# Patient Record
Sex: Female | Born: 1945 | Race: Black or African American | Hispanic: No | Smoking: Never smoker
Health system: Southern US, Community
[De-identification: ages and names within clinical notes are randomized; demographics above are authoritative.]

## PROBLEM LIST (undated history)

## (undated) DIAGNOSIS — H539 Unspecified visual disturbance: Secondary | ICD-10-CM

## (undated) DIAGNOSIS — R51 Headache: Secondary | ICD-10-CM

## (undated) DIAGNOSIS — M8580 Other specified disorders of bone density and structure, unspecified site: Secondary | ICD-10-CM

## (undated) DIAGNOSIS — R519 Headache, unspecified: Secondary | ICD-10-CM

## (undated) DIAGNOSIS — C50919 Malignant neoplasm of unspecified site of unspecified female breast: Secondary | ICD-10-CM

## (undated) DIAGNOSIS — E785 Hyperlipidemia, unspecified: Secondary | ICD-10-CM

## (undated) DIAGNOSIS — Z8489 Family history of other specified conditions: Secondary | ICD-10-CM

## (undated) DIAGNOSIS — M1990 Unspecified osteoarthritis, unspecified site: Secondary | ICD-10-CM

## (undated) HISTORY — PX: TRIGGER FINGER RELEASE: SHX641

## (undated) HISTORY — PX: MASTECTOMY: SHX3

## (undated) HISTORY — DX: Headache: R51

## (undated) HISTORY — PX: BACK SURGERY: SHX140

## (undated) HISTORY — PX: TUBAL LIGATION: SHX77

## (undated) HISTORY — DX: Unspecified visual disturbance: H53.9

## (undated) HISTORY — DX: Headache, unspecified: R51.9

## (undated) HISTORY — PX: BUNIONECTOMY: SHX129

## (undated) HISTORY — DX: Malignant neoplasm of unspecified site of unspecified female breast: C50.919

---

## 1998-01-18 ENCOUNTER — Observation Stay (HOSPITAL_COMMUNITY): Admission: RE | Admit: 1998-01-18 | Discharge: 1998-01-19 | Payer: Self-pay | Admitting: Orthopedic Surgery

## 1998-10-03 ENCOUNTER — Other Ambulatory Visit: Admission: RE | Admit: 1998-10-03 | Discharge: 1998-10-03 | Payer: Self-pay | Admitting: Obstetrics and Gynecology

## 1999-07-02 ENCOUNTER — Encounter: Admission: RE | Admit: 1999-07-02 | Discharge: 1999-07-02 | Payer: Self-pay | Admitting: Endocrinology

## 1999-07-02 ENCOUNTER — Encounter: Payer: Self-pay | Admitting: Endocrinology

## 1999-10-31 ENCOUNTER — Encounter (INDEPENDENT_AMBULATORY_CARE_PROVIDER_SITE_OTHER): Payer: Self-pay | Admitting: Specialist

## 1999-10-31 ENCOUNTER — Other Ambulatory Visit: Admission: RE | Admit: 1999-10-31 | Discharge: 1999-10-31 | Payer: Self-pay | Admitting: Obstetrics and Gynecology

## 2000-01-07 ENCOUNTER — Ambulatory Visit (HOSPITAL_COMMUNITY): Admission: RE | Admit: 2000-01-07 | Discharge: 2000-01-07 | Payer: Self-pay | Admitting: *Deleted

## 2000-07-02 ENCOUNTER — Encounter: Admission: RE | Admit: 2000-07-02 | Discharge: 2000-07-02 | Payer: Self-pay | Admitting: Endocrinology

## 2000-07-02 ENCOUNTER — Encounter: Payer: Self-pay | Admitting: Endocrinology

## 2000-12-01 ENCOUNTER — Other Ambulatory Visit: Admission: RE | Admit: 2000-12-01 | Discharge: 2000-12-01 | Payer: Self-pay | Admitting: Obstetrics and Gynecology

## 2001-07-05 ENCOUNTER — Encounter: Payer: Self-pay | Admitting: Endocrinology

## 2001-07-05 ENCOUNTER — Encounter: Admission: RE | Admit: 2001-07-05 | Discharge: 2001-07-05 | Payer: Self-pay | Admitting: Endocrinology

## 2002-05-11 ENCOUNTER — Encounter: Admission: RE | Admit: 2002-05-11 | Discharge: 2002-05-11 | Payer: Self-pay | Admitting: Endocrinology

## 2002-05-11 ENCOUNTER — Encounter: Payer: Self-pay | Admitting: Endocrinology

## 2002-07-07 ENCOUNTER — Encounter: Admission: RE | Admit: 2002-07-07 | Discharge: 2002-07-07 | Payer: Self-pay | Admitting: Obstetrics and Gynecology

## 2002-07-07 ENCOUNTER — Encounter: Payer: Self-pay | Admitting: Obstetrics and Gynecology

## 2002-07-14 ENCOUNTER — Encounter: Admission: RE | Admit: 2002-07-14 | Discharge: 2002-07-14 | Payer: Self-pay | Admitting: Obstetrics and Gynecology

## 2002-07-14 ENCOUNTER — Encounter: Payer: Self-pay | Admitting: Obstetrics and Gynecology

## 2003-07-13 ENCOUNTER — Encounter: Admission: RE | Admit: 2003-07-13 | Discharge: 2003-07-13 | Payer: Self-pay | Admitting: Obstetrics and Gynecology

## 2004-07-17 ENCOUNTER — Encounter: Admission: RE | Admit: 2004-07-17 | Discharge: 2004-07-17 | Payer: Self-pay | Admitting: Specialist

## 2005-06-05 ENCOUNTER — Ambulatory Visit (HOSPITAL_COMMUNITY): Admission: RE | Admit: 2005-06-05 | Discharge: 2005-06-05 | Payer: Self-pay | Admitting: *Deleted

## 2005-07-20 ENCOUNTER — Encounter: Admission: RE | Admit: 2005-07-20 | Discharge: 2005-07-20 | Payer: Self-pay | Admitting: Endocrinology

## 2006-07-23 ENCOUNTER — Encounter: Admission: RE | Admit: 2006-07-23 | Discharge: 2006-07-23 | Payer: Self-pay | Admitting: Family Medicine

## 2007-08-25 ENCOUNTER — Encounter: Admission: RE | Admit: 2007-08-25 | Discharge: 2007-08-25 | Payer: Self-pay | Admitting: Internal Medicine

## 2007-09-01 ENCOUNTER — Encounter: Admission: RE | Admit: 2007-09-01 | Discharge: 2007-09-01 | Payer: Self-pay | Admitting: Infectious Diseases

## 2008-10-05 ENCOUNTER — Encounter: Admission: RE | Admit: 2008-10-05 | Discharge: 2008-10-05 | Payer: Self-pay | Admitting: Infectious Diseases

## 2009-11-07 ENCOUNTER — Encounter: Admission: RE | Admit: 2009-11-07 | Discharge: 2009-11-07 | Payer: Self-pay | Admitting: Specialist

## 2010-02-23 ENCOUNTER — Encounter: Payer: Self-pay | Admitting: Endocrinology

## 2010-02-24 ENCOUNTER — Encounter: Payer: Self-pay | Admitting: Infectious Diseases

## 2010-06-20 NOTE — Op Note (Signed)
[name], [name]                ACCOUNT NO.:  [id number]   MEDICAL RECORD NO.:  [id number]          PATIENT TYPE:  AMB   LOCATION:  ENDO                         FACILITY:  [facility]   PHYSICIAN:  Georgiana Spinner, M.D.    DATE OF BIRTH:  12-08-1945   DATE OF PROCEDURE:  06/05/2005  DATE OF DISCHARGE:                                 OPERATIVE REPORT   PROCEDURE:  Colonoscopy.   INDICATIONS:  Colon cancer screening.   ANESTHESIA:  Demerol 100, Versed 9 mg.   PROCEDURE:  With the patient mildly sedated in the left lateral decubitus  position, the Olympus videoscopic colonoscope CF-160AL was inserted into the  rectum and passed under direct vision to the ascending colon. I could  advance it no further despite pressure and turning the patient so it was  withdrawn taking circumferential views of the colonic mucosa stopping in the  rectum which appeared normal on direct and retroflexed view. The endoscope  was then withdrawn and a PCF-160 pediatric endoscope was inserted and got to  the same point, again with pressure applied and the patient turned to her  back. This again was withdrawn taking circumferential views of the colonic  mucosa until we withdrew but I tried once more with the adult scope and with  pressure applied and patient turned, we were able to reach the cecum  identified by the ileocecal valve and appendiceal orifice both of which were  photographed. From this point, the colonoscope was again slowly withdrawn  taking circumferential views of the colonic mucosa stopping only in the  rectum which again appeared normal on direct view. I did not do another  retroflexed view. The endoscope was straightened and withdrawn.  The  patient's vital signs and pulse oximeter remained stable.  The patient  tolerated the procedure well without apparent complications.   FINDINGS:  Unremarkable examination of a very tortuous colon.   PLAN:  Consider repeat examination in 5-10 years.          ______________________________  Georgiana Spinner, M.D.     GMO/MEDQ  D:  06/05/2005  T:  06/06/2005  Job:  [id number]

## 2010-11-24 ENCOUNTER — Other Ambulatory Visit: Payer: Self-pay | Admitting: Specialist

## 2010-11-24 DIAGNOSIS — Z1231 Encounter for screening mammogram for malignant neoplasm of breast: Secondary | ICD-10-CM

## 2010-12-16 ENCOUNTER — Ambulatory Visit
Admission: RE | Admit: 2010-12-16 | Discharge: 2010-12-16 | Disposition: A | Discharge status: Home / Self Care | Payer: BC Managed Care – PPO | Source: Ambulatory Visit | Attending: Specialist | Admitting: Specialist

## 2010-12-16 ENCOUNTER — Other Ambulatory Visit: Payer: Self-pay | Admitting: Specialist

## 2010-12-16 DIAGNOSIS — Z1231 Encounter for screening mammogram for malignant neoplasm of breast: Secondary | ICD-10-CM

## 2011-11-26 ENCOUNTER — Other Ambulatory Visit: Payer: Self-pay | Admitting: Specialist

## 2011-11-26 DIAGNOSIS — Z1231 Encounter for screening mammogram for malignant neoplasm of breast: Secondary | ICD-10-CM

## 2011-11-26 DIAGNOSIS — Z9012 Acquired absence of left breast and nipple: Secondary | ICD-10-CM

## 2011-12-14 ENCOUNTER — Ambulatory Visit
Admission: RE | Admit: 2011-12-14 | Discharge: 2011-12-14 | Disposition: A | Discharge status: Home / Self Care | Payer: BC Managed Care – PPO | Source: Ambulatory Visit | Attending: Specialist | Admitting: Specialist

## 2011-12-14 DIAGNOSIS — Z9012 Acquired absence of left breast and nipple: Secondary | ICD-10-CM

## 2011-12-14 DIAGNOSIS — Z1231 Encounter for screening mammogram for malignant neoplasm of breast: Secondary | ICD-10-CM

## 2011-12-30 ENCOUNTER — Ambulatory Visit: Payer: BC Managed Care – PPO

## 2012-11-09 ENCOUNTER — Other Ambulatory Visit: Payer: Self-pay

## 2012-11-09 DIAGNOSIS — Z1231 Encounter for screening mammogram for malignant neoplasm of breast: Secondary | ICD-10-CM

## 2012-12-14 ENCOUNTER — Ambulatory Visit
Admission: RE | Admit: 2012-12-14 | Discharge: 2012-12-14 | Disposition: A | Discharge status: Home / Self Care | Payer: BC Managed Care – PPO | Source: Ambulatory Visit

## 2012-12-14 DIAGNOSIS — Z1231 Encounter for screening mammogram for malignant neoplasm of breast: Secondary | ICD-10-CM

## 2013-11-14 ENCOUNTER — Other Ambulatory Visit: Payer: Self-pay

## 2013-11-14 DIAGNOSIS — Z9012 Acquired absence of left breast and nipple: Secondary | ICD-10-CM

## 2013-11-14 DIAGNOSIS — Z1231 Encounter for screening mammogram for malignant neoplasm of breast: Secondary | ICD-10-CM

## 2013-12-18 ENCOUNTER — Encounter (INDEPENDENT_AMBULATORY_CARE_PROVIDER_SITE_OTHER): Payer: Self-pay

## 2013-12-18 ENCOUNTER — Ambulatory Visit
Admission: RE | Admit: 2013-12-18 | Discharge: 2013-12-18 | Disposition: A | Discharge status: Home / Self Care | Payer: Medicare Other | Source: Ambulatory Visit

## 2013-12-18 DIAGNOSIS — Z9012 Acquired absence of left breast and nipple: Secondary | ICD-10-CM

## 2013-12-18 DIAGNOSIS — Z1231 Encounter for screening mammogram for malignant neoplasm of breast: Secondary | ICD-10-CM

## 2014-10-10 ENCOUNTER — Other Ambulatory Visit: Payer: Self-pay | Admitting: Occupational Medicine

## 2014-10-10 ENCOUNTER — Ambulatory Visit: Payer: Self-pay

## 2014-10-10 DIAGNOSIS — M25512 Pain in left shoulder: Secondary | ICD-10-CM

## 2014-12-17 ENCOUNTER — Other Ambulatory Visit: Payer: Self-pay

## 2014-12-17 DIAGNOSIS — Z9012 Acquired absence of left breast and nipple: Secondary | ICD-10-CM

## 2014-12-17 DIAGNOSIS — Z1231 Encounter for screening mammogram for malignant neoplasm of breast: Secondary | ICD-10-CM

## 2014-12-20 ENCOUNTER — Ambulatory Visit
Admission: RE | Admit: 2014-12-20 | Discharge: 2014-12-20 | Disposition: A | Discharge status: Home / Self Care | Payer: BC Managed Care – PPO | Source: Ambulatory Visit

## 2014-12-20 DIAGNOSIS — Z9012 Acquired absence of left breast and nipple: Secondary | ICD-10-CM

## 2014-12-20 DIAGNOSIS — Z1231 Encounter for screening mammogram for malignant neoplasm of breast: Secondary | ICD-10-CM

## 2015-12-02 ENCOUNTER — Other Ambulatory Visit: Payer: Self-pay | Admitting: Endocrinology

## 2015-12-02 DIAGNOSIS — Z1231 Encounter for screening mammogram for malignant neoplasm of breast: Secondary | ICD-10-CM

## 2015-12-11 ENCOUNTER — Encounter (INDEPENDENT_AMBULATORY_CARE_PROVIDER_SITE_OTHER): Payer: Self-pay | Admitting: Family

## 2015-12-11 ENCOUNTER — Ambulatory Visit (INDEPENDENT_AMBULATORY_CARE_PROVIDER_SITE_OTHER): Payer: BC Managed Care – PPO | Admitting: Family

## 2015-12-11 ENCOUNTER — Telehealth (INDEPENDENT_AMBULATORY_CARE_PROVIDER_SITE_OTHER): Payer: Self-pay | Admitting: *Deleted

## 2015-12-11 ENCOUNTER — Ambulatory Visit (INDEPENDENT_AMBULATORY_CARE_PROVIDER_SITE_OTHER): Payer: BC Managed Care – PPO

## 2015-12-11 VITALS — Ht 66.0 in | Wt 200.0 lb

## 2015-12-11 DIAGNOSIS — M545 Low back pain, unspecified: Secondary | ICD-10-CM

## 2015-12-11 MED ORDER — PREDNISONE 10 MG (21) PO TBPK: 10.0000 mg | ORAL_TABLET | Freq: Every day | ORAL | 0 refills | Status: DC

## 2015-12-11 NOTE — Telephone Encounter (Signed)
Prescription was supposed to be sent to [facility] [place].

## 2015-12-11 NOTE — Progress Notes (Signed)
   Office Visit Note   Patient: Anna [name]           Date of Birth: 1945/10/26           MRN: [id number] Visit Date: 12/11/2015              Requested by: No referring provider defined for this encounter. PCP: No primary care provider on file.   Assessment & Plan: Visit Diagnoses:  1. Acute midline low back pain without sciatica     Plan: Have recommended she discontinue the ibuprofen and aleve as she recently had to start taking nexium due to increased acid reflux. Have called in a prescription for prednisone which she will take. She will follow up in the office in 4 more weeks. May use Tylenol for when necessary for pain.  Follow-Up Instructions: Return in about 4 weeks (around 01/08/2016).   Orders:  Orders Placed This Encounter  Procedures  . XR Lumbar Spine 2-3 Views   Meds ordered this encounter  Medications  . predniSONE (STERAPRED UNI-PAK 21 TAB) 10 MG (21) TBPK tablet    Sig: Take 1 tablet (10 mg total) by mouth daily.    Dispense:  21 tablet    Refill:  0      Procedures: No procedures performed   Clinical Data: No additional findings.   Subjective: Chief Complaint  Patient presents with  . Lower Back - Pain    X 1 day without injury    Patient is worked in today with complaints of acute onset of back pain. This is midline and does not radiate down the extremities. She doe snot report any known injury " it just started" and she is now having difficulty with walking due to the pain  Has been more active recently, and walks and does weights at the [facility]. No known injury. Does report mid back pain and describes this as a throbbing, radiates around the left abdomen and lower rib area. Denies any numbness or tingling.  Review of Systems  Constitutional: Negative for chills and fever.  Musculoskeletal: Positive for back pain and myalgias. Negative for gait problem.  All other systems reviewed and are negative.    Objective: Vital Signs: Ht 5\' 6"   (1.676 m)   Wt 200 lb (90.7 kg)   BMI 32.28 kg/m   Physical Exam  Musculoskeletal:       Thoracic back: She exhibits no bony tenderness and no spasm.       Lumbar back: Normal. She exhibits no bony tenderness and no spasm.  Negative straight leg raise bilaterally.   Physical Exam  Constitutional: He is oriented to person, place, and time. He appears well-developed and well-nourished.  Pulmonary/Chest: Effort normal.  Neurological: He is alert and oriented to person, place, and time.  Psychiatric: He has a normal mood and affect.  Nursing note reviewed.   Ortho Exam  Specialty Comments:  No specialty comments available.  Imaging: No results found.   PMFS History: There are no active problems to display for this patient.  No past medical history on file.  No family history on file.  No past surgical history on file. Social History   Occupational History  . Not on file.   Social History Main Topics  . Smoking status: Never Smoker  . Smokeless tobacco: Never Used  . Alcohol use Not on file  . Drug use: Unknown  . Sexual activity: Not on file

## 2015-12-12 ENCOUNTER — Telehealth (INDEPENDENT_AMBULATORY_CARE_PROVIDER_SITE_OTHER): Payer: Self-pay | Admitting: Orthopedic Surgery

## 2015-12-12 MED ORDER — PREDNISONE 10 MG (21) PO TBPK
10.0000 mg | ORAL_TABLET | Freq: Every day | ORAL | 0 refills | Status: DC
Start: 2015-12-12 — End: 2017-03-29

## 2015-12-12 NOTE — Telephone Encounter (Signed)
The pharmacy has transferred her prescription. Patient picked up yesterday.

## 2015-12-12 NOTE — Telephone Encounter (Signed)
Loren with [employer] called needing form faxed back to this fax number [phone]    Ph# [phone]    Please add order number on form  [id number]

## 2016-01-03 NOTE — Telephone Encounter (Signed)
This is complete and will be faxed to day.

## 2016-01-07 ENCOUNTER — Ambulatory Visit
Admission: RE | Admit: 2016-01-07 | Discharge: 2016-01-07 | Disposition: A | Discharge status: Home / Self Care | Payer: Medicare Other | Source: Ambulatory Visit | Attending: Endocrinology | Admitting: Endocrinology

## 2016-01-07 DIAGNOSIS — Z1231 Encounter for screening mammogram for malignant neoplasm of breast: Secondary | ICD-10-CM

## 2016-01-08 ENCOUNTER — Encounter (INDEPENDENT_AMBULATORY_CARE_PROVIDER_SITE_OTHER): Payer: Self-pay | Admitting: Family

## 2016-01-08 ENCOUNTER — Ambulatory Visit (INDEPENDENT_AMBULATORY_CARE_PROVIDER_SITE_OTHER): Payer: BC Managed Care – PPO | Admitting: Family

## 2016-01-08 VITALS — Ht 66.0 in | Wt 200.0 lb

## 2016-01-08 DIAGNOSIS — M546 Pain in thoracic spine: Secondary | ICD-10-CM

## 2016-01-08 NOTE — Progress Notes (Signed)
   Office Visit Note   Patient: Anna [name]           Date of Birth: 1945-11-22           MRN: [id number] Visit Date: 01/08/2016              Requested by: No referring provider defined for this encounter. PCP: No PCP Per Patient   Assessment & Plan: Visit Diagnoses:  1. Acute left-sided low back pain, with sciatica presence unspecified     Plan: Will return to the [facility]. Continue with home exercise program. She'll follow up with [place] as needed.  Follow-Up Instructions: Return if symptoms worsen or fail to improve.   Orders:  No orders of the defined types were placed in this encounter.  No orders of the defined types were placed in this encounter.     Procedures: No procedures performed   Clinical Data: No additional findings.   Subjective: Chief Complaint  Patient presents with  . Middle Back - Pain    Patient is a 70 year old woman who comes in today for follow up of " mid back pain." she states that the prednisone taper has worked well for her and she is no longer in pain. [facility]    Review of Systems  Constitutional: Negative for chills and fever.     Objective: Vital Signs: Ht 5\' 6"  (1.676 m)   Wt 200 lb (90.7 kg)   BMI 32.28 kg/m   Physical Exam  Constitutional: She is oriented to person, place, and time. She appears well-developed and well-nourished.  Pulmonary/Chest: Effort normal.  Musculoskeletal:       Thoracic back: She exhibits normal range of motion, no tenderness and no pain.  Neurological: She is alert and oriented to person, place, and time.  Psychiatric: She has a normal mood and affect.  Nursing note reviewed.   Ortho Exam  Specialty Comments:  No specialty comments available.  Imaging: No results found.   PMFS History: There are no active problems to display for this patient.  No past medical history on file.  No family history on file.  No past surgical history on  file. Social History   Occupational History  . Not on file.   Social History Main Topics  . Smoking status: Never Smoker  . Smokeless tobacco: Never Used  . Alcohol use Not on file  . Drug use: Unknown  . Sexual activity: Not on file

## 2016-01-10 ENCOUNTER — Telehealth (INDEPENDENT_AMBULATORY_CARE_PROVIDER_SITE_OTHER): Payer: Self-pay | Admitting: Orthopedic Surgery

## 2016-01-10 NOTE — Telephone Encounter (Signed)
Lauren from [employer] called checking that status of request for functional capacity eval and return to work note, says she has been waiting for 3 months for this.  Order #: [id number]  Cb#: [phone]

## 2016-01-13 ENCOUNTER — Telehealth (INDEPENDENT_AMBULATORY_CARE_PROVIDER_SITE_OTHER): Payer: Self-pay | Admitting: *Deleted

## 2016-01-13 NOTE — Telephone Encounter (Signed)
[employer] called again. Stated they have called multiple times and it has been over 90 days since they intially called and have not gotten a call back for pts disability.   reference number [id number]

## 2016-01-14 NOTE — Telephone Encounter (Signed)
I called this morning and let them know that the attending physicians statement had been faxed that we had not advised the pt to be out of work that we did not order an FCE and she had received an [employer] rating 10/01/15 with restrictions of no over head limiting of 25lbs. They stated they the office did not get this. I advised that I will fax this one note again for the work comp injury. To call with questions

## 2016-07-11 ENCOUNTER — Ambulatory Visit (HOSPITAL_COMMUNITY)
Admission: EM | Admit: 2016-07-11 | Discharge: 2016-07-11 | Disposition: A | Discharge status: Home / Self Care | Payer: Medicare Other | Attending: Internal Medicine | Admitting: Internal Medicine

## 2016-07-11 ENCOUNTER — Encounter (HOSPITAL_COMMUNITY): Payer: Self-pay | Admitting: Family Medicine

## 2016-07-11 DIAGNOSIS — S61211A Laceration without foreign body of left index finger without damage to nail, initial encounter: Secondary | ICD-10-CM

## 2016-07-11 DIAGNOSIS — Z23 Encounter for immunization: Secondary | ICD-10-CM | POA: Diagnosis not present

## 2016-07-11 DIAGNOSIS — W260XXA Contact with knife, initial encounter: Secondary | ICD-10-CM | POA: Diagnosis not present

## 2016-07-11 MED ORDER — TETANUS-DIPHTH-ACELL PERTUSSIS 5-2.5-18.5 LF-MCG/0.5 IM SUSP
0.5000 mL | Freq: Once | INTRAMUSCULAR | Status: AC
  Administered 2016-07-11: 0.5 mL via INTRAMUSCULAR

## 2016-07-11 MED ORDER — TETANUS-DIPHTH-ACELL PERTUSSIS 5-2.5-18.5 LF-MCG/0.5 IM SUSP
INTRAMUSCULAR | Status: AC
  Filled 2016-07-11: qty 0.5

## 2016-07-11 NOTE — ED Triage Notes (Signed)
Pt here for laceration to left index finger. sts she cut it this am cutting potatoes.

## 2016-07-11 NOTE — ED Provider Notes (Signed)
CSN: [id number]     Arrival date & time 07/11/16  1251 History   First MD Initiated Contact with Patient 07/11/16 1421     Chief Complaint  Patient presents with  . Laceration   (Consider location/radiation/quality/duration/timing/severity/associated sxs/prior Treatment) The history is provided by the patient.  Laceration  Location:  Finger Finger laceration location:  L index finger Length:  28mm Depth:  Cutaneous Quality: straight   Bleeding: controlled   Time since incident:  2 hours Laceration mechanism:  Knife Pain details:    Severity:  No pain Foreign body present:  No foreign bodies Tetanus status:  Unknown Associated symptoms: no fever, no focal weakness, no numbness, no rash, no redness, no swelling and no streaking     History reviewed. No pertinent past medical history. History reviewed. No pertinent surgical history. History reviewed. No pertinent family history. Social History  Substance Use Topics  . Smoking status: Never Smoker  . Smokeless tobacco: Never Used  . Alcohol use Not on file   OB History    No data available     Review of Systems  Constitutional: Negative for fever.       As stated in the HPI  Skin: Negative for rash.  Neurological: Negative for focal weakness.    Allergies  Sulfa antibiotics  Home Medications   Prior to Admission medications   Medication Sig Start Date End Date Taking? Authorizing Provider  esomeprazole (NEXIUM) 40 MG capsule Take 40 mg by mouth daily at 12 noon.    [provider]  levothyroxine (SYNTHROID, LEVOTHROID) 88 MCG tablet Take 88 mcg by mouth daily before breakfast.    [provider]  omeprazole (PRILOSEC) 20 MG capsule Take 20 mg by mouth daily.    [provider]  predniSONE (STERAPRED UNI-PAK 21 TAB) 10 MG (21) TBPK tablet Take 1 tablet (10 mg total) by mouth daily. Patient not taking: Reported on 01/08/2016 12/12/15   Suzan Slick, NP   Meds Ordered and Administered this  Visit   Medications  Tdap (BOOSTRIX) injection 0.5 mL (not administered)    BP (!) 102/59   Pulse 67   Temp 97.9 F (36.6 C)   Resp 18   SpO2 95%  No data found.   Physical Exam  Constitutional: She is oriented to person, place, and time. She appears well-developed and well-nourished.  Cardiovascular: Normal rate.   Pulmonary/Chest: Effort normal.  Neurological: She is alert and oriented to person, place, and time.  Skin:  Has a 8 mm linear superficial laceration to the left index finger. Bleeding controlled.   Psychiatric: She has a normal mood and affect.  Nursing note and vitals reviewed.   Urgent Care Course     .Marland KitchenLaceration Repair Date/Time: 07/11/2016 2:33 PM Performed by: Barry Dienes Authorized by: Sherlene Shams   Consent:    Consent obtained:  Verbal   Consent given by:  Patient   Risks discussed:  Need for additional repair and infection Anesthesia (see MAR for exact dosages):    Anesthesia method:  None Laceration details:    Location:  Finger   Finger location:  L index finger   Wound length (cm): 8 mm.   Laceration depth: 1 mm. Repair type:    Repair type:  Simple Pre-procedure details:    Preparation:  Patient was prepped and draped in usual sterile fashion Exploration:    Hemostasis achieved with:  Direct pressure   Wound exploration: wound explored through full range of motion  Wound extent: no foreign bodies/material noted, no muscle damage noted, no nerve damage noted and no vascular damage noted     Contaminated: no   Treatment:    Wound cleansed with: dermal wound cleanser.   Amount of cleaning:  Standard Skin repair:    Repair method:  Tissue adhesive Approximation:    Approximation:  Close   Vermilion border: well-aligned   Post-procedure details:    Dressing:  Adhesive bandage   Patient tolerance of procedure:  Tolerated well, no immediate complications   (including critical care time)  Labs Review Labs Reviewed - No data  to display  Imaging Review No results found.  MDM   1. Laceration of left index finger without foreign body without damage to nail, initial encounter    Laceration repaired: See procedure note above Tetanus booster given today Wound care discussed.      Barry Dienes, NP 07/11/16 1435

## 2016-08-13 ENCOUNTER — Ambulatory Visit (INDEPENDENT_AMBULATORY_CARE_PROVIDER_SITE_OTHER): Payer: Medicare Other | Admitting: Orthopedic Surgery

## 2016-08-13 ENCOUNTER — Ambulatory Visit (INDEPENDENT_AMBULATORY_CARE_PROVIDER_SITE_OTHER): Payer: Medicare Other

## 2016-08-13 ENCOUNTER — Encounter (INDEPENDENT_AMBULATORY_CARE_PROVIDER_SITE_OTHER): Payer: Self-pay | Admitting: Orthopedic Surgery

## 2016-08-13 VITALS — Ht 66.0 in | Wt 200.0 lb

## 2016-08-13 DIAGNOSIS — M79645 Pain in left finger(s): Secondary | ICD-10-CM

## 2016-08-13 DIAGNOSIS — M79672 Pain in left foot: Secondary | ICD-10-CM | POA: Diagnosis not present

## 2016-08-13 DIAGNOSIS — G8929 Other chronic pain: Secondary | ICD-10-CM | POA: Diagnosis not present

## 2016-08-13 DIAGNOSIS — M25551 Pain in right hip: Secondary | ICD-10-CM | POA: Diagnosis not present

## 2016-08-13 NOTE — Progress Notes (Signed)
Office Visit Note   Patient: Anna [name]           Date of Birth: [birth year]           MRN: [id number] Visit Date: 08/13/2016              Requested by: Anda Kraft, MD [address] [place], [place] [address] PCP: Anda Kraft, MD  Chief Complaint  Patient presents with  . Left Foot - Pain  . Left Thumb - Pain  . Right Hip - Pain  . Left Knee - Pain  . Right Knee - Pain  . Left Shoulder - Pain      HPI: Patient is a 71 year old woman who presents complaining of right hip groin pain worse with weightbearing pain here is anteriorly as well as posteriorly with movement. Patient states that she has had bilateral knee pain as well worsen the left than the right. Patient states that she still has shoulder pain status post arthroscopic debridement. Patient is having pain in the carpometacarpal joint of her left thumb and having pain in the left foot status post bunion surgery.  Assessment & Plan: Visit Diagnoses:  1. Pain in right hip   2. Pain in left foot   3. Chronic pain of left thumb     Plan: Recommended over-the-counter anti-inflammatories either ibuprofen 3 times a day or Aleve twice a day. Recommended exercise including strengthening and aerobic exercises such as swimming and bicycling to improve the strength in her lower extremities. Discussed the possibility of total knee arthroplasty for hip pain worsens.  Follow-Up Instructions: Return if symptoms worsen or fail to improve.   Ortho Exam  Patient is alert, oriented, no adenopathy, well-dressed, normal affect, normal respiratory effort. Examination patient has an antalgic gait. She has external rotation of 30 internal rotation of 10 of the right hip she has a negative straight leg raise. She has pain to palpation carpometacarpal joint left thumb. There is no redness no cellulitis no signs of infection no signs of gout. Examination she has decreased range of motion of the MTP joint of the left great  toe. There is no signs of infection.  Imaging: Xr Finger Thumb Left  Result Date: 08/13/2016 Three-view radiographs of the left hand shows joint space narrowing at the carpometacarpal joint left thumb.  Xr Foot Complete Left  Result Date: 08/13/2016 3 view radiographs left foot shows arthritic joint space narrowing of the MTP joint of the great toe with subcondylar sclerosis status post a silver bunionectomy.  Xr Hip Unilat W Or W/o Pelvis 2-3 Views Right  Result Date: 08/13/2016 Two-view radiographs of the right hip shows subcondylar sclerosis joint space narrowing irregular joint space with subcondylar cysts.   Labs: No results found for: HGBA1C, ESRSEDRATE, CRP, LABURIC, REPTSTATUS, GRAMSTAIN, CULT, LABORGA  Orders:  Orders Placed This Encounter  Procedures  . XR HIP UNILAT W OR W/O PELVIS 2-3 VIEWS RIGHT  . XR Foot Complete Left  . XR Finger Thumb Left   No orders of the defined types were placed in this encounter.    Procedures: No procedures performed  Clinical Data: No additional findings.  ROS:  All other systems negative, except as noted in the HPI. Review of Systems  Objective: Vital Signs: Ht 5\' 6"  (1.676 m)   Wt 200 lb (90.7 kg)   BMI 32.28 kg/m   Specialty Comments:  No specialty comments available.  PMFS History: There are no active problems to display for this  patient.  History reviewed. No pertinent past medical history.  History reviewed. No pertinent family history.  History reviewed. No pertinent surgical history. Social History   Occupational History  . Not on file.   Social History Main Topics  . Smoking status: Never Smoker  . Smokeless tobacco: Never Used  . Alcohol use Not on file  . Drug use: Unknown  . Sexual activity: Not on file

## 2016-09-21 DIAGNOSIS — E559 Vitamin D deficiency, unspecified: Secondary | ICD-10-CM | POA: Diagnosis not present

## 2016-09-21 DIAGNOSIS — E7800 Pure hypercholesterolemia, unspecified: Secondary | ICD-10-CM | POA: Diagnosis not present

## 2016-09-22 DIAGNOSIS — E039 Hypothyroidism, unspecified: Secondary | ICD-10-CM | POA: Diagnosis not present

## 2016-09-22 DIAGNOSIS — D509 Iron deficiency anemia, unspecified: Secondary | ICD-10-CM | POA: Diagnosis not present

## 2016-09-22 DIAGNOSIS — M859 Disorder of bone density and structure, unspecified: Secondary | ICD-10-CM | POA: Diagnosis not present

## 2016-09-22 DIAGNOSIS — E7800 Pure hypercholesterolemia, unspecified: Secondary | ICD-10-CM | POA: Diagnosis not present

## 2016-09-22 DIAGNOSIS — M8580 Other specified disorders of bone density and structure, unspecified site: Secondary | ICD-10-CM | POA: Diagnosis not present

## 2016-10-16 DIAGNOSIS — H524 Presbyopia: Secondary | ICD-10-CM | POA: Diagnosis not present

## 2016-10-16 DIAGNOSIS — H5203 Hypermetropia, bilateral: Secondary | ICD-10-CM | POA: Diagnosis not present

## 2016-10-16 DIAGNOSIS — H2513 Age-related nuclear cataract, bilateral: Secondary | ICD-10-CM | POA: Diagnosis not present

## 2016-10-16 DIAGNOSIS — H43813 Vitreous degeneration, bilateral: Secondary | ICD-10-CM | POA: Diagnosis not present

## 2016-11-26 ENCOUNTER — Other Ambulatory Visit: Payer: Self-pay | Admitting: Internal Medicine

## 2016-11-26 DIAGNOSIS — Z139 Encounter for screening, unspecified: Secondary | ICD-10-CM

## 2017-01-07 ENCOUNTER — Ambulatory Visit
Admission: RE | Admit: 2017-01-07 | Discharge: 2017-01-07 | Disposition: A | Discharge status: Home / Self Care | Payer: Medicare Other | Source: Ambulatory Visit | Attending: Internal Medicine | Admitting: Internal Medicine

## 2017-01-07 ENCOUNTER — Other Ambulatory Visit: Payer: Self-pay | Admitting: Internal Medicine

## 2017-01-07 DIAGNOSIS — Z139 Encounter for screening, unspecified: Secondary | ICD-10-CM

## 2017-03-23 DIAGNOSIS — R0789 Other chest pain: Secondary | ICD-10-CM | POA: Diagnosis not present

## 2017-03-23 DIAGNOSIS — R079 Chest pain, unspecified: Secondary | ICD-10-CM | POA: Diagnosis not present

## 2017-03-29 ENCOUNTER — Ambulatory Visit: Payer: Medicare Other | Admitting: Cardiology

## 2017-03-29 ENCOUNTER — Ambulatory Visit
Admission: RE | Admit: 2017-03-29 | Discharge: 2017-03-29 | Disposition: A | Discharge status: Home / Self Care | Payer: Medicare Other | Source: Ambulatory Visit | Attending: Cardiology | Admitting: Cardiology

## 2017-03-29 ENCOUNTER — Encounter: Payer: Self-pay | Admitting: Cardiology

## 2017-03-29 VITALS — BP 110/64 | HR 68 | Ht 66.0 in | Wt 198.8 lb

## 2017-03-29 DIAGNOSIS — R079 Chest pain, unspecified: Secondary | ICD-10-CM

## 2017-03-29 DIAGNOSIS — I700 Atherosclerosis of aorta: Secondary | ICD-10-CM | POA: Diagnosis not present

## 2017-03-29 DIAGNOSIS — M25512 Pain in left shoulder: Secondary | ICD-10-CM | POA: Diagnosis not present

## 2017-03-29 DIAGNOSIS — M25511 Pain in right shoulder: Secondary | ICD-10-CM

## 2017-03-29 NOTE — Patient Instructions (Signed)
Medication Instructions:  1. Your physician recommends that you continue on your current medications as directed. Please refer to the Current Medication list given to you today.   Labwork: NONE ORDERED TODAY  Testing/Procedures: 1. Your physician has requested that you have a stress echocardiogram. For further information please visit [email or web address]. Please follow instruction sheet as given.  2. A chest x-ray takes a picture of the organs and structures inside the chest, including the heart, lungs, and blood vessels. This test can show several things, including, whether the heart is enlarges; whether fluid is building up in the lungs; and whether pacemaker / defibrillator leads are still in place. TODAY AT [place]: AS NEEDED PENDING TEST RESULTS  Any Other Special Instructions Will Be Listed Below (If Applicable).     If you need a refill on your cardiac medications before your next appointment, please call your pharmacy.

## 2017-03-29 NOTE — Progress Notes (Signed)
03/29/2017 PALLAVI CLIFTON   09-Feb-1945  [id number]  Primary Physician Anda Kraft, MD Primary Cardiologist: New (Dr. Johnsie Cancel DOD)  Reason for Visit/CC: New Patient Evaluation for Right Scapular Pain   HPI:  Anna [name] is a 72 y.o. female who is being seen today for the evaluation of right scapular pain, at the request of Anda Kraft, MD.  Her husband is a pt of Dr. Johnsie Cancel . The patient herself has no prior cardiac history. Her only risk factor is HLD. She is on statin therapy with Pravastatin. There is no h/o HTN, DM, tobacco use nor family h/o CAD/SCD.   She started having right sided scapular pain about 1 month ago. Radiates to the right axilla. No anterior chest pain. Occurs off and on. Pressure like. Occurs in no particular pattern. Can occur at rest. Not related to exertion or meals. Not reproducible with movement or palpation. No recent heavy lifting or straining.   EKG at PCP office 03/23/17 reviewed. NSR without ischemic abnormalities.     Current Meds  Medication Sig  . Calcium 600-200 MG-UNIT tablet Take 2 tablets by mouth daily.  . ferrous sulfate (SLOW IRON) 160 (50 Fe) MG TBCR SR tablet Take 1 tablet by mouth daily.  . IBUPROFEN PO Take 800 mg tablet by mouth 1-3 times daily  . levothyroxine (SYNTHROID, LEVOTHROID) 88 MCG tablet Take 88 mcg by mouth daily before breakfast.  . omeprazole (PRILOSEC) 20 MG capsule Take 20 mg by mouth daily.  . pravastatin (PRAVACHOL) 40 MG tablet Take 40 mg by mouth daily.    Allergies  Allergen Reactions  . Sulfa Antibiotics    No past medical history on file. Family History  Problem Relation Age of Onset  . Hyperlipidemia Mother   . Hypertension Mother   . Alzheimer's disease Father   . Hypertension Sister   . Hypertension Sister   . Hypertension Sister    No past surgical history on file. Social History   Socioeconomic History  . Marital status: Married    Spouse name: Not on file  . Number of children: Not on file   . Years of education: Not on file  . Highest education level: Not on file  Social Needs  . Financial resource strain: Not on file  . Food insecurity - worry: Not on file  . Food insecurity - inability: Not on file  . Transportation needs - medical: Not on file  . Transportation needs - non-medical: Not on file  Occupational History  . Not on file  Tobacco Use  . Smoking status: Never Smoker  . Smokeless tobacco: Never Used  Substance and Sexual Activity  . Alcohol use: Not on file  . Drug use: Not on file  . Sexual activity: Not on file  Other Topics Concern  . Not on file  Social History Narrative  . Not on file     Review of Systems: General: negative for chills, fever, night sweats or weight changes.  Cardiovascular: negative for chest pain, dyspnea on exertion, edema, orthopnea, palpitations, paroxysmal nocturnal dyspnea or shortness of breath Dermatological: negative for rash Respiratory: negative for cough or wheezing Urologic: negative for hematuria Abdominal: negative for nausea, vomiting, diarrhea, bright red blood per rectum, melena, or hematemesis Neurologic: negative for visual changes, syncope, or dizziness All other systems reviewed and are otherwise negative except as noted above.   Physical Exam:  Blood pressure 110/64, pulse 68, height 5\' 6"  (1.676 m), weight 198 lb 12.8 oz (90.2 kg),  SpO2 97 %.  General appearance: alert, cooperative and no distress Neck: no carotid bruit and no JVD Lungs: clear to auscultation bilaterally Heart: regular rate and rhythm, S1, S2 normal, no murmur, click, rub or gallop Extremities: extremities normal, atraumatic, no cyanosis or edema Pulses: 2+ and symmetric Skin: Skin color, texture, turgor normal. No rashes or lesions Neurologic: Grossly normal  EKG NSR. No ischemic abnormalities -- personally reviewed   ASSESSMENT AND PLAN:   1. Right Scapular Pain: atypical features. EKG nonischemic. Symptoms not reproducible  with palpation. Shoulder exam negative. I've reviewed pt case with Dr. Johnsie Cancel DOD. He has recommended CXR as well as a stress echocardiogram to r/o ischemia.   Follow-Up TBD based on Stress Echo and CXR. If negative, she can f/u [facility] and return to see PCP for further management. If abnormal stress test, we will bring back for evaluation.   Tynell Winchell Ladoris Gene, MHS [facility] 03/29/2017 3:21 PM

## 2017-04-05 DIAGNOSIS — J069 Acute upper respiratory infection, unspecified: Secondary | ICD-10-CM | POA: Diagnosis not present

## 2017-04-16 ENCOUNTER — Telehealth (HOSPITAL_COMMUNITY): Payer: Self-pay | Admitting: *Deleted

## 2017-04-16 NOTE — Telephone Encounter (Signed)
Patient given detailed instructions per Stress Test Requisition Sheet for test on 04/20/17 at 2:30.Patient Notified to arrive 30 minutes early, and that it is imperative to arrive on time for appointment to keep from having the test rescheduled.  Patient verbalized understanding. Anna [name]

## 2017-04-20 ENCOUNTER — Ambulatory Visit (HOSPITAL_COMMUNITY): Payer: Medicare Other | Attending: Cardiology

## 2017-04-20 ENCOUNTER — Ambulatory Visit (HOSPITAL_COMMUNITY): Payer: Medicare Other

## 2017-04-20 DIAGNOSIS — R079 Chest pain, unspecified: Secondary | ICD-10-CM | POA: Diagnosis not present

## 2017-04-20 DIAGNOSIS — E785 Hyperlipidemia, unspecified: Secondary | ICD-10-CM | POA: Diagnosis not present

## 2017-04-22 NOTE — Progress Notes (Signed)
Pt has been made aware of normal result and verbalized understanding.  jw 04/22/17

## 2017-07-08 DIAGNOSIS — M25512 Pain in left shoulder: Secondary | ICD-10-CM | POA: Diagnosis not present

## 2017-07-08 DIAGNOSIS — E7800 Pure hypercholesterolemia, unspecified: Secondary | ICD-10-CM | POA: Diagnosis not present

## 2017-07-08 DIAGNOSIS — E039 Hypothyroidism, unspecified: Secondary | ICD-10-CM | POA: Diagnosis not present

## 2017-08-03 DIAGNOSIS — R51 Headache: Secondary | ICD-10-CM | POA: Diagnosis not present

## 2017-08-11 ENCOUNTER — Ambulatory Visit: Payer: Medicare Other | Admitting: Neurology

## 2017-08-11 ENCOUNTER — Other Ambulatory Visit: Payer: Self-pay

## 2017-08-11 ENCOUNTER — Encounter: Payer: Self-pay | Admitting: Neurology

## 2017-08-11 VITALS — BP 119/72 | HR 71 | Resp 16 | Ht 66.0 in | Wt 207.0 lb

## 2017-08-11 DIAGNOSIS — G43709 Chronic migraine without aura, not intractable, without status migrainosus: Secondary | ICD-10-CM

## 2017-08-11 DIAGNOSIS — IMO0002 Reserved for concepts with insufficient information to code with codable children: Secondary | ICD-10-CM | POA: Insufficient documentation

## 2017-08-11 MED ORDER — SUMATRIPTAN SUCCINATE 25 MG PO TABS: 25.0000 mg | ORAL_TABLET | ORAL | 3 refills | Status: DC | PRN

## 2017-08-11 NOTE — Progress Notes (Signed)
PATIENT: Anna [name] DOB: 1945-07-28  Chief Complaint  Patient presents with  . Headache    Rm. 4.  PCP: [name].  Anna [name] is here for eval of h/a's, onset about a yr. ago, worse in the last 2 mos. H/A's are primarly left sided, not associated with n/v, light, sound or smell sensitivity.  Not able to identify triggers. Some relief with Ibuprofen and rest/fim     HISTORICAL  Anna [name] is a 72 years old female, seen in refer by her primary care physician Dr.Walter Shelia Media for evaluation of headaches, initial evaluation was on August 11, 2017.  She has past medical history of hypothyroidism, on supplement, hyperlipidemia, reported a history of headaches for many years, intermittent, usually triggered by bright light, missing her meals, exertion.  Since May 2019, she reported increased frequency of headaches, 2-3 times each week, left retro-orbital area headaches, often associated with left neck tension, she is suffering left shoulder pain, the tension of from her left shoulder often exacerbated to her left side neck muscle spasm, headaches as well,  She has been taking 800 mg ibuprofen frequently, sometimes 2-3 times each week. she denies focal motor or sensory deficit.  REVIEW OF SYSTEMS: Full 14 system review of systems performed and notable only for as above  ALLERGIES: Allergies  Allergen Reactions  . Sulfa Antibiotics     HOME MEDICATIONS: Current Outpatient Medications  Medication Sig Dispense Refill  . Calcium 600-200 MG-UNIT tablet Take 2 tablets by mouth daily.    . ferrous sulfate (SLOW IRON) 160 (50 Fe) MG TBCR SR tablet Take 1 tablet by mouth daily.    . IBUPROFEN PO Take 800 mg tablet by mouth 1-3 times daily    . levothyroxine (SYNTHROID, LEVOTHROID) 88 MCG tablet Take 88 mcg by mouth daily before breakfast.    . omeprazole (PRILOSEC) 20 MG capsule Take 20 mg by mouth daily.    . pravastatin (PRAVACHOL) 40 MG tablet Take 40 mg by mouth daily.      No  current facility-administered medications for this visit.     PAST MEDICAL HISTORY: Past Medical History:  Diagnosis Date  . Breast cancer [facility])    age 77  . Headache   . Vision abnormalities     PAST SURGICAL HISTORY: Past Surgical History:  Procedure Laterality Date  . BACK SURGERY     lumbar region  . BUNIONECTOMY Bilateral   . MASTECTOMY Left    at age 30  . TRIGGER FINGER RELEASE Left    left thumb  . TUBAL LIGATION      FAMILY HISTORY: Family History  Problem Relation Age of Onset  . Hyperlipidemia Mother   . Hypertension Mother   . Alzheimer's disease Father   . Hypertension Sister   . Hypertension Sister   . Hypertension Sister   . Hypertension Brother   . Lung cancer Brother   . Hypertension Sister   . Hypertension Brother     SOCIAL HISTORY:  Social History   Socioeconomic History  . Marital status: Married    Spouse name: Not on file  . Number of children: Not on file  . Years of education: Not on file  . Highest education level: Not on file  Occupational History  . Not on file  Social Needs  . Financial resource strain: Not on file  . Food insecurity:    Worry: Not on file    Inability: Not on file  . Transportation needs:  Medical: Not on file    Non-medical: Not on file  Tobacco Use  . Smoking status: Never Smoker  . Smokeless tobacco: Never Used  Substance and Sexual Activity  . Alcohol use: Not on file  . Drug use: Not on file  . Sexual activity: Not on file  Lifestyle  . Physical activity:    Days per week: Not on file    Minutes per session: Not on file  . Stress: Not on file  Relationships  . Social connections:    Talks on phone: Not on file    Gets together: Not on file    Attends religious service: Not on file    Active member of club or organization: Not on file    Attends meetings of clubs or organizations: Not on file    Relationship status: Not on file  . Intimate partner violence:    Fear of current or ex  partner: Not on file    Emotionally abused: Not on file    Physically abused: Not on file    Forced sexual activity: Not on file  Other Topics Concern  . Not on file  Social History Narrative  . Not on file     PHYSICAL EXAM   Vitals:   08/11/17 0714  BP: 119/72  Pulse: 71  Resp: 16  Weight: 207 lb (93.9 kg)  Height: 5' 6" (1.676 m)    Not recorded      Body mass index is 33.41 kg/m.  PHYSICAL EXAMNIATION:  Gen: NAD, conversant, well nourised, obese, well groomed                     Cardiovascular: Regular rate rhythm, no peripheral edema, warm, nontender. Eyes: Conjunctivae clear without exudates or hemorrhage Neck: Supple, no carotid bruits. Pulmonary: Clear to auscultation bilaterally   NEUROLOGICAL EXAM:  MENTAL STATUS: Speech:    Speech is normal; fluent and spontaneous with normal comprehension.  Cognition:     Orientation to time, place and person     Normal recent and remote memory     Normal Attention span and concentration     Normal Language, naming, repeating,spontaneous speech     Fund of knowledge   CRANIAL NERVES: CN II: Visual fields are full to confrontation. Fundoscopic exam is normal with sharp discs and no vascular changes. Pupils are round equal and briskly reactive to light. CN III, IV, VI: extraocular movement are normal. No ptosis. CN V: Facial sensation is intact to pinprick in all 3 divisions bilaterally. Corneal responses are intact.  CN VII: Face is symmetric with normal eye closure and smile. CN VIII: Hearing is normal to rubbing fingers CN IX, X: Palate elevates symmetrically. Phonation is normal. CN XI: Head turning and shoulder shrug are intact CN XII: Tongue is midline with normal movements and no atrophy.  MOTOR: Limited range of motion of left shoulder, weakness,  REFLEXES: Reflexes are 2+ and symmetric at the biceps, triceps, knees, and ankles. Plantar responses are flexor.  SENSORY: Intact to light touch, pinprick,  positional sensation and vibratory sensation are intact in fingers and toes.  COORDINATION: Rapid alternating movements and fine finger movements are intact. There is no dysmetria on finger-to-nose and heel-knee-shin.    GAIT/STANCE: Posture is normal. Gait is steady with normal steps, base, arm swing, and turning. Heel and toe walking are normal. Tandem gait is normal.  Romberg is absent.   DIAGNOSTIC DATA (LABS, IMAGING, TESTING) - I reviewed patient records, labs,  notes, testing and imaging myself where available.   ASSESSMENT AND PLAN  LEAHA CUERVO is a 72 y.o. 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 Ph.D.  [facility] [address] [place], [place] [address] Ph: [phone] Fax: [phone]  CC: Referring Provider

## 2017-08-12 ENCOUNTER — Telehealth: Payer: Self-pay | Admitting: *Deleted

## 2017-08-12 LAB — TSH: TSH: 1.02 u[IU]/mL (ref 0.450–4.500)

## 2017-08-12 LAB — COMPREHENSIVE METABOLIC PANEL
ALK PHOS: 72 IU/L (ref 39–117)
ALT: 11 IU/L (ref 0–32)
AST: 13 IU/L (ref 0–40)
Albumin/Globulin Ratio: 1.6 (ref 1.2–2.2)
Albumin: 4.2 g/dL (ref 3.6–4.8)
BILIRUBIN TOTAL: 0.4 mg/dL (ref 0.0–1.2)
BUN/Creatinine Ratio: 20 (ref 12–28)
BUN: 21 mg/dL (ref 8–27)
CHLORIDE: 105 mmol/L (ref 96–106)
CO2: 25 mmol/L (ref 20–29)
CREATININE: 1.07 mg/dL — AB (ref 0.57–1.00)
Calcium: 9.6 mg/dL (ref 8.7–10.3)
GFR calc Af Amer: 61 mL/min/{1.73_m2} (ref >59)
GFR calc non Af Amer: 53 mL/min/{1.73_m2} — ABNORMAL LOW (ref >59)
GLUCOSE: 97 mg/dL (ref 65–99)
Globulin, Total: 2.6 g/dL (ref 1.5–4.5)
Potassium: 3.7 mmol/L (ref 3.5–5.2)
Sodium: 145 mmol/L — ABNORMAL HIGH (ref 134–144)
Total Protein: 6.8 g/dL (ref 6.0–8.5)

## 2017-08-12 LAB — SEDIMENTATION RATE: SED RATE: 7 mm/h (ref 0–40)

## 2017-08-12 LAB — CBC
HEMATOCRIT: 37.4 % (ref 34.0–46.6)
Hemoglobin: 11.9 g/dL (ref 11.1–15.9)
MCH: 30.3 pg (ref 26.6–33.0)
MCHC: 31.8 g/dL (ref 31.5–35.7)
MCV: 95 fL (ref 79–97)
PLATELETS: 215 10*3/uL (ref 150–450)
RBC: 3.93 x10E6/uL (ref 3.77–5.28)
RDW: 13.9 % (ref 12.3–15.4)
WBC: 4.7 10*3/uL (ref 3.4–10.8)

## 2017-08-12 LAB — C-REACTIVE PROTEIN

## 2017-08-12 NOTE — Telephone Encounter (Signed)
Spoke to patient - she is aware of results and will stay well hydrated.

## 2017-08-12 NOTE — Telephone Encounter (Signed)
-----   Message from [name], MD sent at 08/12/2017  4:36 PM EDT ----- Please call patient, creat was mildly elevated 1.07, she should keep well hydration, rest of lab showed no significant abnormalities.

## 2017-09-20 DIAGNOSIS — E789 Disorder of lipoprotein metabolism, unspecified: Secondary | ICD-10-CM | POA: Diagnosis not present

## 2017-09-20 DIAGNOSIS — E039 Hypothyroidism, unspecified: Secondary | ICD-10-CM | POA: Diagnosis not present

## 2017-09-20 DIAGNOSIS — E7800 Pure hypercholesterolemia, unspecified: Secondary | ICD-10-CM | POA: Diagnosis not present

## 2017-09-20 DIAGNOSIS — E559 Vitamin D deficiency, unspecified: Secondary | ICD-10-CM | POA: Diagnosis not present

## 2017-09-20 DIAGNOSIS — E034 Atrophy of thyroid (acquired): Secondary | ICD-10-CM | POA: Diagnosis not present

## 2017-09-27 DIAGNOSIS — Z0000 Encounter for general adult medical examination without abnormal findings: Secondary | ICD-10-CM | POA: Diagnosis not present

## 2017-09-27 DIAGNOSIS — E7800 Pure hypercholesterolemia, unspecified: Secondary | ICD-10-CM | POA: Diagnosis not present

## 2017-09-27 DIAGNOSIS — K219 Gastro-esophageal reflux disease without esophagitis: Secondary | ICD-10-CM | POA: Diagnosis not present

## 2017-09-27 DIAGNOSIS — E039 Hypothyroidism, unspecified: Secondary | ICD-10-CM | POA: Diagnosis not present

## 2017-10-18 DIAGNOSIS — H25012 Cortical age-related cataract, left eye: Secondary | ICD-10-CM | POA: Diagnosis not present

## 2017-10-18 DIAGNOSIS — H2513 Age-related nuclear cataract, bilateral: Secondary | ICD-10-CM | POA: Diagnosis not present

## 2017-10-18 DIAGNOSIS — H43813 Vitreous degeneration, bilateral: Secondary | ICD-10-CM | POA: Diagnosis not present

## 2017-10-18 DIAGNOSIS — H524 Presbyopia: Secondary | ICD-10-CM | POA: Diagnosis not present

## 2017-10-20 ENCOUNTER — Other Ambulatory Visit: Payer: Self-pay | Admitting: Internal Medicine

## 2017-10-20 DIAGNOSIS — Z1231 Encounter for screening mammogram for malignant neoplasm of breast: Secondary | ICD-10-CM

## 2017-11-13 ENCOUNTER — Ambulatory Visit (HOSPITAL_COMMUNITY)
Admission: EM | Admit: 2017-11-13 | Discharge: 2017-11-13 | Disposition: A | Discharge status: Home / Self Care | Payer: Medicare Other | Attending: Family Medicine | Admitting: Family Medicine

## 2017-11-13 ENCOUNTER — Encounter (HOSPITAL_COMMUNITY): Payer: Self-pay

## 2017-11-13 ENCOUNTER — Other Ambulatory Visit: Payer: Self-pay

## 2017-11-13 DIAGNOSIS — S61213A Laceration without foreign body of left middle finger without damage to nail, initial encounter: Secondary | ICD-10-CM | POA: Diagnosis not present

## 2017-11-13 NOTE — ED Provider Notes (Signed)
  [place]   [id number] 11/13/17 Arrival Time: 1212  ASSESSMENT & PLAN:  1. Laceration of left middle finger without foreign body without damage to nail, initial encounter    No sutures needed. Discussed placing pressure dressing for her to remove in the morning. Should heal without issue. Keep clean and dry.   Discharge Instructions     Gently remove the bandage on your finger tomorrow. If having continuing problems with bleeding, we would be happy to see you tomorrow.   OTC analgesics if needed. Reviewed expectations re: course of current medical issues. Questions answered. Outlined signs and symptoms indicating need for more acute intervention. Patient verbalized understanding. After Visit Summary given.   SUBJECTIVE:  Anna [name] is a 72 y.o. female who presents with a laceration of her distal L 3rd finger a few hours ago. Moderate bleeding. Cut with knife at home during food preparation. Minimal discomfort. No extremity sensation changes or weakness. Nail not involved. Washed copiously at home. Not currently taking any blood thinning medications.  Td UTD: Unsure. Prefers to f/u with PCP.  ROS: As per HPI.   OBJECTIVE:  Vitals:   11/13/17 1234  BP: 109/73  Pulse: 72  Resp: 17  Temp: 98 F (36.7 C)  TempSrc: Oral  SpO2: 98%    General appearance: alert; no distress Ext: laceration of distal L 3rd finger just lateral to nail fold; nail intact; moderate bleeding that stops with pressure; size: approx 1 cm; L 3rd finger with FROM, normal strength, and brisk capillary refill; no swelling; normal distal sensation of L 3rd finger Psychological: alert and cooperative; normal mood and affect    Allergies  Allergen Reactions  . Sulfa Antibiotics     Past Medical History:  Diagnosis Date  . Breast cancer [facility])    age 28  . Headache   . Vision abnormalities    Social History   Socioeconomic History  . Marital status: Married    Spouse name:  Not on file  . Number of children: Not on file  . Years of education: Not on file  . Highest education level: Not on file  Occupational History  . Not on file  Social Needs  . Financial resource strain: Not on file  . Food insecurity:    Worry: Not on file    Inability: Not on file  . Transportation needs:    Medical: Not on file    Non-medical: Not on file  Tobacco Use  . Smoking status: Never Smoker  . Smokeless tobacco: Never Used  Substance and Sexual Activity  . Alcohol use: Never    Frequency: Never  . Drug use: Never  . Sexual activity: Not Currently  Lifestyle  . Physical activity:    Days per week: Not on file    Minutes per session: Not on file  . Stress: Not on file  Relationships  . Social connections:    Talks on phone: Not on file    Gets together: Not on file    Attends religious service: Not on file    Active member of club or organization: Not on file    Attends meetings of clubs or organizations: Not on file    Relationship status: Not on file  Other Topics Concern  . Not on file  Social History Narrative  . Not on file         Vanessa Kick, MD 11/13/17 1315

## 2017-11-13 NOTE — Discharge Instructions (Addendum)
Gently remove the bandage on your finger tomorrow. If having continuing problems with bleeding, we would be happy to see you tomorrow.

## 2017-11-13 NOTE — ED Triage Notes (Signed)
Pt presents to [facility] for middle finger laceration on left hand this morning while cooking

## 2017-11-15 ENCOUNTER — Ambulatory Visit: Payer: Medicare Other | Admitting: Neurology

## 2017-11-15 ENCOUNTER — Encounter: Payer: Self-pay | Admitting: Neurology

## 2017-11-15 VITALS — BP 118/65 | HR 71 | Ht 66.0 in | Wt 213.0 lb

## 2017-11-15 DIAGNOSIS — G43709 Chronic migraine without aura, not intractable, without status migrainosus: Secondary | ICD-10-CM | POA: Diagnosis not present

## 2017-11-15 DIAGNOSIS — IMO0002 Reserved for concepts with insufficient information to code with codable children: Secondary | ICD-10-CM

## 2017-11-15 NOTE — Progress Notes (Signed)
PATIENT: Anna [name] DOB: Nov 27, 1945  Chief Complaint  Patient presents with  . Follow-up    PCP: Dr. [name]. Patient is here alone.   . Migraine    She reports that her migraines have been doing well.      HISTORICAL  Anna [name] is a 72 years old female, seen in refer by her primary care physician Dr.Walter Shelia Media for evaluation of headaches, initial evaluation was on August 11, 2017.  She has past medical history of hypothyroidism, on supplement, hyperlipidemia, reported a history of headaches for many years, intermittent, usually triggered by bright light, missing her meals, exertion.  Since May 2019, she reported increased frequency of headaches, 2-3 times each week, left retro-orbital area headaches, often associated with left neck tension, she is suffering left shoulder pain, the tension of from her left shoulder often exacerbated to her left side neck muscle spasm, headaches as well, She has been taking 800 mg ibuprofen frequently, sometimes 2-3 times each week. she denies focal motor or sensory deficit.  UPDATE Nov 15 2017: Laboratory evaluations on August 11, 2017 showed normal negative ESR, C-reactive protein, TSH, CBC, CMP showed mild elevated decreased GFR of 53  Her headache overall has much improved, she only had few major migraine in past few months, she has taken imitrex 43m, took away headache in 30 minutes  REVIEW OF SYSTEMS: Full 14 system review of systems performed and notable only for as above  ALLERGIES: Allergies  Allergen Reactions  . Sulfa Antibiotics     HOME MEDICATIONS: Current Outpatient Medications  Medication Sig Dispense Refill  . Calcium 600-200 MG-UNIT tablet Take 2 tablets by mouth daily.    . ferrous sulfate (SLOW IRON) 160 (50 Fe) MG TBCR SR tablet Take 1 tablet by mouth daily.    . IBUPROFEN PO Take 800 mg tablet by mouth 1-3 times daily    . levothyroxine (SYNTHROID, LEVOTHROID) 88 MCG tablet Take 88 mcg by mouth daily  before breakfast.    . omeprazole (PRILOSEC) 20 MG capsule Take 20 mg by mouth daily.    . pravastatin (PRAVACHOL) 40 MG tablet Take 40 mg by mouth daily.     . SUMAtriptan (IMITREX) 25 MG tablet Take 1 tablet (25 mg total) by mouth every 2 (two) hours as needed for migraine. May repeat in 2 hours if headache persists or recurs. 12 tablet 3   No current facility-administered medications for this visit.     PAST MEDICAL HISTORY: Past Medical History:  Diagnosis Date  . Breast cancer ([facility]    age 72 . Headache   . Vision abnormalities     PAST SURGICAL HISTORY: Past Surgical History:  Procedure Laterality Date  . BACK SURGERY     lumbar region  . BUNIONECTOMY Bilateral   . MASTECTOMY Left    at age 72 . TRIGGER FINGER RELEASE Left    left thumb  . TUBAL LIGATION      FAMILY HISTORY: Family History  Problem Relation Age of Onset  . Hyperlipidemia Mother   . Hypertension Mother   . Alzheimer's disease Father   . Hypertension Sister   . Hypertension Sister   . Hypertension Sister   . Hypertension Brother   . Lung cancer Brother   . Hypertension Sister   . Hypertension Brother     SOCIAL HISTORY:  Social History   Socioeconomic History  . Marital status: Married    Spouse name: Not on file  .  Number of children: Not on file  . Years of education: Not on file  . Highest education level: Not on file  Occupational History  . Not on file  Social Needs  . Financial resource strain: Not on file  . Food insecurity:    Worry: Not on file    Inability: Not on file  . Transportation needs:    Medical: Not on file    Non-medical: Not on file  Tobacco Use  . Smoking status: Never Smoker  . Smokeless tobacco: Never Used  Substance and Sexual Activity  . Alcohol use: Never    Frequency: Never  . Drug use: Never  . Sexual activity: Not Currently  Lifestyle  . Physical activity:    Days per week: Not on file    Minutes per session: Not on file  . Stress: Not  on file  Relationships  . Social connections:    Talks on phone: Not on file    Gets together: Not on file    Attends religious service: Not on file    Active member of club or organization: Not on file    Attends meetings of clubs or organizations: Not on file    Relationship status: Not on file  . Intimate partner violence:    Fear of current or ex partner: Not on file    Emotionally abused: Not on file    Physically abused: Not on file    Forced sexual activity: Not on file  Other Topics Concern  . Not on file  Social History Narrative  . Not on file     PHYSICAL EXAM   Vitals:   11/15/17 0928  BP: 118/65  Pulse: 71  Weight: 213 lb (96.6 kg)  Height: '5\' 6"'  (1.676 m)    Not recorded      Body mass index is 34.38 kg/m.  PHYSICAL EXAMNIATION:  Gen: NAD, conversant, well nourised, obese, well groomed                     Cardiovascular: Regular rate rhythm, no peripheral edema, warm, nontender. Eyes: Conjunctivae clear without exudates or hemorrhage Neck: Supple, no carotid bruits. Pulmonary: Clear to auscultation bilaterally   NEUROLOGICAL EXAM:  MENTAL STATUS: Speech:    Speech is normal; fluent and spontaneous with normal comprehension.  Cognition:     Orientation to time, place and person     Normal recent and remote memory     Normal Attention span and concentration     Normal Language, naming, repeating,spontaneous speech     Fund of knowledge   CRANIAL NERVES: CN II: Visual fields are full to confrontation. Fundoscopic exam is normal with sharp discs and no vascular changes. Pupils are round equal and briskly reactive to light. CN III, IV, VI: extraocular movement are normal. No ptosis. CN V: Facial sensation is intact to pinprick in all 3 divisions bilaterally. Corneal responses are intact.  CN VII: Face is symmetric with normal eye closure and smile. CN VIII: Hearing is normal to rubbing fingers CN IX, X: Palate elevates symmetrically. Phonation  is normal. CN XI: Head turning and shoulder shrug are intact CN XII: Tongue is midline with normal movements and no atrophy.  MOTOR: Limited range of motion of left shoulder, weakness,  REFLEXES: Reflexes are 2+ and symmetric at the biceps, triceps, knees, and ankles. Plantar responses are flexor.  SENSORY: Intact to light touch, pinprick, positional sensation and vibratory sensation are intact in fingers and toes.  COORDINATION: Rapid alternating movements and fine finger movements are intact. There is no dysmetria on finger-to-nose and heel-knee-shin.    GAIT/STANCE: Posture is normal. Gait is steady with normal steps, base, arm swing, and turning. Heel and toe walking are normal. Tandem gait is normal.  Romberg is absent.   DIAGNOSTIC DATA (LABS, IMAGING, TESTING) - I reviewed patient records, labs, notes, testing and imaging myself where available.   ASSESSMENT AND PLAN  ALEXANDRIA SHIFLETT is a 72 y.o. female   Chronic migraine  Continue low-dose of Imitrex as needed   [name], M.D. Ph.D.  [facility] [address] [place], [place] [address] Ph: [phone] Fax: [phone]  CC: Referring Provider

## 2017-11-17 ENCOUNTER — Ambulatory Visit (HOSPITAL_COMMUNITY)
Admission: EM | Admit: 2017-11-17 | Discharge: 2017-11-17 | Disposition: A | Discharge status: Home / Self Care | Payer: Medicare Other | Attending: Family Medicine | Admitting: Family Medicine

## 2017-11-17 ENCOUNTER — Encounter (HOSPITAL_COMMUNITY): Payer: Self-pay | Admitting: Emergency Medicine

## 2017-11-17 ENCOUNTER — Other Ambulatory Visit: Payer: Self-pay

## 2017-11-17 DIAGNOSIS — Z5189 Encounter for other specified aftercare: Secondary | ICD-10-CM

## 2017-11-17 DIAGNOSIS — S61213D Laceration without foreign body of left middle finger without damage to nail, subsequent encounter: Secondary | ICD-10-CM | POA: Diagnosis not present

## 2017-11-17 NOTE — ED Provider Notes (Signed)
  [place]   [id number] 11/17/17 Arrival Time: 5400  ASSESSMENT & PLAN:  1. Visit for wound check    Continue with loose Band-Aid as needed. Should continue to heal well. May f/u here with any concerns.   Discharge Instructions     Apply a Band-Aid as needed.   Reviewed expectations re: course of current medical issues. Questions answered. Outlined signs and symptoms indicating need for more acute intervention. Patient verbalized understanding. After Visit Summary given.   SUBJECTIVE:  Anna [name] is a 72 y.o. female who presents for follow up concerning a small laceration to her distal L 3rd finger. Has been putting a dressing on this daily with coban for pressure. When she removes bandage the wound oozes some. No pain. No erythema. No distal finger sensation changes.   ROS: As per HPI.   OBJECTIVE:  Vitals:   11/17/17 0954  BP: (!) 103/58  Pulse: 70  Resp: 18  Temp: 98.1 F (36.7 C)  TempSrc: Oral  SpO2: 98%    General appearance: alert; no distress Skin: L 3rd finger laceration is healing with approximated edges; no bleeding while in office; normal L 3rd finger capillary refill and distal sensation; no signs of infection; FROM Psychological: alert and cooperative; normal mood and affect   Allergies  Allergen Reactions  . Sulfa Antibiotics     Past Medical History:  Diagnosis Date  . Breast cancer [facility])    age 31  . Headache   . Vision abnormalities    Social History   Socioeconomic History  . Marital status: Married    Spouse name: Not on file  . Number of children: Not on file  . Years of education: Not on file  . Highest education level: Not on file  Occupational History  . Not on file  Social Needs  . Financial resource strain: Not on file  . Food insecurity:    Worry: Not on file    Inability: Not on file  . Transportation needs:    Medical: Not on file    Non-medical: Not on file  Tobacco Use  . Smoking status:  Never Smoker  . Smokeless tobacco: Never Used  Substance and Sexual Activity  . Alcohol use: Never    Frequency: Never  . Drug use: Never  . Sexual activity: Not Currently  Lifestyle  . Physical activity:    Days per week: Not on file    Minutes per session: Not on file  . Stress: Not on file  Relationships  . Social connections:    Talks on phone: Not on file    Gets together: Not on file    Attends religious service: Not on file    Active member of club or organization: Not on file    Attends meetings of clubs or organizations: Not on file    Relationship status: Not on file  Other Topics Concern  . Not on file  Social History Narrative  . Not on file         Vanessa Kick, MD 11/17/17 1045

## 2017-11-17 NOTE — ED Triage Notes (Signed)
Seen 10/12 .  Patient says left middle finger is still bleeding and reports a dark spot on wound.  Wound is currently wrapped in coban

## 2017-11-17 NOTE — Discharge Instructions (Addendum)
Apply a Band-Aid as needed.

## 2017-12-27 ENCOUNTER — Ambulatory Visit (INDEPENDENT_AMBULATORY_CARE_PROVIDER_SITE_OTHER): Payer: Medicare Other

## 2017-12-27 ENCOUNTER — Ambulatory Visit (INDEPENDENT_AMBULATORY_CARE_PROVIDER_SITE_OTHER): Payer: Medicare Other | Admitting: Physician Assistant

## 2017-12-27 ENCOUNTER — Encounter (INDEPENDENT_AMBULATORY_CARE_PROVIDER_SITE_OTHER): Payer: Self-pay | Admitting: Orthopedic Surgery

## 2017-12-27 ENCOUNTER — Ambulatory Visit (INDEPENDENT_AMBULATORY_CARE_PROVIDER_SITE_OTHER): Payer: Self-pay

## 2017-12-27 VITALS — Ht 66.0 in | Wt 213.0 lb

## 2017-12-27 DIAGNOSIS — M1611 Unilateral primary osteoarthritis, right hip: Secondary | ICD-10-CM | POA: Diagnosis not present

## 2017-12-27 DIAGNOSIS — M19011 Primary osteoarthritis, right shoulder: Secondary | ICD-10-CM

## 2017-12-27 DIAGNOSIS — M25551 Pain in right hip: Secondary | ICD-10-CM | POA: Diagnosis not present

## 2017-12-27 DIAGNOSIS — G8929 Other chronic pain: Secondary | ICD-10-CM | POA: Diagnosis not present

## 2017-12-27 DIAGNOSIS — M25511 Pain in right shoulder: Secondary | ICD-10-CM

## 2017-12-27 MED ORDER — METHYLPREDNISOLONE ACETATE 40 MG/ML IJ SUSP
40.0000 mg | INTRAMUSCULAR | Status: AC | PRN
  Administered 2017-12-27: 40 mg via INTRA_ARTICULAR

## 2017-12-27 MED ORDER — LIDOCAINE HCL 1 % IJ SOLN
5.0000 mL | INTRAMUSCULAR | Status: AC | PRN
  Administered 2017-12-27: 5 mL

## 2017-12-27 NOTE — Progress Notes (Signed)
Office Visit Note   Patient: Anna [name]           Date of Birth: 1945-03-25           MRN: [id number] Visit Date: 12/27/2017              Requested by: [name], MD [address] [place], [place] [address] PCP: [name], MD  Chief Complaint  Patient presents with  . Right Shoulder - Pain  . Right Hip - Pain      HPI: The patient is a 72 year old female who is seen for follow-up of right hip and right shoulder pain.  She has had continued right hip pain and indicates pain is in the groin area radiating out laterally over the right hip.  She reports the pain is worse when she has to ambulate for long periods or sit for long periods and she does feel like it takes her a little while to move otherwise due to stiffness and pain in the hip when she stands.  She is also had some right shoulder pain with diminished right shoulder range of motion.  She has pain over the biceps and deltoid area.  She has difficulty donning and doffing her shirt.  She does report that she is having to assist her husband a lot due to significant dementia right now and she cannot pursue any type of operative intervention at this point.  Assessment & Plan: Visit Diagnoses:  1. Pain in right hip   2. Chronic right shoulder pain   3. Primary osteoarthritis of right hip   4. Osteoarthritis of right AC (acromioclavicular) joint     Plan: after informed consent the patient's right shoulder was injected with lidocaine and Depo medrol under sterile techniques and the patient tolerated this well.  We discussed that she may eventually need to consider right hip arthroplasty as her symptoms have continued to progress.  Will refer to Dr. Ninfa Linden should she desire to proceed with right total hip replacement.  She does report that she is currently caring for her husband who has significant dementia and this will be very difficult at this point.  Follow-Up Instructions: Return if symptoms worsen or  fail to improve.   Ortho Exam  Patient is alert, oriented, no adenopathy, well-dressed, normal affect, normal respiratory effort. Examination of the right hip shows internal rotation to 0 degrees external rotation to 30 to 40 degrees with pain on end range both directions more so in internal rotation.  She has good hip flexion.  She is neurovascularly intact distally.  Exam his right shoulder shows decreased forward flexion abduction internal rotation external rotation with pain over the biceps proximally and pain over the right acromioclavicular joint to palpation.  She is neurovascularly intact distally.  Imaging: Xr Hip Unilat W Or W/o Pelvis 2-3 Views Right  Result Date: 12/27/2017 2 view right hip shows osteoarthritic changes with  joint space narrowing and cyst formation and subchondral sclerosis which is moderate to severe  Xr Shoulder Right  Result Date: 12/27/2017 Good joint space preservation. AC joint arthritic changes. No fractures or other abnormality  No images are attached to the encounter.  Labs: Lab Results  Component Value Date   ESRSEDRATE 7 08/11/2017   CRP <1 08/11/2017     Lab Results  Component Value Date   ALBUMIN 4.2 08/11/2017    Body mass index is 34.38 kg/m.  Orders:  Orders Placed This Encounter  Procedures  . Large  Joint Inj  . XR Shoulder Right  . XR HIP UNILAT W OR W/O PELVIS 2-3 VIEWS RIGHT   No orders of the defined types were placed in this encounter.    Procedures: Large Joint Inj: R subacromial bursa on 12/27/2017 12:39 PM Indications: diagnostic evaluation and pain Details: 22 G 1.5 in needle  Arthrogram: No  Medications: 5 mL lidocaine 1 %; 40 mg methylPREDNISolone acetate 40 MG/ML Outcome: tolerated well, no immediate complications Procedure, treatment alternatives, risks and benefits explained, specific risks discussed. Consent was given by the patient. Immediately prior to procedure a time out was called to verify the  correct patient, procedure, equipment, support staff and site/side marked as required. Patient was prepped and draped in the usual sterile fashion.      Clinical Data: No additional findings.  ROS:  All other systems negative, except as noted in the HPI. Review of Systems  Objective: Vital Signs: Ht 5\' 6"  (1.676 m)   Wt 213 lb (96.6 kg)   BMI 34.38 kg/m   Specialty Comments:  No specialty comments available.  PMFS History: Patient Active Problem List   Diagnosis Date Noted  . Chronic migraine 08/11/2017   Past Medical History:  Diagnosis Date  . Breast cancer [facility])    age 27  . Headache   . Vision abnormalities     Family History  Problem Relation Age of Onset  . Hyperlipidemia Mother   . Hypertension Mother   . Alzheimer's disease Father   . Hypertension Sister   . Hypertension Sister   . Hypertension Sister   . Hypertension Brother   . Lung cancer Brother   . Hypertension Sister   . Hypertension Brother     Past Surgical History:  Procedure Laterality Date  . BACK SURGERY     lumbar region  . BUNIONECTOMY Bilateral   . MASTECTOMY Left    at age 34  . TRIGGER FINGER RELEASE Left    left thumb  . TUBAL LIGATION     Social History   Occupational History  . Not on file  Tobacco Use  . Smoking status: Never Smoker  . Smokeless tobacco: Never Used  Substance and Sexual Activity  . Alcohol use: Never    Frequency: Never  . Drug use: Never  . Sexual activity: Not Currently

## 2017-12-28 ENCOUNTER — Ambulatory Visit (INDEPENDENT_AMBULATORY_CARE_PROVIDER_SITE_OTHER): Payer: Medicare Other | Admitting: Orthopedic Surgery

## 2017-12-29 ENCOUNTER — Ambulatory Visit (INDEPENDENT_AMBULATORY_CARE_PROVIDER_SITE_OTHER): Payer: Medicare Other | Admitting: Orthopedic Surgery

## 2018-01-04 ENCOUNTER — Ambulatory Visit (INDEPENDENT_AMBULATORY_CARE_PROVIDER_SITE_OTHER): Payer: Medicare Other | Admitting: Orthopaedic Surgery

## 2018-01-04 ENCOUNTER — Other Ambulatory Visit (INDEPENDENT_AMBULATORY_CARE_PROVIDER_SITE_OTHER): Payer: Self-pay

## 2018-01-04 ENCOUNTER — Encounter (INDEPENDENT_AMBULATORY_CARE_PROVIDER_SITE_OTHER): Payer: Self-pay | Admitting: Orthopaedic Surgery

## 2018-01-04 DIAGNOSIS — M25551 Pain in right hip: Secondary | ICD-10-CM

## 2018-01-04 DIAGNOSIS — M7061 Trochanteric bursitis, right hip: Secondary | ICD-10-CM

## 2018-01-04 DIAGNOSIS — M1611 Unilateral primary osteoarthritis, right hip: Secondary | ICD-10-CM

## 2018-01-04 DIAGNOSIS — M25511 Pain in right shoulder: Secondary | ICD-10-CM | POA: Diagnosis not present

## 2018-01-04 DIAGNOSIS — G8929 Other chronic pain: Secondary | ICD-10-CM

## 2018-01-04 MED ORDER — METHYLPREDNISOLONE ACETATE 40 MG/ML IJ SUSP
40.0000 mg | INTRAMUSCULAR | Status: AC | PRN
  Administered 2018-01-04: 40 mg via INTRA_ARTICULAR

## 2018-01-04 MED ORDER — LIDOCAINE HCL 1 % IJ SOLN
3.0000 mL | INTRAMUSCULAR | Status: AC | PRN
  Administered 2018-01-04: 3 mL

## 2018-01-04 NOTE — Progress Notes (Signed)
Office Visit Note   Patient: Anna [name]           Date of Birth: 08-Dec-1945           MRN: [id number] Visit Date: 01/04/2018              Requested by: [name], MD [address] [place], [place] [address] PCP: [name], MD   Assessment & Plan: Visit Diagnoses:  1. Trochanteric bursitis, right hip   2. Unilateral primary osteoarthritis, right hip   3. Chronic right shoulder pain     Plan: She is currently not interested in any type of surgical intervention I agree with this as well based on her clinical exam findings.  I provided a steroid injection of the trochanteric area of her right hip and in her right shoulder subacromial area.  I would like to set her up for an intra-articular injection of her right hip under direct fluoroscopy.  This can be done by Dr. [name].  I will see her back myself in about 2 months and see how she is done overall.  I recommended weight loss as well.  I have also recommended she try a cane temporarily in her opposite left hand.  All question concerns were answered and addressed.  Follow-Up Instructions: Return in about 2 months (around 03/07/2018).   Orders:  Orders Placed This Encounter  Procedures  . Large Joint Inj  . Large Joint Inj  . Large Joint Inj   No orders of the defined types were placed in this encounter.     Procedures: Large Joint Inj: R greater trochanter on 01/04/2018 9:58 AM Indications: pain and diagnostic evaluation Details: 22 G 1.5 in needle, lateral approach  Arthrogram: No  Medications: 3 mL lidocaine 1 %; 40 mg methylPREDNISolone acetate 40 MG/ML Outcome: tolerated well, no immediate complications Procedure, treatment alternatives, risks and benefits explained, specific risks discussed. Consent was given by the patient. Immediately prior to procedure a time out was called to verify the correct patient, procedure, equipment, support staff and site/side marked as required. Patient was prepped  and draped in the usual sterile fashion.   Large Joint Inj: R subacromial bursa on 01/04/2018 10:03 AM Indications: pain and diagnostic evaluation Details: 22 G 1.5 in needle  Arthrogram: No  Medications: 3 mL lidocaine 1 %; 40 mg methylPREDNISolone acetate 40 MG/ML Outcome: tolerated well, no immediate complications Procedure, treatment alternatives, risks and benefits explained, specific risks discussed. Consent was given by the patient. Immediately prior to procedure a time out was called to verify the correct patient, procedure, equipment, support staff and site/side marked as required. Patient was prepped and draped in the usual sterile fashion.       Clinical Data: No additional findings.   Subjective: Chief Complaint  Patient presents with  . Right Hip - Pain  The patient comes in for evaluation treatment of right hip pain but also right shoulder pain.  Her right hip is actually been evaluated by 1 of my colleagues.  She has pain in the groin but also on the outside aspect of her hip.  She has been told that it was significant arthritis and wants to consider hip replacement surgery but nothing anytime soon.  She is not a diabetic.  She is moderately obese with a BMI of close to 35.  She does not ambulate with any type of assistive device.  Her pain is more of an aggravating pain that affects her quality of life  and her actives daily living.  She is been also experiencing right shoulder pain and some weakness as well.  She has to lift her husband's rolling walker several times a day to help him out.  She denies any specific injury to her right shoulder or her right hip.  She denies any numbness and tingling in her right hand her right foot and ankle.  HPI  Review of Systems She currently denies any headache, chest pain, shortness of breath, fever, chills, nausea, vomiting.  She is not a diabetic.  Objective: Vital Signs: There were no vitals taken for this visit.  Physical  Exam She is alert orient x3 and in no acute distress Ortho Exam Examination of her right shoulder shows fluid and full range of motion the shoulder but definitely painful in the subacromial outlet in the subdeltoid area with signs of impingement.  There is no significant weakness of the rotator cuff itself but there is pain when stressing the rotator cuff.  I suspect she may have a partial tear at least.  Examination of her right hip shows fluid and internal/external rotation with only pains on extremes of rotation.  Most of her pains of the trochanteric area but some of it is in the groin. Specialty Comments:  No specialty comments available.  Imaging: No results found. X-rays on the canopy system are independently reviewed of her pelvis and right hip.  He can see comparing the right hip to the left hip that the right hip joint space is slightly more narrowed but there is still abundant space of the superior lateral aspect.  I would say that she has mild to moderate arthritic changes in that hip.  PMFS History: Patient Active Problem List   Diagnosis Date Noted  . Chronic migraine 08/11/2017   Past Medical History:  Diagnosis Date  . Breast cancer [facility])    age 72  . Headache   . Vision abnormalities     Family History  Problem Relation Age of Onset  . Hyperlipidemia Mother   . Hypertension Mother   . Alzheimer's disease Father   . Hypertension Sister   . Hypertension Sister   . Hypertension Sister   . Hypertension Brother   . Lung cancer Brother   . Hypertension Sister   . Hypertension Brother     Past Surgical History:  Procedure Laterality Date  . BACK SURGERY     lumbar region  . BUNIONECTOMY Bilateral   . MASTECTOMY Left    at age 72  . TRIGGER FINGER RELEASE Left    left thumb  . TUBAL LIGATION     Social History   Occupational History  . Not on file  Tobacco Use  . Smoking status: Never Smoker  . Smokeless tobacco: Never Used  Substance and Sexual Activity   . Alcohol use: Never    Frequency: Never  . Drug use: Never  . Sexual activity: Not Currently

## 2018-01-10 ENCOUNTER — Ambulatory Visit
Admission: RE | Admit: 2018-01-10 | Discharge: 2018-01-10 | Disposition: A | Discharge status: Home / Self Care | Payer: Medicare Other | Source: Ambulatory Visit | Attending: Internal Medicine | Admitting: Internal Medicine

## 2018-01-10 DIAGNOSIS — Z1231 Encounter for screening mammogram for malignant neoplasm of breast: Secondary | ICD-10-CM | POA: Diagnosis not present

## 2018-01-11 DIAGNOSIS — R079 Chest pain, unspecified: Secondary | ICD-10-CM | POA: Diagnosis not present

## 2018-01-17 DIAGNOSIS — L989 Disorder of the skin and subcutaneous tissue, unspecified: Secondary | ICD-10-CM | POA: Diagnosis not present

## 2018-01-17 DIAGNOSIS — Z124 Encounter for screening for malignant neoplasm of cervix: Secondary | ICD-10-CM | POA: Diagnosis not present

## 2018-01-19 ENCOUNTER — Encounter (INDEPENDENT_AMBULATORY_CARE_PROVIDER_SITE_OTHER): Payer: Self-pay | Admitting: Physical Medicine and Rehabilitation

## 2018-01-19 ENCOUNTER — Ambulatory Visit (INDEPENDENT_AMBULATORY_CARE_PROVIDER_SITE_OTHER): Payer: Self-pay

## 2018-01-19 ENCOUNTER — Ambulatory Visit (INDEPENDENT_AMBULATORY_CARE_PROVIDER_SITE_OTHER): Payer: Medicare Other | Admitting: Physical Medicine and Rehabilitation

## 2018-01-19 DIAGNOSIS — M25551 Pain in right hip: Secondary | ICD-10-CM

## 2018-01-19 NOTE — Patient Instructions (Signed)

## 2018-01-19 NOTE — Progress Notes (Signed)
 .  Numeric Pain Rating Scale and Functional Assessment Average Pain 7   In the last MONTH (on 0-10 scale) has pain interfered with the following?  1. General activity like being  able to carry out your everyday physical activities such as walking, climbing stairs, carrying groceries, or moving a chair?  Rating(5)  -Dye Allergies.  

## 2018-01-19 NOTE — Progress Notes (Signed)
   Anna [name] - 72 y.o. female MRN [id number]  Date of birth: [birth year]  Office Visit Note: Visit Date: 01/19/2018 PCP: [name], MD Referred by: [name], MD  Subjective: Chief Complaint  Patient presents with  . Right Hip - Pain   HPI:  Anna [name] is a 72 y.o. female who comes in today At the request of Dr. Jean Rosenthal for diagnostic and hopefully therapeutic right anesthetic hip arthrogram.  She reports 7 out of 10 hip and groin pain that worsens with crossing her hip over and trying to cross her legs and worse with going up and down stairs and incline.  This is been ongoing for 6 months or more.  Prior greater trochanteric injection offered some relief.  ROS Otherwise per HPI.  Assessment & Plan: Visit Diagnoses:  1. Pain in right hip     Plan: No additional findings.   Meds & Orders: No orders of the defined types were placed in this encounter.   Orders Placed This Encounter  Procedures  . Large Joint Inj: R hip joint  . XR C-ARM NO REPORT    Follow-up: Return if symptoms worsen or fail to improve, for Jean Rosenthal, M.D..   Procedures: Large Joint Inj: R hip joint on 01/19/2018 9:39 AM Indications: pain and diagnostic evaluation Details: 22 G needle, anterior approach  Arthrogram: Yes  Medications: 80 mg triamcinolone acetonide 40 MG/ML; 3 mL bupivacaine 0.5 % Outcome: tolerated well, no immediate complications  Arthrogram demonstrated excellent flow of contrast throughout the joint surface without extravasation or obvious defect.  The patient had relief of symptoms during the anesthetic phase of the injection.  Procedure, treatment alternatives, risks and benefits explained, specific risks discussed. Consent was given by the patient. Immediately prior to procedure a time out was called to verify the correct patient, procedure, equipment, support staff and site/side marked as required. Patient was prepped and draped in the usual  sterile fashion.      No notes on file   Clinical History: No specialty comments available.     Objective:  VS:  HT:    WT:   BMI:     BP:   HR: bpm  TEMP: ( )  RESP:  Physical Exam  Ortho Exam Imaging: Xr C-arm No Report  Result Date: 01/19/2018 Please see Notes tab for imaging impression.

## 2018-01-20 DIAGNOSIS — R1013 Epigastric pain: Secondary | ICD-10-CM | POA: Diagnosis not present

## 2018-01-20 MED ORDER — TRIAMCINOLONE ACETONIDE 40 MG/ML IJ SUSP
80.0000 mg | INTRAMUSCULAR | Status: AC | PRN
  Administered 2018-01-19: 80 mg via INTRA_ARTICULAR

## 2018-01-20 MED ORDER — BUPIVACAINE HCL 0.5 % IJ SOLN
3.0000 mL | INTRAMUSCULAR | Status: AC | PRN
  Administered 2018-01-19: 3 mL via INTRA_ARTICULAR

## 2018-02-12 ENCOUNTER — Ambulatory Visit
Admission: EM | Admit: 2018-02-12 | Discharge: 2018-02-12 | Disposition: A | Discharge status: Home / Self Care | Payer: Medicare Other | Attending: Family Medicine | Admitting: Family Medicine

## 2018-02-12 ENCOUNTER — Encounter: Payer: Self-pay | Admitting: Emergency Medicine

## 2018-02-12 ENCOUNTER — Other Ambulatory Visit: Payer: Self-pay

## 2018-02-12 DIAGNOSIS — K219 Gastro-esophageal reflux disease without esophagitis: Secondary | ICD-10-CM | POA: Insufficient documentation

## 2018-02-12 MED ORDER — IPRATROPIUM-ALBUTEROL 0.5-2.5 (3) MG/3ML IN SOLN: 3.0000 mL | Freq: Once | RESPIRATORY_TRACT | Status: DC

## 2018-02-12 NOTE — Discharge Instructions (Addendum)
Your EKG was normal I believe this is related to your gastrointestinal issues, possibly GERD. Follow-up with your doctor on Monday

## 2018-02-12 NOTE — ED Notes (Signed)
Patient complains of indigestion with epigastric pain which began last night. Patient took OTC medication. Patient denies nausea and vomiting.

## 2018-02-12 NOTE — ED Provider Notes (Signed)
[facility]    CSN: [id number] Arrival date & time: 02/12/18  [place]     History   Chief Complaint Chief Complaint  Patient presents with  . Gastroesophageal Reflux    HPI Anna [name] is a 73 y.o. female.   Patient is a 73 year old female with past medical history of breast cancer, headache, migraine.  She presents today with waxing and waning mid chest discomfort/epigastric discomfort radiating into her left breast that has been going on for the past couple of months.  Her symptoms are worsened by eating certain meals to include spicy, greasy foods.  She takes Prilosec daily for GERD.  She had an upper endoscopy done recently within the past month.  This revealed multiple problems to include Barrett's esophagus, GERD and gastritis.  She was told at that time to change her diet to improve her symptoms.  She admits to not adhering to the specific diet at times.  When she does this it seems to exacerbate her symptoms.  She denies any current chest pain, shortness of breath, palpitations, dizziness, lightheadedness.  She denies any recent cough, congestion, fevers.  She denies any nausea, diaphoresis.  No history of DVT, PE or cardiac issues.  ROS per HPI      Past Medical History:  Diagnosis Date  . Breast cancer [facility])    age 72  . Headache   . Vision abnormalities     Patient Active Problem List   Diagnosis Date Noted  . Chronic migraine 08/11/2017    Past Surgical History:  Procedure Laterality Date  . BACK SURGERY     lumbar region  . BUNIONECTOMY Bilateral   . MASTECTOMY Left    at age 50  . TRIGGER FINGER RELEASE Left    left thumb  . TUBAL LIGATION      OB History   No obstetric history on file.      Home Medications    Prior to Admission medications   Medication Sig Start Date End Date Taking? Authorizing Provider  acetaminophen (TYLENOL) 325 MG tablet Take 650 mg by mouth every 6 (six) hours as needed.    [provider]    Calcium 600-200 MG-UNIT tablet Take 2 tablets by mouth daily.    [provider]  ferrous sulfate (SLOW IRON) 160 (50 Fe) MG TBCR SR tablet Take 1 tablet by mouth daily.    [provider]  IBUPROFEN PO Take 800 mg tablet by mouth 1-3 times daily    [provider]  levothyroxine (SYNTHROID, LEVOTHROID) 88 MCG tablet Take 88 mcg by mouth daily before breakfast.    [provider]  omeprazole (PRILOSEC) 20 MG capsule Take 20 mg by mouth daily.    [provider]  pravastatin (PRAVACHOL) 40 MG tablet Take 40 mg by mouth daily.  12/28/16   [provider]  SUMAtriptan (IMITREX) 25 MG tablet Take 1 tablet (25 mg total) by mouth every 2 (two) hours as needed for migraine. May repeat in 2 hours if headache persists or recurs. 08/11/17   [name], MD    Family History Family History  Problem Relation Age of Onset  . Hyperlipidemia Mother   . Hypertension Mother   . Alzheimer's disease Father   . Hypertension Sister   . Hypertension Sister   . Hypertension Sister   . Hypertension Brother   . Lung cancer Brother   . Hypertension Sister   . Hypertension Brother     Social History Social  History   Tobacco Use  . Smoking status: Never Smoker  . Smokeless tobacco: Never Used  Substance Use Topics  . Alcohol use: Never    Frequency: Never  . Drug use: Never     Allergies   Sulfa antibiotics   Review of Systems Review of Systems   Physical Exam Triage Vital Signs ED Triage Vitals  Enc Vitals Group     BP 02/12/18 1508 118/77     Pulse Rate 02/12/18 1508 74     Resp 02/12/18 1508 16     Temp 02/12/18 1508 97.9 F (36.6 C)     Temp Source 02/12/18 1508 Oral     SpO2 02/12/18 1508 96 %     Weight 02/12/18 1525 208 lb (94.3 kg)     Height 02/12/18 1525 5\' 6"  (1.676 m)     Head Circumference --      Peak Flow --      Pain Score 02/12/18 1524 0     Pain Loc --      Pain Edu? --      Excl. in [place]? --    No data  found.  Updated Vital Signs BP 118/77 (BP Location: Right Arm)   Pulse 68   Temp 97.9 F (36.6 C) (Oral)   Resp 16   Ht 5\' 6"  (1.676 m)   Wt 208 lb (94.3 kg)   SpO2 96%   BMI 33.57 kg/m   Visual Acuity Right Eye Distance:   Left Eye Distance:   Bilateral Distance:    Right Eye Near:   Left Eye Near:    Bilateral Near:     Physical Exam Vitals signs and nursing note reviewed.  Constitutional:      General: She is not in acute distress.    Appearance: Normal appearance. She is not ill-appearing, toxic-appearing or diaphoretic.  HENT:     Head: Normocephalic and atraumatic.     Nose: Nose normal.     Mouth/Throat:     Pharynx: Oropharynx is clear.  Eyes:     Conjunctiva/sclera: Conjunctivae normal.  Neck:     Musculoskeletal: Normal range of motion.  Cardiovascular:     Rate and Rhythm: Normal rate and regular rhythm.     Pulses: Normal pulses.     Heart sounds: Normal heart sounds.  Pulmonary:     Effort: Pulmonary effort is normal.     Breath sounds: Normal breath sounds.  Abdominal:     Palpations: Abdomen is soft.     Tenderness: There is no abdominal tenderness.  Musculoskeletal: Normal range of motion.  Skin:    General: Skin is warm and dry.  Neurological:     Mental Status: She is alert.  Psychiatric:        Mood and Affect: Mood normal.      UC Treatments / Results  Labs (all labs ordered are listed, but only abnormal results are displayed) Labs Reviewed - No data to display  EKG None  Radiology No results found.  Procedures Procedures (including critical care time)  Medications Ordered in UC Medications - No data to display  Initial Impression / Assessment and Plan / UC Course  I have reviewed the triage vital signs and the nursing notes.  Pertinent labs & imaging results that were available during my care of the patient were reviewed by me and considered in my medical decision making (see chart for details).     Patient is a  73 year old female that comes  in with epigastric discomfort and left upper quadrant discomfort that is exacerbated by eating certain meals.  She has a history of the same.  She has already been evaluated by GI with upper endoscopy.  Patient comes in with daughter today with concerns because the pain was radiating to the left upper abdomen/chest area. EKG revealed normal sinus rhythm with normal rate.  This is same as previous EKGs.  She has no other concerning signs or symptoms on exam.  Her vital signs are stable.  She is nontoxic or ill-appearing.  There is no concern for ACS, PE at this time.  Most likely her symptoms are related to GERD/gastritis. We will have her continue her Prilosec daily.  Instructed to follow-up with her doctor on Monday for further evaluation and management.  Also recommended to avoid certain things in her diet that exacerbate her symptoms. Strict precautions that if her symptoms continue or worsen to include severe chest pain, shortness of breath, dizziness, lightheadedness, nausea, diaphoresis she needs to go to the ER. Patient is understanding and agreeable to plan. Final Clinical Impressions(s) / UC Diagnoses   Final diagnoses:  Gastroesophageal reflux disease without esophagitis     Discharge Instructions     Your EKG was normal I believe this is related to your gastrointestinal issues, possibly GERD. Follow-up with your doctor on Monday    ED Prescriptions    None     Controlled Substance Prescriptions [place] Controlled Substance Registry consulted? no   Orvan July, NP 02/12/18 1935

## 2018-02-12 NOTE — ED Triage Notes (Signed)
Per pt she has been having discomfort when she eats since her endoscopy. She stated she feels like something is moving when she eats. Pt stated she is having chest pain and more when she eats.

## 2018-02-21 ENCOUNTER — Other Ambulatory Visit (INDEPENDENT_AMBULATORY_CARE_PROVIDER_SITE_OTHER): Payer: Self-pay

## 2018-02-21 ENCOUNTER — Encounter (INDEPENDENT_AMBULATORY_CARE_PROVIDER_SITE_OTHER): Payer: Self-pay | Admitting: Orthopaedic Surgery

## 2018-02-21 ENCOUNTER — Ambulatory Visit (INDEPENDENT_AMBULATORY_CARE_PROVIDER_SITE_OTHER): Payer: Medicare Other | Admitting: Orthopaedic Surgery

## 2018-02-21 DIAGNOSIS — M4807 Spinal stenosis, lumbosacral region: Secondary | ICD-10-CM

## 2018-02-21 DIAGNOSIS — M25551 Pain in right hip: Secondary | ICD-10-CM

## 2018-02-21 DIAGNOSIS — M5431 Sciatica, right side: Secondary | ICD-10-CM

## 2018-02-21 NOTE — Progress Notes (Signed)
The patient returns for follow-up after having an intra-articular injection in her right hip joint by Dr. [name] under fluoroscopy.  She says that things feel worse for her and that the injection did not help at all.  However she is denying groin pain and reports pain radiating down her right leg to her knee all the way to her foot.  On exam I can easily put her right hip through full internal and external rotation with no difficulty at all.  Based on clinical exam I am convinced this is not a hip issue.  Given her continued radicular symptoms I would like to send her for an MRI of her lumbar spine to rule out a herniated disc or pinched nerve that is causing the radicular symptoms.  Again this is not a hip issue based on 1, her clinical exam showing no pain in the groin and fluid range of motion and to, the fact that she got no relief at all even a little bit from any type of injection.  We will see her back after the MRI of her back.  All question concerns were answered and addressed.

## 2018-03-05 ENCOUNTER — Ambulatory Visit
Admission: RE | Admit: 2018-03-05 | Discharge: 2018-03-05 | Disposition: A | Discharge status: Home / Self Care | Payer: Medicare Other | Source: Ambulatory Visit | Attending: Orthopaedic Surgery | Admitting: Orthopaedic Surgery

## 2018-03-05 DIAGNOSIS — M4807 Spinal stenosis, lumbosacral region: Secondary | ICD-10-CM

## 2018-03-05 DIAGNOSIS — M5416 Radiculopathy, lumbar region: Secondary | ICD-10-CM | POA: Diagnosis not present

## 2018-03-07 ENCOUNTER — Encounter (INDEPENDENT_AMBULATORY_CARE_PROVIDER_SITE_OTHER): Payer: Self-pay | Admitting: Orthopaedic Surgery

## 2018-03-07 ENCOUNTER — Ambulatory Visit (INDEPENDENT_AMBULATORY_CARE_PROVIDER_SITE_OTHER): Payer: Medicare Other | Admitting: Orthopaedic Surgery

## 2018-03-07 ENCOUNTER — Other Ambulatory Visit (INDEPENDENT_AMBULATORY_CARE_PROVIDER_SITE_OTHER): Payer: Self-pay

## 2018-03-07 DIAGNOSIS — G8929 Other chronic pain: Secondary | ICD-10-CM | POA: Insufficient documentation

## 2018-03-07 DIAGNOSIS — M5431 Sciatica, right side: Secondary | ICD-10-CM

## 2018-03-07 DIAGNOSIS — M25511 Pain in right shoulder: Secondary | ICD-10-CM | POA: Diagnosis not present

## 2018-03-07 DIAGNOSIS — M4807 Spinal stenosis, lumbosacral region: Secondary | ICD-10-CM

## 2018-03-07 MED ORDER — GABAPENTIN 100 MG PO CAPS: 100.0000 mg | ORAL_CAPSULE | Freq: Three times a day (TID) | ORAL | 0 refills | Status: DC

## 2018-03-07 NOTE — Progress Notes (Signed)
The patient comes to me today to go over an MRI of her lumbar spine.  She is originally sent to me to consider hip replacement surgery on her right hip exam is entirely normal and there is no pain in the groin and not significant arthritis on her x-rays.  She is describing pain going down her backside all the way down to her foot with numbness and tingling so were sent her for an MRI.  She is also been dealing with right shoulder pain and weakness.  Plain films of the right shoulder were also normal.  She still has problems lifting her arm above her head and weakness in her right shoulder as well as pain.  She says she hurts all day long because of her back and sciatic issues as well.  On exam she does have weakness of the rotator cuff and has problems reaching above her head on the right side.  She also has a positive straight leg raise on the right lower extremity.  She has normal exam in terms of mobility of her hip and full range of motion of the hip no pain in the groin.  MRI of her lumbar spine does show some degenerative changes at L3-4 and L4-L5 with facet disease quite severe on the right side with also small disc bulge that may be affecting release the L4 nerve.  She also has anterolisthesis at L3-L4.  I would like to send her to Dr. [name] for an ESI to the right side at likely an area that would affect the L4 nerve.  I would also like to send her for an MRI of her right shoulder to rule out a rotator cuff tear.  I will try Neurontin 100 mg to take in the evening and to increase this up to 3 times a day if needed.  All question concerns were answered and addressed.  We will work on setting her up for the MRI of her right shoulder as well as the intervention by Dr. [name] at the right side of her lumbar spine.  I will see her back myself in about 4 weeks.

## 2018-03-11 DIAGNOSIS — Z124 Encounter for screening for malignant neoplasm of cervix: Secondary | ICD-10-CM | POA: Diagnosis not present

## 2018-03-24 ENCOUNTER — Ambulatory Visit
Admission: RE | Admit: 2018-03-24 | Discharge: 2018-03-24 | Disposition: A | Discharge status: Home / Self Care | Payer: Medicare Other | Source: Ambulatory Visit | Attending: Orthopaedic Surgery | Admitting: Orthopaedic Surgery

## 2018-03-24 ENCOUNTER — Ambulatory Visit (INDEPENDENT_AMBULATORY_CARE_PROVIDER_SITE_OTHER): Payer: Self-pay

## 2018-03-24 ENCOUNTER — Encounter (INDEPENDENT_AMBULATORY_CARE_PROVIDER_SITE_OTHER): Payer: Self-pay | Admitting: Physical Medicine and Rehabilitation

## 2018-03-24 ENCOUNTER — Ambulatory Visit (INDEPENDENT_AMBULATORY_CARE_PROVIDER_SITE_OTHER): Payer: Medicare Other | Admitting: Physical Medicine and Rehabilitation

## 2018-03-24 VITALS — BP 142/68 | HR 79 | Temp 98.1°F

## 2018-03-24 DIAGNOSIS — M48061 Spinal stenosis, lumbar region without neurogenic claudication: Secondary | ICD-10-CM

## 2018-03-24 DIAGNOSIS — M5416 Radiculopathy, lumbar region: Secondary | ICD-10-CM

## 2018-03-24 DIAGNOSIS — M25511 Pain in right shoulder: Principal | ICD-10-CM

## 2018-03-24 DIAGNOSIS — G8929 Other chronic pain: Secondary | ICD-10-CM

## 2018-03-24 MED ORDER — BETAMETHASONE SOD PHOS & ACET 6 (3-3) MG/ML IJ SUSP
12.0000 mg | Freq: Once | INTRAMUSCULAR | Status: AC
  Administered 2018-03-24: 12 mg

## 2018-03-24 NOTE — Progress Notes (Signed)
 .  Numeric Pain Rating Scale and Functional Assessment Average Pain 7   In the last MONTH (on 0-10 scale) has pain interfered with the following?  1. General activity like being  able to carry out your everyday physical activities such as walking, climbing stairs, carrying groceries, or moving a chair?  Rating(5)   +Driver, -BT, -Dye Allergies.  

## 2018-03-25 NOTE — Progress Notes (Signed)
Anna [name] - 73 y.o. female MRN [id number]  Date of birth: 1945-07-02  Office Visit Note: Visit Date: 03/24/2018 PCP: [name], MD Referred by: [name], MD  Subjective: Chief Complaint  Patient presents with  . Lower Back - Pain  . Right Hip - Pain  . Right Thigh - Pain   HPI: Anna [name] is a 73 y.o. female who comes in today For planned right L4 transforaminal epidural steroid injection as requested by Dr. Jean Rosenthal.  She has pain in the right hip some in the groin and thigh.  Intra-articular injection was well placed and with excellent flow contrast but gave her no relief.  She has no pain with hip rotation.  She does have significant findings now on MRI of the lumbar spine which was performed at Dr. Trevor Mace request.  She has severe foraminal stenosis at L3 with facet joint arthritis at L4-5 she has severe right more than left severe arthritis.  There is some listhesis of L3 on L4 with suspected pars defects.  We are going to try a right L4 transforaminal injection.  Would consider right L3 transforaminal injection versus facet joint block at L4-5.  ROS Otherwise per HPI.  Assessment & Plan: Visit Diagnoses:  1. Lumbar radiculopathy   2. Foraminal stenosis of lumbar region     Plan: No additional findings.   Meds & Orders:  Meds ordered this encounter  Medications  . betamethasone acetate-betamethasone sodium phosphate (CELESTONE) injection 12 mg    Orders Placed This Encounter  Procedures  . XR C-ARM NO REPORT  . Epidural Steroid injection    Follow-up: Return if symptoms worsen or fail to improve, for Consider L4-5 facet block.   Procedures: No procedures performed  Lumbosacral Transforaminal Epidural Steroid Injection - Sub-Pedicular Approach with Fluoroscopic Guidance  Patient: Anna [name]      Date of Birth: 04-Apr-1945 MRN: [id number] PCP: [name], MD      Visit Date: 03/24/2018   Universal Protocol:    Date/Time:  03/24/2018  Consent Given By: the patient  Position: PRONE  Additional Comments: Vital signs were monitored before and after the procedure. Patient was prepped and draped in the usual sterile fashion. The correct patient, procedure, and site was verified.   Injection Procedure Details:  Procedure Site One Meds Administered:  Meds ordered this encounter  Medications  . betamethasone acetate-betamethasone sodium phosphate (CELESTONE) injection 12 mg    Laterality: Right  Location/Site:  L4-L5  Needle size: 22 G  Needle type: Spinal  Needle Placement: Transforaminal  Findings:    -Comments: Excellent flow of contrast along the nerve and into the epidural space.  Procedure Details: After squaring off the end-plates to get a true AP view, the C-arm was positioned so that an oblique view of the foramen as noted above was visualized. The target area is just inferior to the "nose of the scotty dog" or sub pedicular. The soft tissues overlying this structure were infiltrated with 2-3 ml. of 1% Lidocaine without Epinephrine.  The spinal needle was inserted toward the target using a "trajectory" view along the fluoroscope beam.  Under AP and lateral visualization, the needle was advanced so it did not puncture dura and was located close the 6 O'Clock position of the pedical in AP tracterory. Biplanar projections were used to confirm position. Aspiration was confirmed to be negative for CSF and/or blood. A 1-2 ml. volume of Isovue-250 was injected and flow of contrast was noted at each  level. Radiographs were obtained for documentation purposes.   After attaining the desired flow of contrast documented above, a 0.5 to 1.0 ml test dose of 0.25% Marcaine was injected into each respective transforaminal space.  The patient was observed for 90 seconds post injection.  After no sensory deficits were reported, and normal lower extremity motor function was noted,   the above injectate was  administered so that equal amounts of the injectate were placed at each foramen (level) into the transforaminal epidural space.   Additional Comments:  The patient tolerated the procedure well Dressing: 2 x 2 sterile gauze and Band-Aid    Post-procedure details: Patient was observed during the procedure. Post-procedure instructions were reviewed.  Patient left the clinic in stable condition.     Clinical History: MRI LUMBAR SPINE WITHOUT CONTRAST  TECHNIQUE: Multiplanar, multisequence MR imaging of the lumbar spine was performed. No intravenous contrast was administered.  COMPARISON:  None.  FINDINGS: SEGMENTATION: For the purposes of this report, the last well-formed intervertebral disc is reported as L5-S1.  ALIGNMENT: Maintained lumbar lordosis. 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  VERTEBRAE:Vertebral bodies are intact. Moderate to severe L3-4 and severe L5-S1 disc height loss with mild desiccation L3-4 through L5-S1 discs. Moderate subacute on chronic discogenic endplate changes F7-4, moderate to severe at L5-S1. No suspicious or acute bone marrow signal.  CONUS MEDULLARIS AND CAUDA EQUINA: Conus medullaris terminates at T12-L1 and demonstrates normal morphology and signal characteristics. Cauda equina is normal.  PARASPINAL AND OTHER SOFT TISSUES: Nonacute. T2 bright cyst bilateral kidneys.  DISC LEVELS:  T12-L1 and L1-2: No disc bulge, canal stenosis nor neural foraminal narrowing. Mild L1-2 facet arthropathy.  L2-3: No disc bulge, canal stenosis nor neural foraminal narrowing. Mild facet arthropathy.  L3-4: Anterolisthesis. Severe facet arthropathy without canal stenosis. Severe bilateral neural foraminal narrowing.  L4-5: Small broad-based disc bulge. Severe RIGHT > LEFT facet arthropathy with trace LEFT facet effusion. No canal stenosis. Mild-to-moderate bilateral neural foraminal  narrowing.  L5-S1: Small central disc protrusion. Moderate facet arthropathy without canal stenosis. Mild LEFT neural foraminal narrowing.  IMPRESSION: 1. Grade 1 L3-4 anterolisthesis on the basis of chronic bilateral L3 pars interarticularis defects. No acute osseous process. 2. No canal stenosis. Neural foraminal narrowing L3-4 through L5-S1: Severe at L3-4.   Electronically Signed   By: Elon Alas M.D.   On: 03/06/2018 05:29   She reports that she has never smoked. She has never used smokeless tobacco. No results for input(s): HGBA1C, LABURIC in the last 8760 hours.  Objective:  VS:  HT:    WT:   BMI:     BP:(!) 142/68  HR:79bpm  TEMP:98.1 F (36.7 C)(Oral)  RESP:  Physical Exam  Ortho Exam Imaging: Mr Shoulder Right Wo Contrast  Result Date: 03/24/2018 CLINICAL DATA:  Right shoulder pain and limited range of motion for 2.5 months. No known injury. EXAM: MRI OF THE RIGHT SHOULDER WITHOUT CONTRAST TECHNIQUE: Multiplanar, multisequence MR imaging of the shoulder was performed. No intravenous contrast was administered. COMPARISON:  None. FINDINGS: Rotator cuff: There is supraspinatus and infraspinatus tendinopathy. An undersurface tear of the anterior and far lateral supraspinatus measures 0.3 cm from front to back. There is no full-thickness tear or tendon retraction. Muscles:  No atrophy or focal lesion. Biceps long head: Intact. Intrasubstance increased T2 signal in the intra-articular segment consistent with tendinosis noted. 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 Type 1 acromion. A small volume of fluid is seen  in the subacromial/subdeltoid bursa. Glenohumeral Joint: Appears normal. Labrum: The superior labrum is torn from the glenoid from approximately the 1 o'clock position anteriorly to the 10 o'clock position posteriorly, superiorly. Bones:  No fracture or worrisome lesion. Other: None. IMPRESSION: Rotator cuff tendinopathy  with a 0.3 cm undersurface tear of the anterior and far lateral supraspinatus. Negative for full-thickness tear or tendon retraction. Tear of the superior labrum from approximately the 1 o'clock position anteriorly to the 10 o'clock position posteriorly. Moderate to moderately severe acromioclavicular osteoarthritis. Electronically Signed   By: Inge Rise M.D.   On: 03/24/2018 12:02   Xr C-arm No Report  Result Date: 03/24/2018 Please see Notes tab for imaging impression.   Past Medical/Family/Surgical/Social History: Medications & Allergies reviewed per EMR, new medications updated. Patient Active Problem List   Diagnosis Date Noted  . Chronic right shoulder pain 03/07/2018  . Chronic migraine 08/11/2017   Past Medical History:  Diagnosis Date  . Breast cancer [facility])    age 89  . Headache   . Vision abnormalities    Family History  Problem Relation Age of Onset  . Hyperlipidemia Mother   . Hypertension Mother   . Alzheimer's disease Father   . Hypertension Sister   . Hypertension Sister   . Hypertension Sister   . Hypertension Brother   . Lung cancer Brother   . Hypertension Sister   . Hypertension Brother    Past Surgical History:  Procedure Laterality Date  . BACK SURGERY     lumbar region  . BUNIONECTOMY Bilateral   . MASTECTOMY Left    at age 37  . TRIGGER FINGER RELEASE Left    left thumb  . TUBAL LIGATION     Social History   Occupational History  . Not on file  Tobacco Use  . Smoking status: Never Smoker  . Smokeless tobacco: Never Used  Substance and Sexual Activity  . Alcohol use: Never    Frequency: Never  . Drug use: Never  . Sexual activity: Not Currently

## 2018-03-25 NOTE — Procedures (Signed)
Lumbosacral Transforaminal Epidural Steroid Injection - Sub-Pedicular Approach with Fluoroscopic Guidance  Patient: Anna [name]      Date of Birth: 08/31/1945 MRN: [id number] PCP: [name], MD      Visit Date: 03/24/2018   Universal Protocol:    Date/Time: 03/24/2018  Consent Given By: the patient  Position: PRONE  Additional Comments: Vital signs were monitored before and after the procedure. Patient was prepped and draped in the usual sterile fashion. The correct patient, procedure, and site was verified.   Injection Procedure Details:  Procedure Site One Meds Administered:  Meds ordered this encounter  Medications  . betamethasone acetate-betamethasone sodium phosphate (CELESTONE) injection 12 mg    Laterality: Right  Location/Site:  L4-L5  Needle size: 22 G  Needle type: Spinal  Needle Placement: Transforaminal  Findings:    -Comments: Excellent flow of contrast along the nerve and into the epidural space.  Procedure Details: After squaring off the end-plates to get a true AP view, the C-arm was positioned so that an oblique view of the foramen as noted above was visualized. The target area is just inferior to the "nose of the scotty dog" or sub pedicular. The soft tissues overlying this structure were infiltrated with 2-3 ml. of 1% Lidocaine without Epinephrine.  The spinal needle was inserted toward the target using a "trajectory" view along the fluoroscope beam.  Under AP and lateral visualization, the needle was advanced so it did not puncture dura and was located close the 6 O'Clock position of the pedical in AP tracterory. Biplanar projections were used to confirm position. Aspiration was confirmed to be negative for CSF and/or blood. A 1-2 ml. volume of Isovue-250 was injected and flow of contrast was noted at each level. Radiographs were obtained for documentation purposes.   After attaining the desired flow of contrast documented above, a 0.5 to 1.0  ml test dose of 0.25% Marcaine was injected into each respective transforaminal space.  The patient was observed for 90 seconds post injection.  After no sensory deficits were reported, and normal lower extremity motor function was noted,   the above injectate was administered so that equal amounts of the injectate were placed at each foramen (level) into the transforaminal epidural space.   Additional Comments:  The patient tolerated the procedure well Dressing: 2 x 2 sterile gauze and Band-Aid    Post-procedure details: Patient was observed during the procedure. Post-procedure instructions were reviewed.  Patient left the clinic in stable condition.

## 2018-03-31 ENCOUNTER — Other Ambulatory Visit (INDEPENDENT_AMBULATORY_CARE_PROVIDER_SITE_OTHER): Payer: Self-pay | Admitting: Orthopaedic Surgery

## 2018-03-31 DIAGNOSIS — E039 Hypothyroidism, unspecified: Secondary | ICD-10-CM | POA: Diagnosis not present

## 2018-03-31 DIAGNOSIS — D509 Iron deficiency anemia, unspecified: Secondary | ICD-10-CM | POA: Diagnosis not present

## 2018-03-31 DIAGNOSIS — K219 Gastro-esophageal reflux disease without esophagitis: Secondary | ICD-10-CM | POA: Diagnosis not present

## 2018-03-31 DIAGNOSIS — E7800 Pure hypercholesterolemia, unspecified: Secondary | ICD-10-CM | POA: Diagnosis not present

## 2018-03-31 NOTE — Telephone Encounter (Signed)
Please advise 

## 2018-04-04 ENCOUNTER — Encounter (INDEPENDENT_AMBULATORY_CARE_PROVIDER_SITE_OTHER): Payer: Self-pay | Admitting: Orthopaedic Surgery

## 2018-04-04 ENCOUNTER — Ambulatory Visit (INDEPENDENT_AMBULATORY_CARE_PROVIDER_SITE_OTHER): Payer: Medicare Other | Admitting: Orthopaedic Surgery

## 2018-04-04 DIAGNOSIS — G8929 Other chronic pain: Secondary | ICD-10-CM | POA: Diagnosis not present

## 2018-04-04 DIAGNOSIS — M25511 Pain in right shoulder: Secondary | ICD-10-CM | POA: Diagnosis not present

## 2018-04-04 MED ORDER — TIZANIDINE HCL 4 MG PO TABS: 4.0000 mg | ORAL_TABLET | Freq: Four times a day (QID) | ORAL | 1 refills | Status: DC | PRN

## 2018-04-04 NOTE — Progress Notes (Signed)
The patient comes in today mainly to go over an MRI of her right shoulder.  It is been bothering her for some time and we have tried and failed conservative treatment.  She is also had a recent epidural steroid injection of her lumbar spine by Dr. [name].  She says the pain is not as bad as it was before the injection was some helpful in terms of her back.  The shoulder still giving her problems.  She is had at least 2 subacromial injections in that shoulder.  On exam left shoulder moves smoothly the right shoulder is stiff and has a lot of pain when she get her past 9 degrees of abduction.  The MRI does show moderate to severe AC joint arthritis in that right shoulder.  There is a small undersurface tear of the rotator cuff with significant tendinopathy of the rotator cuff.  There is a superior labral tear as well.  The glenohumeral joint is otherwise intact.  As far as her shoulder goes we talked about the possibility of an arthroscopic intervention on that shoulder if it becomes bad enough for her.  Right now she wants to just try muscle relaxant due to spasming in her neck and back and so I will send in some Zanaflex for her.  We will see her back in 4 weeks to see how she is doing overall in terms of her back and her shoulder.

## 2018-04-18 ENCOUNTER — Other Ambulatory Visit (INDEPENDENT_AMBULATORY_CARE_PROVIDER_SITE_OTHER): Payer: Self-pay | Admitting: Orthopaedic Surgery

## 2018-04-19 NOTE — Telephone Encounter (Signed)
Please advise 

## 2018-04-29 ENCOUNTER — Telehealth (INDEPENDENT_AMBULATORY_CARE_PROVIDER_SITE_OTHER): Payer: Self-pay

## 2018-04-29 NOTE — Telephone Encounter (Signed)
Called pt and lm on vm to advise need to ask pre screen questions prior to appt Monday with Dr. Ninfa Linden. To call back

## 2018-05-02 ENCOUNTER — Ambulatory Visit (INDEPENDENT_AMBULATORY_CARE_PROVIDER_SITE_OTHER): Payer: Medicare Other | Admitting: Orthopaedic Surgery

## 2018-05-31 ENCOUNTER — Other Ambulatory Visit (INDEPENDENT_AMBULATORY_CARE_PROVIDER_SITE_OTHER): Payer: Self-pay | Admitting: Physician Assistant

## 2018-05-31 NOTE — Telephone Encounter (Signed)
Please advise 

## 2018-06-08 ENCOUNTER — Encounter: Payer: Self-pay | Admitting: Orthopaedic Surgery

## 2018-06-08 ENCOUNTER — Ambulatory Visit: Payer: Medicare Other | Admitting: Orthopaedic Surgery

## 2018-06-08 ENCOUNTER — Ambulatory Visit: Payer: Self-pay

## 2018-06-08 ENCOUNTER — Other Ambulatory Visit: Payer: Self-pay

## 2018-06-08 DIAGNOSIS — M25511 Pain in right shoulder: Secondary | ICD-10-CM | POA: Diagnosis not present

## 2018-06-08 DIAGNOSIS — M4807 Spinal stenosis, lumbosacral region: Secondary | ICD-10-CM

## 2018-06-08 DIAGNOSIS — M1711 Unilateral primary osteoarthritis, right knee: Secondary | ICD-10-CM

## 2018-06-08 DIAGNOSIS — G8929 Other chronic pain: Secondary | ICD-10-CM

## 2018-06-08 MED ORDER — METHYLPREDNISOLONE ACETATE 40 MG/ML IJ SUSP
40.0000 mg | INTRAMUSCULAR | Status: AC | PRN
  Administered 2018-06-08: 40 mg via INTRA_ARTICULAR

## 2018-06-08 MED ORDER — LIDOCAINE HCL 1 % IJ SOLN
3.0000 mL | INTRAMUSCULAR | Status: AC | PRN
  Administered 2018-06-08: 3 mL

## 2018-06-08 MED ORDER — METHYLPREDNISOLONE ACETATE 40 MG/ML IJ SUSP
40.0000 mg | INTRAMUSCULAR | Status: AC | PRN
  Administered 2018-06-08: 15:00:00 40 mg via INTRA_ARTICULAR

## 2018-06-08 NOTE — Progress Notes (Signed)
Office Visit Note   Patient: Anna [name]           Date of Birth: 01-May-1945           MRN: [id number] Visit Date: 06/08/2018              Requested by: [name], MD [address] [place], [place] [address] PCP: [name], MD   Assessment & Plan: Visit Diagnoses:  1. Primary osteoarthritis of right knee   2. Spinal stenosis of lumbosacral region   3. Chronic right shoulder pain     Plan: Offered patient formal physical therapy for shoulder knees she declined.  She is currently caring for her husband who is quite ill and his only caregiver.  She also is unable to undergo any type of arthroscopic shoulder surgery due to her husband's illness.  Therefore we will have her do quad strengthening exercises at home and shoulder exercises as shown today.  Also set her up for possible facet injections lower lumbar with Dr. [name] in the near future.  She will follow-up with [place] on as-needed basis.  Follow-Up Instructions: Return if symptoms worsen or fail to improve.   Orders:  Orders Placed This Encounter  Procedures  . Large Joint Inj: R knee  . Large Joint Inj: R subacromial bursa  . XR Knee 1-2 Views Right  . Ambulatory referral to Physical Medicine Rehab   No orders of the defined types were placed in this encounter.     Procedures: Large Joint Inj: R knee on 06/08/2018 3:11 PM Indications: pain Details: 22 G 1.5 in needle, anterolateral approach  Arthrogram: No  Medications: 3 mL lidocaine 1 %; 40 mg methylPREDNISolone acetate 40 MG/ML Outcome: tolerated well, no immediate complications Procedure, treatment alternatives, risks and benefits explained, specific risks discussed. Consent was given by the patient. Immediately prior to procedure a time out was called to verify the correct patient, procedure, equipment, support staff and site/side marked as required. Patient was prepped and draped in the usual sterile fashion.   Large Joint Inj: R  subacromial bursa on 06/08/2018 3:12 PM Indications: pain Details: 22 G 1.5 in needle, lateral approach  Arthrogram: No  Medications: 3 mL lidocaine 1 %; 40 mg methylPREDNISolone acetate 40 MG/ML Outcome: tolerated well, no immediate complications Procedure, treatment alternatives, risks and benefits explained, specific risks discussed. Consent was given by the patient. Immediately prior to procedure a time out was called to verify the correct patient, procedure, equipment, support staff and site/side marked as required. Patient was prepped and draped in the usual sterile fashion.       Clinical Data: No additional findings.   Subjective: Chief Complaint  Patient presents with  . Lower Back - Follow-up  . Right Shoulder - Follow-up  . Right Knee - Pain    HPI Anna [name] 73 year old female who is well-known to our department service comes in today for 3 complaints right shoulder pain which is chronic.  She has known moderate AC joint arthritis and a small rotator cuff tear with tendinopathy also a superior labral tear.  Second low back pain without radicular symptoms.  She is undergone transforaminal injection L4-5 by Dr. [name] back in February states this definitely is helped she still having some low back pain without radicular pain.  She has pain with transfers from sitting position and also when getting in and out of a car in her low back region.  She had an MRI back in February which  showed foraminal stenosis at L3 and facet joint arthritis at L4-5.  Also spinal listhesis at L3 on L4.  Third she is having right knee pain mainly medial aspect of the knee.  No mechanical symptoms.  No known injury.  She states she feels that this is due to arthritis she has arthritis in her left knee.  Left knee is currently not bothering her.  She is wanting this looked at today.  Review of Systems No fevers or chills.  Objective: Vital Signs: There were no vitals taken for this visit.  Physical  Exam Constitutional:      Appearance: She is not ill-appearing or diaphoretic.  Pulmonary:     Effort: Pulmonary effort is normal.  Neurological:     Mental Status: She is alert and oriented to person, place, and time.  Psychiatric:        Mood and Affect: Mood normal.        Behavior: Behavior normal.     Ortho Exam Tight hamstrings bilaterally.  Tenderness over the right lower lumbar paraspinous region.  Negative straight leg raise bilaterally.  Bilateral 5 strength throughout lower extremities against resistance. Bilateral hips full range of motion without pain.    Bilateral knees good range of motion tenderness along medial joint line both knees with patellofemoral crepitus.  No instability valgus varus stressing.  No abnormal warmth erythema of either knee.  Bilateral shoulders: 5 out of 5 strength with external and internal rotation against resistance.  Empty can test negative bilaterally.  Positive impingement testing on the right negative on the left.  Specialty Comments:  No specialty comments available.  Imaging: Xr Knee 1-2 Views Right  Result Date: 06/08/2018 AP lateral views right knee: No acute fractures.  Knee is well located.  Moderate medial compartmental narrowing and moderate patellofemoral arthritic changes.  Lateral compartment is well-preserved.    PMFS History: Patient Active Problem List   Diagnosis Date Noted  . Chronic right shoulder pain 03/07/2018  . Chronic migraine 08/11/2017   Past Medical History:  Diagnosis Date  . Breast cancer [facility])    age 16  . Headache   . Vision abnormalities     Family History  Problem Relation Age of Onset  . Hyperlipidemia Mother   . Hypertension Mother   . Alzheimer's disease Father   . Hypertension Sister   . Hypertension Sister   . Hypertension Sister   . Hypertension Brother   . Lung cancer Brother   . Hypertension Sister   . Hypertension Brother     Past Surgical History:  Procedure Laterality Date   . BACK SURGERY     lumbar region  . BUNIONECTOMY Bilateral   . MASTECTOMY Left    at age 68  . TRIGGER FINGER RELEASE Left    left thumb  . TUBAL LIGATION     Social History   Occupational History  . Not on file  Tobacco Use  . Smoking status: Never Smoker  . Smokeless tobacco: Never Used  Substance and Sexual Activity  . Alcohol use: Never    Frequency: Never  . Drug use: Never  . Sexual activity: Not Currently

## 2018-06-23 ENCOUNTER — Other Ambulatory Visit (INDEPENDENT_AMBULATORY_CARE_PROVIDER_SITE_OTHER): Payer: Self-pay | Admitting: Orthopaedic Surgery

## 2018-06-23 NOTE — Telephone Encounter (Signed)
Please advise 

## 2018-07-29 ENCOUNTER — Other Ambulatory Visit (INDEPENDENT_AMBULATORY_CARE_PROVIDER_SITE_OTHER): Payer: Self-pay | Admitting: Orthopaedic Surgery

## 2018-07-29 NOTE — Telephone Encounter (Signed)
Please advise 

## 2018-08-23 ENCOUNTER — Telehealth: Payer: Self-pay | Admitting: Physical Medicine and Rehabilitation

## 2018-08-23 ENCOUNTER — Ambulatory Visit: Payer: Self-pay

## 2018-08-23 ENCOUNTER — Encounter: Payer: Self-pay | Admitting: Physical Medicine and Rehabilitation

## 2018-08-23 ENCOUNTER — Ambulatory Visit (INDEPENDENT_AMBULATORY_CARE_PROVIDER_SITE_OTHER): Payer: Medicare Other | Admitting: Physical Medicine and Rehabilitation

## 2018-08-23 VITALS — BP 120/67 | HR 77

## 2018-08-23 DIAGNOSIS — M47816 Spondylosis without myelopathy or radiculopathy, lumbar region: Secondary | ICD-10-CM

## 2018-08-23 MED ORDER — METHYLPREDNISOLONE ACETATE 80 MG/ML IJ SUSP
80.0000 mg | Freq: Once | INTRAMUSCULAR | Status: AC
  Administered 2018-08-23: 80 mg

## 2018-08-23 NOTE — Progress Notes (Signed)
 .  Numeric Pain Rating Scale and Functional Assessment Average Pain 8   In the last MONTH (on 0-10 scale) has pain interfered with the following?  1. General activity like being  able to carry out your everyday physical activities such as walking, climbing stairs, carrying groceries, or moving a chair?  Rating(7)   +Driver, -BT, -Dye Allergies.  

## 2018-08-23 NOTE — Procedures (Signed)
Lumbar Facet Joint Intra-Articular Injection(s) with Fluoroscopic Guidance  Patient: Anna [name]      Date of Birth: [birth year] MRN: [id number] PCP: [name], MD      Visit Date: 08/23/2018   Universal Protocol:    Date/Time: 08/23/2018  Consent Given By: the patient  Position: PRONE   Additional Comments: Vital signs were monitored before and after the procedure. Patient was prepped and draped in the usual sterile fashion. The correct patient, procedure, and site was verified.   Injection Procedure Details:  Procedure Site One Meds Administered:  Meds ordered this encounter  Medications  . methylPREDNISolone acetate (DEPO-MEDROL) injection 80 mg     Laterality: Right  Location/Site:  L4-L5 L5-S1  Needle size: 22 guage  Needle type: Spinal  Needle Placement: Articular  Findings:  -Comments: Excellent flow of contrast producing a partial arthrogram.  Procedure Details: The fluoroscope beam is vertically oriented in AP, and the inferior recess is visualized beneath the lower pole of the inferior apophyseal process, which represents the target point for needle insertion. When direct visualization is difficult the target point is located at the medial projection of the vertebral pedicle. The region overlying each aforementioned target is locally anesthetized with a 1 to 2 ml. volume of 1% Lidocaine without Epinephrine.   The spinal needle was inserted into each of the above mentioned facet joints using biplanar fluoroscopic guidance. A 0.25 to 0.5 ml. volume of Isovue-250 was injected and a partial facet joint arthrogram was obtained. A single spot film was obtained of the resulting arthrogram.    One to 1.25 ml of the steroid/anesthetic solution was then injected into each of the facet joints noted above.   Additional Comments:  The patient tolerated the procedure well Dressing: 2 x 2 sterile gauze and Band-Aid    Post-procedure details: Patient was observed  during the procedure. Post-procedure instructions were reviewed.  Patient left the clinic in stable condition.

## 2018-09-02 ENCOUNTER — Telehealth: Payer: Self-pay | Admitting: Physical Medicine and Rehabilitation

## 2018-09-02 NOTE — Telephone Encounter (Signed)
Scheduled for 8/6 at 1015.

## 2018-09-02 NOTE — Telephone Encounter (Signed)
Needs rigtht IA injection of hip with fluoro

## 2018-09-06 ENCOUNTER — Other Ambulatory Visit (INDEPENDENT_AMBULATORY_CARE_PROVIDER_SITE_OTHER): Payer: Self-pay | Admitting: Orthopaedic Surgery

## 2018-09-06 NOTE — Telephone Encounter (Signed)
Error

## 2018-09-06 NOTE — Telephone Encounter (Signed)
Please advise 

## 2018-09-08 ENCOUNTER — Encounter: Payer: Self-pay | Admitting: Physical Medicine and Rehabilitation

## 2018-09-08 ENCOUNTER — Ambulatory Visit (INDEPENDENT_AMBULATORY_CARE_PROVIDER_SITE_OTHER): Payer: Medicare Other | Admitting: Physical Medicine and Rehabilitation

## 2018-09-08 ENCOUNTER — Ambulatory Visit: Payer: Self-pay

## 2018-09-08 DIAGNOSIS — M25551 Pain in right hip: Secondary | ICD-10-CM

## 2018-09-08 MED ORDER — BUPIVACAINE HCL 0.25 % IJ SOLN
4.0000 mL | INTRAMUSCULAR | Status: AC | PRN
  Administered 2018-09-08: 4 mL via INTRA_ARTICULAR

## 2018-09-08 MED ORDER — TRIAMCINOLONE ACETONIDE 40 MG/ML IJ SUSP
60.0000 mg | INTRAMUSCULAR | Status: AC | PRN
  Administered 2018-09-08: 60 mg via INTRA_ARTICULAR

## 2018-09-08 NOTE — Progress Notes (Signed)
Anna [name] - 73 y.o. female MRN [id number]  Date of birth: 26-Jan-1946  Office Visit Note: Visit Date: 09/08/2018 PCP: [name], MD Referred by: [name], MD  Subjective: Chief Complaint  Patient presents with  . Right Hip - Pain   HPI:  Anna [name] is a 73 y.o. female who comes in today For planned intra-articular anesthetic hip arthrogram on the right.  She is followed by Dr. 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  Interestingly in December 2019 we completed intra-articular anesthetic arthrogram with at least relief during the anesthetic phase to some degree but ultimately not much relief.  She went on to have updated MRI of the lumbar spine and lumbar spine injection with some relief.  More recently we saw her and she was having return of symptoms that she felt like was her back and she was having some back pain worse with extension but pain into the right hip and groin worse with movement worse with moving the leg back and forth standing and ambulating.  She does have some knee issues as well.  Dr. Ninfa Linden has really followed her for her shoulder hips knees and back.  Nonetheless today she comes in after the back injection which was a facet joint injection with some relief of her back pain but not hip and groin.  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  She has concordant hip pain with right rotation internally today.  ROS Otherwise per HPI.  Assessment & Plan: Visit Diagnoses:  1. Pain in right hip     Plan: Findings:  Diagnostic anesthetic hip arthrogram with pretty profound relief during the anesthetic phase.  We also were able to aspirate about 3 mL of normal-appearing joint fluid.    Meds & Orders: No orders of the defined types were placed in this encounter.   Orders Placed This Encounter  Procedures  . Large Joint Inj: R hip joint  . Large Joint Inj: R hip joint  . XR C-ARM NO REPORT     Follow-up: Return if symptoms worsen or fail to improve, for Jean Rosenthal, MD.   Procedures: Large Joint Inj: R hip joint on 09/08/2018 10:12 AM Indications: pain and diagnostic evaluation Details: 22 G needle, anterior approach  Arthrogram: Yes  Medications: 4 mL bupivacaine 0.25 %; 60 mg triamcinolone acetonide 40 MG/ML Aspirate: 3 mL serous, yellow and clear Outcome: tolerated well, no immediate complications  Arthrogram demonstrated excellent flow of contrast throughout the joint surface without extravasation or obvious defect.  The patient had relief of symptoms during the anesthetic phase of the injection.  Procedure, treatment alternatives, risks and benefits explained, specific risks discussed. Consent was given by the patient. Immediately prior to procedure a time out was called to verify the correct patient, procedure, equipment, support staff and site/side marked as required. Patient was prepped and draped in the usual sterile fashion.      No notes on file   Clinical History: MRI LUMBAR SPINE WITHOUT CONTRAST  TECHNIQUE: Multiplanar, multisequence MR imaging of the lumbar spine was performed. No intravenous contrast was administered.  COMPARISON:  None.  FINDINGS: SEGMENTATION: For the purposes of this report, the last well-formed intervertebral disc is reported as L5-S1.  ALIGNMENT: Maintained lumbar lordosis. 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  VERTEBRAE:Vertebral bodies are intact. Moderate to severe L3-4 and severe L5-S1 disc height loss with mild desiccation L3-4 through L5-S1 discs. Moderate  subacute on chronic discogenic endplate changes Z0-2, moderate to severe at L5-S1. No suspicious or acute bone marrow signal.  CONUS MEDULLARIS AND CAUDA EQUINA: Conus medullaris terminates at T12-L1 and demonstrates normal morphology and signal characteristics. Cauda equina is normal.  PARASPINAL  AND OTHER SOFT TISSUES: Nonacute. T2 bright cyst bilateral kidneys.  DISC LEVELS:  T12-L1 and L1-2: No disc bulge, canal stenosis nor neural foraminal narrowing. Mild L1-2 facet arthropathy.  L2-3: No disc bulge, canal stenosis nor neural foraminal narrowing. Mild facet arthropathy.  L3-4: Anterolisthesis. Severe facet arthropathy without canal stenosis. Severe bilateral neural foraminal narrowing.  L4-5: Small broad-based disc bulge. Severe RIGHT > LEFT facet arthropathy with trace LEFT facet effusion. No canal stenosis. Mild-to-moderate bilateral neural foraminal narrowing.  L5-S1: Small central disc protrusion. Moderate facet arthropathy without canal stenosis. Mild LEFT neural foraminal narrowing.  IMPRESSION: 1. Grade 1 L3-4 anterolisthesis on the basis of chronic bilateral L3 pars interarticularis defects. No acute osseous process. 2. No canal stenosis. Neural foraminal narrowing L3-4 through L5-S1: Severe at L3-4.   Electronically Signed   By: Elon Alas M.D.   On: 03/06/2018 05:29     Objective:  VS:  HT:    WT:   BMI:     BP:   HR: bpm  TEMP: ( )  RESP:  Physical Exam  Ortho Exam Imaging: Xr C-arm No Report  Result Date: 09/08/2018 Please see Notes tab for imaging impression.

## 2018-09-08 NOTE — Progress Notes (Signed)
 .  Numeric Pain Rating Scale and Functional Assessment Average Pain 7   In the last MONTH (on 0-10 scale) has pain interfered with the following?  1. General activity like being  able to carry out your everyday physical activities such as walking, climbing stairs, carrying groceries, or moving a chair?  Rating(6)   -Dye Allergies.

## 2018-09-27 DIAGNOSIS — Z0000 Encounter for general adult medical examination without abnormal findings: Secondary | ICD-10-CM | POA: Diagnosis not present

## 2018-09-27 DIAGNOSIS — D509 Iron deficiency anemia, unspecified: Secondary | ICD-10-CM | POA: Diagnosis not present

## 2018-09-27 DIAGNOSIS — E559 Vitamin D deficiency, unspecified: Secondary | ICD-10-CM | POA: Diagnosis not present

## 2018-09-27 DIAGNOSIS — E034 Atrophy of thyroid (acquired): Secondary | ICD-10-CM | POA: Diagnosis not present

## 2018-09-30 NOTE — Progress Notes (Signed)
Anna [name] - 73 y.o. female MRN [id number]  Date of birth: 04/26/1945  Office Visit Note: Visit Date: 08/23/2018 PCP: [name], MD Referred by: [name], MD  Subjective: Chief Complaint  Patient presents with  . Lower Back - Pain  . Right Leg - Pain   HPI:  Anna [name] is a 73 y.o. female who comes in today For planned right L4-5 and L5-S1 facet joint blocks diagnostically and hopefully therapeutically.  She is followed by Dr. Jean Rosenthal.  Originally she was seen for right hip and groin pain with fairly normal x-rays and normal exam.  We did complete diagnostic hip injection that seemed to give her some relief during the anesthetic phase but ultimately not rush relief after that.  That injection was in December of last year.  She went on to be reevaluated by Dr. Ninfa Linden who feels like it is probably not her hip and did obtain MRI of the lumbar spine we completed epidural injection without much relief.  Today we are going to complete diagnostic facet blocks.  MRI reviewed below patient still having right lower back and hip pain with some referral to the groin.  ROS Otherwise per HPI.  Assessment & Plan: Visit Diagnoses:  1. Spondylosis without myelopathy or radiculopathy, lumbar region     Plan: No additional findings.   Meds & Orders:  Meds ordered this encounter  Medications  . methylPREDNISolone acetate (DEPO-MEDROL) injection 80 mg    Orders Placed This Encounter  Procedures  . Facet Injection  . XR C-ARM NO REPORT    Follow-up: No follow-ups on file.   Procedures: No procedures performed  Lumbar Facet Joint Intra-Articular Injection(s) with Fluoroscopic Guidance  Patient: Anna [name]      Date of Birth: 06/24/1945 MRN: [id number] PCP: [name], MD      Visit Date: 08/23/2018   Universal Protocol:    Date/Time: 08/23/2018  Consent Given By: the patient  Position: PRONE   Additional Comments: Vital signs were monitored before  and after the procedure. Patient was prepped and draped in the usual sterile fashion. The correct patient, procedure, and site was verified.   Injection Procedure Details:  Procedure Site One Meds Administered:  Meds ordered this encounter  Medications  . methylPREDNISolone acetate (DEPO-MEDROL) injection 80 mg     Laterality: Right  Location/Site:  L4-L5 L5-S1  Needle size: 22 guage  Needle type: Spinal  Needle Placement: Articular  Findings:  -Comments: Excellent flow of contrast producing a partial arthrogram.  Procedure Details: The fluoroscope beam is vertically oriented in AP, and the inferior recess is visualized beneath the lower pole of the inferior apophyseal process, which represents the target point for needle insertion. When direct visualization is difficult the target point is located at the medial projection of the vertebral pedicle. The region overlying each aforementioned target is locally anesthetized with a 1 to 2 ml. volume of 1% Lidocaine without Epinephrine.   The spinal needle was inserted into each of the above mentioned facet joints using biplanar fluoroscopic guidance. A 0.25 to 0.5 ml. volume of Isovue-250 was injected and a partial facet joint arthrogram was obtained. A single spot film was obtained of the resulting arthrogram.    One to 1.25 ml of the steroid/anesthetic solution was then injected into each of the facet joints noted above.   Additional Comments:  The patient tolerated the procedure well Dressing: 2 x 2 sterile gauze and Band-Aid    Post-procedure details: Patient  was observed during the procedure. Post-procedure instructions were reviewed.  Patient left the clinic in stable condition.    Clinical History: MRI LUMBAR SPINE WITHOUT CONTRAST  TECHNIQUE: Multiplanar, multisequence MR imaging of the lumbar spine was performed. No intravenous contrast was administered.  COMPARISON:  None.  FINDINGS: SEGMENTATION: For  the purposes of this report, the last well-formed intervertebral disc is reported as L5-S1.  ALIGNMENT: Maintained lumbar lordosis. 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  VERTEBRAE:Vertebral bodies are intact. Moderate to severe L3-4 and severe L5-S1 disc height loss with mild desiccation L3-4 through L5-S1 discs. Moderate subacute on chronic discogenic endplate changes [id number], moderate to severe at L5-S1. No suspicious or acute bone marrow signal.  CONUS MEDULLARIS AND CAUDA EQUINA: Conus medullaris terminates at T12-L1 and demonstrates normal morphology and signal characteristics. Cauda equina is normal.  PARASPINAL AND OTHER SOFT TISSUES: Nonacute. T2 bright cyst bilateral kidneys.  DISC LEVELS:  T12-L1 and L1-2: No disc bulge, canal stenosis nor neural foraminal narrowing. Mild L1-2 facet arthropathy.  L2-3: No disc bulge, canal stenosis nor neural foraminal narrowing. Mild facet arthropathy.  L3-4: Anterolisthesis. Severe facet arthropathy without canal stenosis. Severe bilateral neural foraminal narrowing.  L4-5: Small broad-based disc bulge. Severe RIGHT > LEFT facet arthropathy with trace LEFT facet effusion. No canal stenosis. Mild-to-moderate bilateral neural foraminal narrowing.  L5-S1: Small central disc protrusion. Moderate facet arthropathy without canal stenosis. Mild LEFT neural foraminal narrowing.  IMPRESSION: 1. Grade 1 L3-4 anterolisthesis on the basis of chronic bilateral L3 pars interarticularis defects. No acute osseous process. 2. No canal stenosis. Neural foraminal narrowing L3-4 through L5-S1: Severe at L3-4.   Electronically Signed   By: Elon Alas M.D.   On: 03/06/2018 05:29     Objective:  VS:  HT:    WT:   BMI:     BP:120/67  HR:77bpm  TEMP: ( )  RESP:  Physical Exam  Ortho Exam Imaging: No results found.

## 2018-10-03 ENCOUNTER — Other Ambulatory Visit: Payer: Self-pay | Admitting: Neurology

## 2018-10-19 ENCOUNTER — Ambulatory Visit: Payer: Medicare Other | Admitting: Orthopaedic Surgery

## 2018-10-19 ENCOUNTER — Ambulatory Visit (INDEPENDENT_AMBULATORY_CARE_PROVIDER_SITE_OTHER): Payer: Medicare Other | Admitting: Orthopaedic Surgery

## 2018-10-19 ENCOUNTER — Other Ambulatory Visit: Payer: Self-pay

## 2018-10-19 DIAGNOSIS — M25511 Pain in right shoulder: Secondary | ICD-10-CM

## 2018-10-19 DIAGNOSIS — G8929 Other chronic pain: Secondary | ICD-10-CM

## 2018-10-19 DIAGNOSIS — M25561 Pain in right knee: Secondary | ICD-10-CM | POA: Diagnosis not present

## 2018-10-19 MED ORDER — LIDOCAINE HCL 1 % IJ SOLN
3.0000 mL | INTRAMUSCULAR | Status: AC | PRN
  Administered 2018-10-19: 3 mL

## 2018-10-19 MED ORDER — METHYLPREDNISOLONE ACETATE 40 MG/ML IJ SUSP
40.0000 mg | INTRAMUSCULAR | Status: AC | PRN
  Administered 2018-10-19: 40 mg via INTRA_ARTICULAR

## 2018-10-19 NOTE — Progress Notes (Signed)
Office Visit Note   Patient: Anna [name]           Date of Birth: 05-11-1945           MRN: [id number] Visit Date: 10/19/2018              Requested by: [name], MD [address] [place],  [place] [id number] PCP: [name], MD   Assessment & Plan: Visit Diagnoses:  1. Chronic pain of right knee   2. Chronic right shoulder pain     Plan: She is not a diabetic so I do feel it is appropriate to provide steroid injections in her right shoulder and her right knee today and she is requested this as well.  She has had steroids in the past so she is fully aware of the risk and benefits of these injections.  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  She tolerated the injections well today.  All question concerns were answered addressed.  Follow-up as otherwise as needed.  Follow-Up Instructions: Return if symptoms worsen or fail to improve.   Orders:  No orders of the defined types were placed in this encounter.  No orders of the defined types were placed in this encounter.     Procedures: Large Joint Inj: R knee on 10/19/2018 4:26 PM Indications: diagnostic evaluation and pain Details: 22 G 1.5 in needle, superolateral approach  Arthrogram: No  Medications: 3 mL lidocaine 1 %; 40 mg methylPREDNISolone acetate 40 MG/ML Outcome: tolerated well, no immediate complications Procedure, treatment alternatives, risks and benefits explained, specific risks discussed. Consent was given by the patient. Immediately prior to procedure a time out was called to verify the correct patient, procedure, equipment, support staff and site/side marked as required. Patient was prepped and draped in the usual sterile fashion.   Large Joint Inj: R subacromial bursa on 10/19/2018 4:26 PM Indications: pain and diagnostic evaluation Details: 22 G 1.5 in needle  Arthrogram: No  Medications: 3 mL lidocaine 1 %; 40 mg  methylPREDNISolone acetate 40 MG/ML Outcome: tolerated well, no immediate complications Procedure, treatment alternatives, risks and benefits explained, specific risks discussed. Consent was given by the patient. Immediately prior to procedure a time out was called to verify the correct patient, procedure, equipment, support staff and site/side marked as required. Patient was prepped and draped in the usual sterile fashion.       Clinical Data: No additional findings.   Subjective: Chief Complaint  Patient presents with  . Right Knee - Pain  The patient is well-known to me.  She is someone who is 73 years old and to be the primary caregiver for her husband who has significant medical issues.  She does a lot of lifting with him so her left shoulder and right shoulder do hurt from all the lifting.  Her right shoulder hurts her more so she would like to have a steroid injection in it today.  Her right knee is also bothersome to her.  She actually last saw Dr. [name] in early August for an intra-articular steroid injection to her left hip.  She also has low back issues.  She is also morbidly obese and a lot of this combined with the stress she puts that her body is helping her husband does cause pain in multiple joints.  She is not interested in any type of surgical intervention given the fact that she does have to  take care of her husband.  Even with surgery, she would need some type of medical clearance and even potentially losing some more weight.  I know this is difficult for her in light of everything that is going on.  HPI  Review of Systems She currently denies any headache, chest pain, shortness of breath, fever, chills, nausea, vomiting  Objective: Vital Signs: There were no vitals taken for this visit.  Physical Exam She is alert and orient x3 and in no acute distress. Ortho Exam Examination of her right shoulder shows a lot of guarding when moving her right shoulder but I can move  fluidly and fully and there is no evidence of significant rotator cuff disease.  There is signs of impingement.  The shoulder is clinically well located.  Examination of her right knee she has global tenderness with medial joint line tenderness as well as patellofemoral crepitation.  There is slight varus malalignment.  There is no significant effusion. Specialty Comments:  No specialty comments available.  Imaging: No results found.   PMFS History: Patient Active Problem List   Diagnosis Date Noted  . Chronic right shoulder pain 03/07/2018  . Chronic migraine 08/11/2017   Past Medical History:  Diagnosis Date  . Breast cancer [facility])    age 35  . Headache   . Vision abnormalities     Family History  Problem Relation Age of Onset  . Hyperlipidemia Mother   . Hypertension Mother   . Alzheimer's disease Father   . Hypertension Sister   . Hypertension Sister   . Hypertension Sister   . Hypertension Brother   . Lung cancer Brother   . Hypertension Sister   . Hypertension Brother     Past Surgical History:  Procedure Laterality Date  . BACK SURGERY     lumbar region  . BUNIONECTOMY Bilateral   . MASTECTOMY Left    at age 14  . TRIGGER FINGER RELEASE Left    left thumb  . TUBAL LIGATION     Social History   Occupational History  . Not on file  Tobacco Use  . Smoking status: Never Smoker  . Smokeless tobacco: Never Used  Substance and Sexual Activity  . Alcohol use: Never    Frequency: Never  . Drug use: Never  . Sexual activity: Not Currently

## 2018-11-01 DIAGNOSIS — Z0000 Encounter for general adult medical examination without abnormal findings: Secondary | ICD-10-CM | POA: Diagnosis not present

## 2018-11-01 DIAGNOSIS — E039 Hypothyroidism, unspecified: Secondary | ICD-10-CM | POA: Diagnosis not present

## 2018-11-01 DIAGNOSIS — D509 Iron deficiency anemia, unspecified: Secondary | ICD-10-CM | POA: Diagnosis not present

## 2018-11-01 DIAGNOSIS — K219 Gastro-esophageal reflux disease without esophagitis: Secondary | ICD-10-CM | POA: Diagnosis not present

## 2018-11-01 DIAGNOSIS — E7800 Pure hypercholesterolemia, unspecified: Secondary | ICD-10-CM | POA: Diagnosis not present

## 2018-12-05 ENCOUNTER — Other Ambulatory Visit: Payer: Self-pay | Admitting: Internal Medicine

## 2018-12-05 DIAGNOSIS — Z1231 Encounter for screening mammogram for malignant neoplasm of breast: Secondary | ICD-10-CM

## 2019-01-23 DIAGNOSIS — N644 Mastodynia: Secondary | ICD-10-CM | POA: Diagnosis not present

## 2019-01-23 DIAGNOSIS — Z124 Encounter for screening for malignant neoplasm of cervix: Secondary | ICD-10-CM | POA: Diagnosis not present

## 2019-01-24 ENCOUNTER — Ambulatory Visit: Payer: Medicare Other

## 2019-01-24 ENCOUNTER — Other Ambulatory Visit: Payer: Self-pay | Admitting: Obstetrics and Gynecology

## 2019-01-24 DIAGNOSIS — N644 Mastodynia: Secondary | ICD-10-CM

## 2019-02-08 ENCOUNTER — Ambulatory Visit: Admission: RE | Admit: 2019-02-08 | Payer: Medicare Other | Source: Ambulatory Visit

## 2019-02-08 ENCOUNTER — Ambulatory Visit
Admission: RE | Admit: 2019-02-08 | Discharge: 2019-02-08 | Disposition: A | Discharge status: Home / Self Care | Payer: Medicare Other | Source: Ambulatory Visit | Attending: Obstetrics and Gynecology | Admitting: Obstetrics and Gynecology

## 2019-02-08 ENCOUNTER — Other Ambulatory Visit: Payer: Self-pay

## 2019-02-08 DIAGNOSIS — N644 Mastodynia: Secondary | ICD-10-CM

## 2019-02-08 DIAGNOSIS — R922 Inconclusive mammogram: Secondary | ICD-10-CM | POA: Diagnosis not present

## 2019-03-27 DIAGNOSIS — M62838 Other muscle spasm: Secondary | ICD-10-CM | POA: Diagnosis not present

## 2019-04-03 ENCOUNTER — Ambulatory Visit: Payer: Medicare Other | Attending: Internal Medicine

## 2019-04-03 DIAGNOSIS — Z23 Encounter for immunization: Secondary | ICD-10-CM | POA: Insufficient documentation

## 2019-04-03 NOTE — Progress Notes (Signed)
   Covid-19 Vaccination Clinic  Name:  BRAYLEA ALTERMAN    MRN: [id number] DOB: Dec 21, 1945  04/03/2019  Ms. Polito was observed post Covid-19 immunization for 15 minutes without incidence. She was provided with Vaccine Information Sheet and instruction to access the V-Safe system.   Ms. Slate was instructed to call 911 with any severe reactions post vaccine: Marland Kitchen Difficulty breathing  . Swelling of your face and throat  . A fast heartbeat  . A bad rash all over your body  . Dizziness and weakness    Immunizations Administered    Name Date Dose VIS Date Route   Pfizer COVID-19 Vaccine 04/03/2019  2:10 PM 0.3 mL 01/13/2019 Intramuscular   Manufacturer: [place]   Lot: [id number]   [place]: [id number]

## 2019-04-06 DIAGNOSIS — M542 Cervicalgia: Secondary | ICD-10-CM | POA: Diagnosis not present

## 2019-04-11 DIAGNOSIS — M542 Cervicalgia: Secondary | ICD-10-CM | POA: Diagnosis not present

## 2019-04-18 DIAGNOSIS — M542 Cervicalgia: Secondary | ICD-10-CM | POA: Diagnosis not present

## 2019-04-26 ENCOUNTER — Ambulatory Visit: Payer: Medicare Other | Attending: Internal Medicine

## 2019-04-26 DIAGNOSIS — D509 Iron deficiency anemia, unspecified: Secondary | ICD-10-CM | POA: Diagnosis not present

## 2019-04-26 DIAGNOSIS — E7800 Pure hypercholesterolemia, unspecified: Secondary | ICD-10-CM | POA: Diagnosis not present

## 2019-04-26 DIAGNOSIS — Z23 Encounter for immunization: Secondary | ICD-10-CM

## 2019-04-26 DIAGNOSIS — M542 Cervicalgia: Secondary | ICD-10-CM | POA: Diagnosis not present

## 2019-04-26 NOTE — Progress Notes (Signed)
   Covid-19 Vaccination Clinic  Name:  Anna [name]    MRN: [id number] DOB: [birth year]  04/26/2019  [name] was observed post Covid-19 immunization for 15 minutes without incident. She was provided with Vaccine Information Sheet and instruction to access the V-Safe system.   [name] was instructed to call 911 with any severe reactions post vaccine: Marland Kitchen Difficulty breathing  . Swelling of face and throat  . A fast heartbeat  . A bad rash all over body  . Dizziness and weakness   Immunizations Administered    Name Date Dose VIS Date Route   Pfizer COVID-19 Vaccine 04/26/2019  1:54 PM 0.3 mL 01/13/2019 Intramuscular   Manufacturer: [place]   Lot: [id number]   [place]: [id number]

## 2019-05-01 DIAGNOSIS — E7800 Pure hypercholesterolemia, unspecified: Secondary | ICD-10-CM | POA: Diagnosis not present

## 2019-05-01 DIAGNOSIS — D509 Iron deficiency anemia, unspecified: Secondary | ICD-10-CM | POA: Diagnosis not present

## 2019-05-01 DIAGNOSIS — E039 Hypothyroidism, unspecified: Secondary | ICD-10-CM | POA: Diagnosis not present

## 2019-05-01 DIAGNOSIS — K219 Gastro-esophageal reflux disease without esophagitis: Secondary | ICD-10-CM | POA: Diagnosis not present

## 2019-05-31 ENCOUNTER — Encounter: Payer: Self-pay | Admitting: Orthopaedic Surgery

## 2019-05-31 ENCOUNTER — Telehealth: Payer: Self-pay

## 2019-05-31 ENCOUNTER — Other Ambulatory Visit: Payer: Self-pay

## 2019-05-31 ENCOUNTER — Ambulatory Visit: Payer: Medicare Other | Admitting: Orthopaedic Surgery

## 2019-05-31 DIAGNOSIS — M25512 Pain in left shoulder: Secondary | ICD-10-CM | POA: Diagnosis not present

## 2019-05-31 DIAGNOSIS — M25511 Pain in right shoulder: Secondary | ICD-10-CM

## 2019-05-31 DIAGNOSIS — M1711 Unilateral primary osteoarthritis, right knee: Secondary | ICD-10-CM

## 2019-05-31 DIAGNOSIS — G8929 Other chronic pain: Secondary | ICD-10-CM | POA: Diagnosis not present

## 2019-05-31 DIAGNOSIS — M25561 Pain in right knee: Secondary | ICD-10-CM

## 2019-05-31 MED ORDER — METHYLPREDNISOLONE ACETATE 40 MG/ML IJ SUSP
40.0000 mg | INTRAMUSCULAR | Status: AC | PRN
  Administered 2019-05-31: 40 mg via INTRA_ARTICULAR

## 2019-05-31 MED ORDER — LIDOCAINE HCL 1 % IJ SOLN
3.0000 mL | INTRAMUSCULAR | Status: AC | PRN
  Administered 2019-05-31: 11:00:00 3 mL

## 2019-05-31 MED ORDER — METHYLPREDNISOLONE ACETATE 40 MG/ML IJ SUSP
40.0000 mg | INTRAMUSCULAR | Status: AC | PRN
  Administered 2019-05-31: 11:00:00 40 mg via INTRA_ARTICULAR

## 2019-05-31 NOTE — Telephone Encounter (Signed)
Right knee gel injection  

## 2019-05-31 NOTE — Progress Notes (Signed)
Office Visit Note   Patient: Anna [name]           Date of Birth: [birth year]           MRN: [id number] Visit Date: 05/31/2019              Requested by: [name], MD [address] [place],  [place] [address] PCP: [name], MD   Assessment & Plan: Visit Diagnoses:  1. Chronic pain of right knee   2. Primary osteoarthritis of right knee   3. Chronic right shoulder pain   4. Chronic left shoulder pain     Plan: I agree with trying injections just to try to get her more mobile and working better and feeling better.  I provided steroid injections in both shoulder subacromial spaces and in the right knee joint.  I would like to see her back in 4 weeks to see how she is doing overall.  At that point we may set her up for an intervention again by Dr. [name].  We will reevaluate her in 4 weeks.  Follow-Up Instructions: Return in about 4 weeks (around 06/28/2019).   Orders:  Orders Placed This Encounter  Procedures  . Large Joint Inj: R knee  . Large Joint Inj: R subacromial bursa  . Large Joint Inj: L subacromial bursa   No orders of the defined types were placed in this encounter.     Procedures: Large Joint Inj: R knee on 05/31/2019 10:55 AM Indications: diagnostic evaluation and pain Details: 22 G 1.5 in needle, superolateral approach  Arthrogram: No  Medications: 3 mL lidocaine 1 %; 40 mg methylPREDNISolone acetate 40 MG/ML Outcome: tolerated well, no immediate complications Procedure, treatment alternatives, risks and benefits explained, specific risks discussed. Consent was given by the patient. Immediately prior to procedure a time out was called to verify the correct patient, procedure, equipment, support staff and site/side marked as required. Patient was prepped and draped in the usual sterile fashion.   Large Joint Inj: R subacromial bursa on 05/31/2019 10:55 AM Indications: pain and diagnostic evaluation Details: 22 G 1.5 in  needle  Arthrogram: No  Medications: 3 mL lidocaine 1 %; 40 mg methylPREDNISolone acetate 40 MG/ML Outcome: tolerated well, no immediate complications Procedure, treatment alternatives, risks and benefits explained, specific risks discussed. Consent was given by the patient. Immediately prior to procedure a time out was called to verify the correct patient, procedure, equipment, support staff and site/side marked as required. Patient was prepped and draped in the usual sterile fashion.   Large Joint Inj: L subacromial bursa on 05/31/2019 10:55 AM Indications: pain and diagnostic evaluation Details: 22 G 1.5 in needle  Arthrogram: No  Medications: 3 mL lidocaine 1 %; 40 mg methylPREDNISolone acetate 40 MG/ML Outcome: tolerated well, no immediate complications Procedure, treatment alternatives, risks and benefits explained, specific risks discussed. Consent was given by the patient. Immediately prior to procedure a time out was called to verify the correct patient, procedure, equipment, support staff and site/side marked as required. Patient was prepped and draped in the usual sterile fashion.       Clinical Data: No additional findings.   Subjective: Chief Complaint  Patient presents with  . Right Shoulder - Follow-up  . Right Knee - Follow-up  The patient is well-known to me.  She comes in today with multiple complaints.  Both her shoulders been hurting quite a bit.  Her right knee which has known severe arthritis and it has been  hurting.  She is also experiencing right-sided low back pain.  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  This is mainly at the L3-L4 level and she did have an intervention by Dr. [name].  We have MRI of her right shoulder before showing rotator cuff tendinopathy and impingement.  We have x-rays of her right knee showing severe arthritis.  She is not a diabetic.  She has been slow getting around.  She  denies any acute change in medical status but is requesting injections in both shoulders and at least her right knee today with steroid.  At some point will probably need to get her back to Dr. [name] for her lumbar spine.  She has had her Covid vaccinations over a month ago.  HPI  Review of Systems She currently denies any headache, chest pain, shortness of breath, fever, chills, nausea, vomiting  Objective: Vital Signs: There were no vitals taken for this visit.  Physical Exam She is alert and orient x3 and in no acute distress Ortho Exam Examination of both shoulder shows her able to abduct her shoulders and reaching overhead.  There is some weakness in the rotator cuff on the right side and she using her deltoid some to abduct her right shoulder but she can do it.  There is some positive signs of impingement on both shoulders.  Examination of her right knee does show varus malalignment with no effusion.  There is significant medial joint tenderness and patellofemoral crepitation.  The right knee feels ligamentously stable with good range of motion.  She does have significant low back pain to the right side with a positive straight leg raise to the right side as well. Specialty Comments:  No specialty comments available.  Imaging: No results found.   PMFS History: Patient Active Problem List   Diagnosis Date Noted  . Chronic right shoulder pain 03/07/2018  . Chronic migraine 08/11/2017   Past Medical History:  Diagnosis Date  . Breast cancer [facility])    age 17  . Headache   . Vision abnormalities     Family History  Problem Relation Age of Onset  . Hyperlipidemia Mother   . Hypertension Mother   . Alzheimer's disease Father   . Hypertension Sister   . Hypertension Sister   . Hypertension Sister   . Hypertension Brother   . Lung cancer Brother   . Hypertension Sister   . Hypertension Brother     Past Surgical History:  Procedure Laterality Date  . BACK SURGERY      lumbar region  . BUNIONECTOMY Bilateral   . MASTECTOMY Left    at age 21  . TRIGGER FINGER RELEASE Left    left thumb  . TUBAL LIGATION     Social History   Occupational History  . Not on file  Tobacco Use  . Smoking status: Never Smoker  . Smokeless tobacco: Never Used  Substance and Sexual Activity  . Alcohol use: Never  . Drug use: Never  . Sexual activity: Not Currently

## 2019-06-01 NOTE — Telephone Encounter (Signed)
Submitted for VOB for Synvisc One-Right knee 

## 2019-06-02 ENCOUNTER — Telehealth: Payer: Self-pay

## 2019-06-02 NOTE — Telephone Encounter (Signed)
Approved for Synvisc One-Right knee Dr. Margarito Liner and Bill $35 copay 20% OOP No prior auth required   New start

## 2019-06-02 NOTE — Telephone Encounter (Signed)
Pt called and informed of approval and copay and stated understanding

## 2019-06-28 ENCOUNTER — Ambulatory Visit: Payer: Medicare Other | Admitting: Orthopaedic Surgery

## 2019-06-28 ENCOUNTER — Encounter: Payer: Self-pay | Admitting: Orthopaedic Surgery

## 2019-06-28 ENCOUNTER — Other Ambulatory Visit: Payer: Self-pay

## 2019-06-28 DIAGNOSIS — M1711 Unilateral primary osteoarthritis, right knee: Secondary | ICD-10-CM | POA: Diagnosis not present

## 2019-06-28 MED ORDER — HYLAN G-F 20 48 MG/6ML IX SOSY
48.0000 mg | PREFILLED_SYRINGE | INTRA_ARTICULAR | Status: AC | PRN
  Administered 2019-06-28: 48 mg via INTRA_ARTICULAR

## 2019-06-28 NOTE — Progress Notes (Signed)
   Procedure Note  Patient: Anna [name]             Date of Birth: Mar 14, 1945           MRN: [id number]             Visit Date: 06/28/2019  Procedures: Visit Diagnoses:  1. Primary osteoarthritis of right knee     Large Joint Inj: R knee on 06/28/2019 10:44 AM Indications: pain and diagnostic evaluation Details: 22 G 1.5 in needle, superolateral approach  Arthrogram: No  Medications: 48 mg Hylan 48 MG/6ML Outcome: tolerated well, no immediate complications Procedure, treatment alternatives, risks and benefits explained, specific risks discussed. Consent was given by the patient. Immediately prior to procedure a time out was called to verify the correct patient, procedure, equipment, support staff and site/side marked as required. Patient was prepped and draped in the usual sterile fashion.    The patient is here today for scheduled Synvisc 1 injection in her right knee to treat the pain from osteoarthritis.  She does have chronic shoulder issues and had a fall recently landing on her left shoulder.  She also hit her face and is just been stiff since then.  She does have known osteoarthritis of the right knee and has failed other conservative treatment measures including steroid injection.  On exam she can lift both her shoulders above her head and they are painful.  Her right knee shows no effusion but has painful range of motion and known osteoarthritis of that knee.  I did place hyaluronic acid in the right knee with Synvisc 1 without difficulty.  All questions and concerns were answered and addressed.  I did recommend physical therapy for her shoulders but she states that she is walking well with her exercises and should do well and will let us know if she needs anything else.

## 2019-07-12 ENCOUNTER — Ambulatory Visit: Payer: Medicare Other | Admitting: Physician Assistant

## 2019-07-12 ENCOUNTER — Ambulatory Visit (INDEPENDENT_AMBULATORY_CARE_PROVIDER_SITE_OTHER): Payer: Medicare Other

## 2019-07-12 ENCOUNTER — Encounter: Payer: Self-pay | Admitting: Physician Assistant

## 2019-07-12 ENCOUNTER — Other Ambulatory Visit: Payer: Self-pay

## 2019-07-12 VITALS — Ht 66.0 in | Wt 208.0 lb

## 2019-07-12 DIAGNOSIS — M545 Low back pain, unspecified: Secondary | ICD-10-CM

## 2019-07-12 MED ORDER — PREDNISONE 10 MG PO TABS: 20.0000 mg | ORAL_TABLET | Freq: Every day | ORAL | 0 refills | Status: DC

## 2019-07-12 MED ORDER — METHOCARBAMOL 500 MG PO TABS
500.0000 mg | ORAL_TABLET | Freq: Four times a day (QID) | ORAL | 0 refills | Status: DC | PRN
Start: 2019-07-12 — End: 2020-10-09

## 2019-07-12 NOTE — Progress Notes (Signed)
Office Visit Note   Patient: Anna [name]           Date of Birth: December 19, 1945           MRN: [id number] Visit Date: 07/12/2019              Requested by: [name], MD [address] [place],  [place] [address] PCP: [name], MD  Chief Complaint  Patient presents with  . Lower Back - Pain, New Patient (Initial Visit)      HPI: This is a pleasant 74 year old woman with a chief complaint of mid lower back pain.  She said this began yesterday.  She denies any injury or falls.  She only noted going for a walk.  She describes the pain as aching and not sharp.  She notices it more when she bends forward and comes back up.  She does have a history of back and hip problems for which she has been given epidural steroid injections in the past.  Her last one was last summer.  She said this feels different.  She denies any radicular symptoms   Assessment & Plan: Visit Diagnoses:  1. Low back pain, unspecified back pain laterality, unspecified chronicity, unspecified whether sciatica present     Plan: Findings most consistent with lumbar back strain.  I have placed her on some prednisone and had told her to wean down on this as she improves and not to stop it suddenly.  She should take it with breakfast.  I have also tried her on a muscle relaxant as needed.  If she does not improve her symptoms progress we refer her to one of our back specialist follow-up in 2-week  Follow-Up Instructions: No follow-ups on file.   Ortho Exam  Patient is alert, oriented, no adenopathy, well-dressed, normal affect, normal respiratory effort. Focused examination of her lower back demonstrates no palpable defects.  She is tender over the paravertebral muscles at L3-4.  Side bending and extension does not make this worse.  She does have some pulling with forward flexion.  No radicular symptoms equal good strength no antalgic gait  Imaging: No results found. No images are attached to the  encounter.  Labs: Lab Results  Component Value Date   ESRSEDRATE 7 08/11/2017   CRP <1 08/11/2017     Lab Results  Component Value Date   ALBUMIN 4.2 08/11/2017    No results found for: MG No results found for: VD25OH  No results found for: PREALBUMIN CBC EXTENDED Latest Ref Rng & Units 08/11/2017  WBC 3.4 - 10.8 x10E3/uL 4.7  RBC 3.77 - 5.28 x10E6/uL 3.93  HGB 11.1 - 15.9 g/dL 11.9  HCT 34.0 - 46.6 % 37.4  PLT 150 - 450 x10E3/uL 215     Body mass index is 33.57 kg/m.  Orders:  Orders Placed This Encounter  Procedures  . XR Lumbar Spine 2-3 Views   Meds ordered this encounter  Medications  . methocarbamol (ROBAXIN) 500 MG tablet    Sig: Take 1 tablet (500 mg total) by mouth every 6 (six) hours as needed for muscle spasms (muscle spasm).    Dispense:  30 tablet    Refill:  0  . predniSONE (DELTASONE) 10 MG tablet    Sig: Take 2 tablets (20 mg total) by mouth daily with breakfast.    Dispense:  60 tablet    Refill:  0     Procedures: No procedures performed  Clinical Data: No additional  findings.  ROS:  All other systems negative, except as noted in the HPI. Review of Systems  Objective: Vital Signs: Ht 5\' 6"  (1.676 m)   Wt 208 lb (94.3 kg)   BMI 33.57 kg/m   Specialty Comments:  No specialty comments available.  PMFS History: Patient Active Problem List   Diagnosis Date Noted  . Chronic right shoulder pain 03/07/2018  . Chronic migraine 08/11/2017   Past Medical History:  Diagnosis Date  . Breast cancer [facility])    age 73  . Headache   . Vision abnormalities     Family History  Problem Relation Age of Onset  . Hyperlipidemia Mother   . Hypertension Mother   . Alzheimer's disease Father   . Hypertension Sister   . Hypertension Sister   . Hypertension Sister   . Hypertension Brother   . Lung cancer Brother   . Hypertension Sister   . Hypertension Brother     Past Surgical History:  Procedure Laterality Date  . BACK SURGERY      lumbar region  . BUNIONECTOMY Bilateral   . MASTECTOMY Left    at age 59  . TRIGGER FINGER RELEASE Left    left thumb  . TUBAL LIGATION     Social History   Occupational History  . Not on file  Tobacco Use  . Smoking status: Never Smoker  . Smokeless tobacco: Never Used  Substance and Sexual Activity  . Alcohol use: Never  . Drug use: Never  . Sexual activity: Not Currently

## 2019-07-19 ENCOUNTER — Telehealth: Payer: Self-pay

## 2019-07-19 NOTE — Telephone Encounter (Signed)
How would you like patient to taper off prednisone? Please see message below.

## 2019-07-19 NOTE — Telephone Encounter (Signed)
Patient would like to know if she could start weaning off of the Prednisone that she was prescribed for lumbar back strain.  CB# [phone].  Please advise  Thank you.

## 2019-07-19 NOTE — Telephone Encounter (Signed)
Called and spoke with patient to take 1 pill for 3 days then a pill every other day for 2 days. She did say her back was better

## 2019-08-01 ENCOUNTER — Encounter (HOSPITAL_COMMUNITY): Payer: Self-pay | Admitting: *Deleted

## 2019-08-01 ENCOUNTER — Emergency Department (HOSPITAL_COMMUNITY)
Admission: EM | Admit: 2019-08-01 | Discharge: 2019-08-01 | Disposition: A | Discharge status: Home / Self Care | Payer: Medicare Other | Attending: Emergency Medicine | Admitting: Emergency Medicine

## 2019-08-01 ENCOUNTER — Emergency Department (HOSPITAL_COMMUNITY): Payer: Medicare Other

## 2019-08-01 DIAGNOSIS — Z853 Personal history of malignant neoplasm of breast: Secondary | ICD-10-CM | POA: Insufficient documentation

## 2019-08-01 DIAGNOSIS — Z79899 Other long term (current) drug therapy: Secondary | ICD-10-CM | POA: Diagnosis not present

## 2019-08-01 DIAGNOSIS — M25511 Pain in right shoulder: Secondary | ICD-10-CM | POA: Diagnosis not present

## 2019-08-01 LAB — BASIC METABOLIC PANEL
Anion gap: 9 (ref 5–15)
BUN: 22 mg/dL (ref 8–23)
CO2: 26 mmol/L (ref 22–32)
Calcium: 9.5 mg/dL (ref 8.9–10.3)
Chloride: 105 mmol/L (ref 98–111)
Creatinine, Ser: 1.02 mg/dL — ABNORMAL HIGH (ref 0.44–1.00)
GFR calc Af Amer: >60 mL/min (ref >60)
GFR calc non Af Amer: 55 mL/min — ABNORMAL LOW (ref >60)
Glucose, Bld: 124 mg/dL — ABNORMAL HIGH (ref 70–99)
Potassium: 3.8 mmol/L (ref 3.5–5.1)
Sodium: 140 mmol/L (ref 135–145)

## 2019-08-01 LAB — CBC
HCT: 38.6 % (ref 36.0–46.0)
Hemoglobin: 12.6 g/dL (ref 12.0–15.0)
MCH: 30.9 pg (ref 26.0–34.0)
MCHC: 32.6 g/dL (ref 30.0–36.0)
MCV: 94.6 fL (ref 80.0–100.0)
Platelets: 226 10*3/uL (ref 150–400)
RBC: 4.08 MIL/uL (ref 3.87–5.11)
RDW: 13.7 % (ref 11.5–15.5)
WBC: 7.4 10*3/uL (ref 4.0–10.5)
nRBC: 0 % (ref 0.0–0.2)

## 2019-08-01 LAB — TROPONIN I (HIGH SENSITIVITY)
Troponin I (High Sensitivity): 6 ng/L (ref <18)
Troponin I (High Sensitivity): 7 ng/L (ref <18)

## 2019-08-01 MED ORDER — SODIUM CHLORIDE 0.9% FLUSH: 3.0000 mL | Freq: Once | INTRAVENOUS | Status: DC

## 2019-08-01 MED ORDER — LIDOCAINE 5 % EX PTCH: 1.0000 | MEDICATED_PATCH | CUTANEOUS | 0 refills | Status: AC

## 2019-08-01 NOTE — ED Triage Notes (Signed)
To ED for eval of right shoulder pain for past Sunday. No injury. No chest pain. Movement of arm increases pain. No nausea. No vomiting. Appears in nad. Pt has taken gas relief med without success.

## 2019-08-01 NOTE — ED Provider Notes (Signed)
[facility] EMERGENCY DEPARTMENT Provider Note   CSN: [id number] Arrival date & time: 08/01/19  1052     History Chief Complaint  Patient presents with  . Shoulder Pain    Anna [name] is a 74 y.o. female with pertinent past medical history of breast cancer, migraine that presents to the emergency department today for right posterior shoulder pain for the past 3 days.  Patient does not have any cardiac history.  Patient states that pain is in her right scapula on the posterior side.  States that she has had rotator cuff tears on the right shoulder previous to this.  States that the pain feels slightly different, does admit to pain radiating down her right side.  Denies any paresthesias.  States that the pain is constant.  Denies any chest pain, shortness of breath, diaphoresis, nausea, vomiting, abdominal pain, back pain, weakness, gait abnormalities, headache, vision changes, leg edema.  Patient states that she did fall last month onto her forehead and think she hit her right shoulder when she came down, has not seen anyone about this.  Pain is not exertional.  Patient states that she has been helping her husband move into a nursing home, has been very difficult for her because she has been lifting him by herself and bearing his weight.  Daughter is present in the room who states that she has been lifting a lot.  Per chart review patient did see Dr. Johnsie Cancel, cardiology in February 2019 for right scapular pain, they did a stress echo which looks normal.    Past Medical History:  Diagnosis Date  . Breast cancer [facility])    age 43  . Headache   . Vision abnormalities     Patient Active Problem List   Diagnosis Date Noted  . Chronic right shoulder pain 03/07/2018  . Chronic migraine 08/11/2017    Past Surgical History:  Procedure Laterality Date  . BACK SURGERY     lumbar region  . BUNIONECTOMY Bilateral   . MASTECTOMY Left    at age 72  . TRIGGER FINGER RELEASE  Left    left thumb  . TUBAL LIGATION       OB History   No obstetric history on file.     Family History  Problem Relation Age of Onset  . Hyperlipidemia Mother   . Hypertension Mother   . Alzheimer's disease Father   . Hypertension Sister   . Hypertension Sister   . Hypertension Sister   . Hypertension Brother   . Lung cancer Brother   . Hypertension Sister   . Hypertension Brother     Social History   Tobacco Use  . Smoking status: Never Smoker  . Smokeless tobacco: Never Used  Substance Use Topics  . Alcohol use: Never  . Drug use: Never    Home Medications Prior to Admission medications   Medication Sig Start Date End Date Taking? Authorizing Provider  acetaminophen (TYLENOL) 325 MG tablet Take 650 mg by mouth every 6 (six) hours as needed.    [provider]  Calcium 600-200 MG-UNIT tablet Take 2 tablets by mouth daily.    [provider]  ferrous sulfate (SLOW IRON) 160 (50 Fe) MG TBCR SR tablet Take 1 tablet by mouth daily.    [provider]  gabapentin (NEURONTIN) 100 MG capsule TAKE 1 CAPSULE BY MOUTH THREE TIMES DAILY 09/06/18   [name], PA-C  IBUPROFEN PO Take 800 mg tablet by mouth 1-3  times daily    [provider]  levothyroxine (SYNTHROID, LEVOTHROID) 88 MCG tablet Take 88 mcg by mouth daily before breakfast.    [provider]  lidocaine (LIDODERM) 5 % Place 1 patch onto the skin daily. Remove & Discard patch within 12 hours or as directed by MD 08/01/19   [name], PA-C  methocarbamol (ROBAXIN) 500 MG tablet Take 1 tablet (500 mg total) by mouth every 6 (six) hours as needed for muscle spasms (muscle spasm). 07/12/19   Persons, [name], PA  omeprazole (PRILOSEC) 20 MG capsule Take 20 mg by mouth daily.    [provider]  pravastatin (PRAVACHOL) 40 MG tablet Take 40 mg by mouth daily.  12/28/16   [provider]  predniSONE (DELTASONE) 10 MG tablet Take 2 tablets (20 mg total) by  mouth daily with breakfast. 07/12/19   Persons, [name], PA  SUMAtriptan (IMITREX) 25 MG tablet Take 1 tab at onset of migraine.  May repeat in 2 hrs, if needed.  Max dose: 2 tabs/day. This is a 30 day prescription. 10/03/18   [name], MD  tiZANidine (ZANAFLEX) 4 MG tablet Take 1 tablet (4 mg total) by mouth every 6 (six) hours as needed for muscle spasms. 04/04/18   [name], MD    Allergies    Sulfa antibiotics  Review of Systems   Review of Systems  Constitutional: Negative for chills, diaphoresis, fatigue and fever.  HENT: Negative for congestion, sore throat and trouble swallowing.   Eyes: Negative for pain and visual disturbance.  Respiratory: Negative for cough, shortness of breath and wheezing.   Cardiovascular: Negative for chest pain, palpitations and leg swelling.  Gastrointestinal: Negative for abdominal distention, abdominal pain, diarrhea, nausea and vomiting.  Genitourinary: Negative for difficulty urinating.  Musculoskeletal: Positive for arthralgias (R shoulder pain). Negative for back pain, neck pain and neck stiffness.  Skin: Negative for pallor.  Neurological: Negative for dizziness, speech difficulty, weakness and headaches.  Psychiatric/Behavioral: Negative for confusion.    Physical Exam Updated Vital Signs BP (!) 108/48 (BP Location: Right Arm)   Pulse 67   Temp 98.2 F (36.8 C) (Oral)   Resp 18   SpO2 100%   Physical Exam Constitutional:      General: She is not in acute distress.    Appearance: Normal appearance. She is not ill-appearing, toxic-appearing or diaphoretic.  HENT:     Mouth/Throat:     Mouth: Mucous membranes are moist.     Pharynx: Oropharynx is clear.  Eyes:     General: No scleral icterus.    Extraocular Movements: Extraocular movements intact.     Pupils: Pupils are equal, round, and reactive to light.  Cardiovascular:     Rate and Rhythm: Normal rate and regular rhythm.     Pulses: Normal pulses.     Heart  sounds: Normal heart sounds.  Pulmonary:     Effort: Pulmonary effort is normal. No respiratory distress.     Breath sounds: Normal breath sounds. No stridor. No wheezing, rhonchi or rales.  Chest:     Chest wall: No tenderness.  Abdominal:     General: Abdomen is flat. There is no distension.     Palpations: Abdomen is soft.     Tenderness: There is no abdominal tenderness. There is no guarding or rebound.  Musculoskeletal:        General: No swelling or tenderness. Normal range of motion.     Cervical back: Normal range of motion  and neck supple. No rigidity.     Right lower leg: No edema.     Left lower leg: No edema.     Comments: Full range of motion to right shoulder.  Pain is slightly increased with passive range of motion in all directions, no tenderness to palpation of scapula..  Patient denies any pain to palpation of back or chest wall.  No tenderness to serratus muscles over ribs.  Patient without any upper extremity swelling.  No weakness to right shoulder, elbow, wrist.  Normal sensation throughout.  Cap refill normal.  Radial pulse 2+ bilaterally.  No abnormalities to left upper extremity  Skin:    General: Skin is warm and dry.     Capillary Refill: Capillary refill takes less than 2 seconds.     Coloration: Skin is not pale.  Neurological:     General: No focal deficit present.     Mental Status: She is alert and oriented to person, place, and time.  Psychiatric:        Mood and Affect: Mood normal.        Behavior: Behavior normal.     ED Results / Procedures / Treatments   Labs (all labs ordered are listed, but only abnormal results are displayed) Labs Reviewed  BASIC METABOLIC PANEL - Abnormal; Notable for the following components:      Result Value   Glucose, Bld 124 (*)    Creatinine, Ser 1.02 (*)    GFR calc non Af Amer 55 (*)    All other components within normal limits  CBC  TROPONIN I (HIGH SENSITIVITY)  TROPONIN I (HIGH SENSITIVITY)    EKG EKG  Interpretation  Date/Time:  Tuesday August 01 2019 11:28:22 EDT Ventricular Rate:  90 PR Interval:  156 QRS Duration: 80 QT Interval:  336 QTC Calculation: 411 R Axis:   69 Text Interpretation: Normal sinus rhythm Nonspecific ST abnormality Abnormal ECG No old tracing to compare Confirmed by Sherwood Gambler [phone]) on 08/01/2019 3:47:31 PM   Radiology DG Shoulder Right  Result Date: 08/01/2019 CLINICAL DATA:  Fall 1 month ago.  Right shoulder pain EXAM: RIGHT SHOULDER - 2+ VIEW COMPARISON:  12/27/2017 FINDINGS: Degenerative changes in the right shoulder most pronounced in the right [facility] joint. Joint space narrowing and spurring. No acute bony abnormality. Specifically, no fracture, subluxation, or dislocation. IMPRESSION: No acute bony abnormality. Electronically Signed   By: Rolm Baptise M.D.   On: 08/01/2019 17:23    Procedures Procedures (including critical care time)  Medications Ordered in ED Medications  sodium chloride flush (NS) 0.9 % injection 3 mL (has no administration in time range)    ED Course  I have reviewed the triage vital signs and the nursing notes.  Pertinent labs & imaging results that were available during my care of the patient were reviewed by me and considered in my medical decision making (see chart for details).    MDM Rules/Calculators/A&P                         Anna [name] is a 74 y.o. female with pertinent past medical history of breast cancer, migraine that presents to the emergency department today for right posterior shoulder pain for the past 3 days.EKG with signs of mild ST depression in V3 and V4, did speak to Dr. Regenia Skeeter about this who commented that this was unchanged from last time.  He does not think that we need to consult cardiologist  at this time.  CBC and CMP stable.  2 negative flat troponins.  Shoulder x-ray without any bony abnormalities.  Does show degenerative changes.  Do not think ACS is likely at this time, patient did have same  type of pain in 2019 and did see Dr. Johnsie Cancel for this.  Dr. Johnsie Cancel did echo which was negative.  Patient is not complaining of any chest pain.  Shared decision making with patient and daughter about speaking to cardiology now about this type of pain, they state that they will  see the orthopedic doctor tomorrow for disposition.Patient does have orthopedic doctor appointment tomorrow.  Patient is present with daughter, they seem reliable and state that they will follow-up with orthopedist tomorrow.  Will give Lidoderm patches at this time.  Did express the importance if pain becomes cardiac in nature to come back to the ER, did go over this very thoroughly.  Patient is to be discharged with recommendation to follow up with PCP in regards to today's hospital visit. Chest pain is not likely of cardiacetiology d/t presentation,  VSS, no JVD or new murmur, RRR, breath sounds equal bilaterally, EKG without acute abnormalities, negative troponin. Pt has been advised to return to the ED if CP becomes exertional, associated with diaphoresis or nausea, radiates to left jaw/arm, worsens or becomes concerning in any way. Pt appears reliable for follow up and is agreeable to discharge.   I discussed this case with my attending physician, Dr. Regenia Skeeter who cosigned this note including patient's presenting symptoms, physical exam, and planned diagnostics and interventions. Attending physician stated agreement with plan or made changes to plan which were implemented.     Final Clinical Impression(s) / ED Diagnoses Final diagnoses:  Acute pain of right shoulder    Rx / DC Orders ED Discharge Orders         Ordered    lidocaine (LIDODERM) 5 %  Every 24 hours     Discontinue  Reprint     08/01/19 1756           [name], PA-C 08/01/19 2024    Drenda Freeze, MD 08/01/19 2306

## 2019-08-01 NOTE — Discharge Instructions (Addendum)
You are seen today for shoulder pain, this is most likely due to your previous fall or impingement syndrome from your rotator cuff injuries.  I want you to follow-up with your orthopedist tomorrow as we discussed.  You can also use the attached instructions above.  Use the Lidoderm patch as prescribed, you can also use Tylenol or ibuprofen as prescribed on the bottle.  As discussed, there were slight EKG abnormalities, they do look similar to your previous EKG, however you can still follow-up with Dr. Johnsie Cancel as we spoke about if you want to.    Read instructions below for reasons to return to the Emergency Department. It is recommended that your follow up with your Primary Care Doctor in regards to today's visit. If you do not have a doctor, use the resource guide listed below to help you find one. Begin taking over the counter Prilosec or Zegrid as directed.   Chest Pain (Nonspecific)  HOME CARE INSTRUCTIONS  For the next few days, avoid physical activities that bring on chest pain. Continue physical activities as directed.  Do not smoke cigarettes or drink alcohol until your symptoms are gone.  Only take over-the-counter or prescription medicine for pain, discomfort, or fever as directed by your caregiver.  Follow your caregiver's suggestions for further testing if your chest pain does not go away.  Keep any follow-up appointments you made. If you do not go to an appointment, you could develop lasting (chronic) problems with pain. If there is any problem keeping an appointment, you must call to reschedule.  SEEK MEDICAL CARE IF:  You think you are having problems from the medicine you are taking. Read your medicine instructions carefully.  Your chest pain does not go away, even after treatment.  You develop a rash with blisters on your chest.  SEEK IMMEDIATE MEDICAL CARE IF:  You have increased chest pain or pain that spreads to your arm, neck, jaw, back, or belly (abdomen).  You develop  shortness of breath, an increasing cough, or you are coughing up blood.  You have severe back or abdominal pain, feel sick to your stomach (nauseous) or throw up (vomit).  You develop severe weakness, fainting, or chills.  You have an oral temperature above 102 F (38.9 C), not controlled by medicine.   THIS IS AN EMERGENCY. Do not wait to see if the pain will go away. Get medical help at once. Call your local emergency services (911 in [place] Do not drive yourself to the ED.

## 2019-08-02 ENCOUNTER — Other Ambulatory Visit: Payer: Self-pay

## 2019-08-02 ENCOUNTER — Ambulatory Visit: Payer: Medicare Other | Admitting: Physician Assistant

## 2019-08-02 ENCOUNTER — Encounter: Payer: Self-pay | Admitting: Physician Assistant

## 2019-08-02 DIAGNOSIS — G8929 Other chronic pain: Secondary | ICD-10-CM | POA: Diagnosis not present

## 2019-08-02 DIAGNOSIS — M25511 Pain in right shoulder: Secondary | ICD-10-CM | POA: Diagnosis not present

## 2019-08-02 MED ORDER — MELOXICAM 7.5 MG PO TABS: 7.5000 mg | ORAL_TABLET | Freq: Every day | ORAL | 0 refills | Status: DC

## 2019-08-02 NOTE — Progress Notes (Signed)
Office Visit Note   Patient: Anna [name]           Date of Birth: [birth year]           MRN: [id number] Visit Date: 08/02/2019              Requested by: [name], MD [address] [place],  [place] [address] PCP: [name], MD  Chief Complaint  Patient presents with  . Right Shoulder - Follow-up, Pain, Injury      HPI: This is a pleasant 74 year old woman with ongoing right shoulder and scapular pain.  She had been on prednisone given to me for her lower back pain but discontinued this because it gave her headache.  She said that the right shoulder pain increased after a fall about a month ago.  She focuses it like it is under her scapula and shoots down her arm.  She does have a history she has seen Dr. Ninfa Linden in the past for bilateral shoulder issues a year ago.  MRIs did demonstrate rotator cuff tears.  She was given injections but she does not think they really helped her  Assessment & Plan: Visit Diagnoses:  1. Chronic right shoulder pain     Plan: Findings more suggesting cervical radiculopathy rather than shoulder.  I would recommend x-rays of her C-spine at her next visit but she would like to try some physical therapy first.  If she does not improve I would recommend an MRI with possible ESI.  Follow-Up Instructions: No follow-ups on file.   Ortho Exam  Patient is alert, oriented, no adenopathy, well-dressed, normal affect, normal respiratory effort. Focused examination she does not really have any limited range of motion with her shoulder.  Points to most of her pain as being under her scapula.  Says this is increased some of her symptoms when she turns her head side to side.  No pain with extension or flexion of her neck.  No palpable abnormalities.  Distal strength and CMS in her arm is intact  Imaging: DG Shoulder Right  Result Date: 08/01/2019 CLINICAL DATA:  Fall 1 month ago.  Right shoulder pain EXAM: RIGHT SHOULDER - 2+ VIEW  COMPARISON:  12/27/2017 FINDINGS: Degenerative changes in the right shoulder most pronounced in the right [facility] joint. Joint space narrowing and spurring. No acute bony abnormality. Specifically, no fracture, subluxation, or dislocation. IMPRESSION: No acute bony abnormality. Electronically Signed   By: Rolm Baptise M.D.   On: 08/01/2019 17:23   No images are attached to the encounter.  Labs: Lab Results  Component Value Date   ESRSEDRATE 7 08/11/2017   CRP <1 08/11/2017     Lab Results  Component Value Date   ALBUMIN 4.2 08/11/2017    No results found for: MG No results found for: VD25OH  No results found for: PREALBUMIN CBC EXTENDED Latest Ref Rng & Units 08/01/2019 08/11/2017  WBC 4.0 - 10.5 K/uL 7.4 4.7  RBC 3.87 - 5.11 MIL/uL 4.08 3.93  HGB 12.0 - 15.0 g/dL 12.6 11.9  HCT 36 - 46 % 38.6 37.4  PLT 150 - 400 K/uL 226 215     There is no height or weight on file to calculate BMI.  Orders:  Orders Placed This Encounter  Procedures  . Ambulatory referral to Physical Therapy   Meds ordered this encounter  Medications  . meloxicam (MOBIC) 7.5 MG tablet    Sig: Take 1 tablet (7.5 mg total) by mouth daily.  Dispense:  30 tablet    Refill:  0     Procedures: No procedures performed  Clinical Data: No additional findings.  ROS:  All other systems negative, except as noted in the HPI. Review of Systems  Objective: Vital Signs: There were no vitals taken for this visit.  Specialty Comments:  No specialty comments available.  PMFS History: Patient Active Problem List   Diagnosis Date Noted  . Chronic right shoulder pain 03/07/2018  . Chronic migraine 08/11/2017   Past Medical History:  Diagnosis Date  . Breast cancer [facility])    age 72  . Headache   . Vision abnormalities     Family History  Problem Relation Age of Onset  . Hyperlipidemia Mother   . Hypertension Mother   . Alzheimer's disease Father   . Hypertension Sister   . Hypertension Sister   .  Hypertension Sister   . Hypertension Brother   . Lung cancer Brother   . Hypertension Sister   . Hypertension Brother     Past Surgical History:  Procedure Laterality Date  . BACK SURGERY     lumbar region  . BUNIONECTOMY Bilateral   . MASTECTOMY Left    at age 42  . TRIGGER FINGER RELEASE Left    left thumb  . TUBAL LIGATION     Social History   Occupational History  . Not on file  Tobacco Use  . Smoking status: Never Smoker  . Smokeless tobacco: Never Used  Substance and Sexual Activity  . Alcohol use: Never  . Drug use: Never  . Sexual activity: Not Currently

## 2019-08-04 ENCOUNTER — Other Ambulatory Visit: Payer: Self-pay

## 2019-08-04 ENCOUNTER — Ambulatory Visit (INDEPENDENT_AMBULATORY_CARE_PROVIDER_SITE_OTHER): Payer: Medicare Other

## 2019-08-04 ENCOUNTER — Encounter: Payer: Self-pay | Admitting: Podiatry

## 2019-08-04 ENCOUNTER — Ambulatory Visit: Payer: Medicare Other | Admitting: Podiatry

## 2019-08-04 DIAGNOSIS — B351 Tinea unguium: Secondary | ICD-10-CM | POA: Diagnosis not present

## 2019-08-04 DIAGNOSIS — M216X2 Other acquired deformities of left foot: Secondary | ICD-10-CM

## 2019-08-04 DIAGNOSIS — M216X1 Other acquired deformities of right foot: Secondary | ICD-10-CM | POA: Diagnosis not present

## 2019-08-04 DIAGNOSIS — M2060 Acquired deformities of toe(s), unspecified, unspecified foot: Secondary | ICD-10-CM | POA: Diagnosis not present

## 2019-08-04 DIAGNOSIS — L84 Corns and callosities: Secondary | ICD-10-CM | POA: Diagnosis not present

## 2019-08-04 DIAGNOSIS — M79674 Pain in right toe(s): Secondary | ICD-10-CM | POA: Diagnosis not present

## 2019-08-04 DIAGNOSIS — M79675 Pain in left toe(s): Secondary | ICD-10-CM | POA: Diagnosis not present

## 2019-08-09 NOTE — Progress Notes (Signed)
Subjective:   Patient ID: Anna [name], female   DOB: 74 y.o.   MRN: [id number]   HPI Patient presents stating she has trouble with her nails of both feet and has a lesion on the right foot second toe which has been painful and making it hard to wear shoe gear comfortably.  Patient states that this is been ongoing and she cannot take care of them herself and patient does not smoke and likes to be active   Review of Systems  All other systems reviewed and are negative.       Objective:  Physical Exam Vitals and nursing note reviewed.  Constitutional:      Appearance: She is well-developed.  Pulmonary:     Effort: Pulmonary effort is normal.  Musculoskeletal:        General: Normal range of motion.  Skin:    General: Skin is warm.  Neurological:     Mental Status: She is alert.     Neurovascular status found to be intact muscle strength was found to be adequate range of motion was within normal limits.  Patient is noted to have thick yellow brittle nailbeds 1-5 both feet that are dystrophic moderate digital deformities bilateral with keratotic lesion second digit with moderate change in the position of the toe.  Patient has good digital perfusion well oriented x3     Assessment:  Mycotic nail infection with pain 1-5 both feet along with keratotic lesion painful secondary to structural alignment and pressure against digital deformity     Plan:  H&P reviewed all conditions and discussed.  At this point debridement of nailbeds and lesion occurred with no iatrogenic bleeding and I then discussed padding therapy shoe gear modifications and soaks as needed.  She can use topical medicines and oral anti-inflammatories as needed if symptoms should get worse and will be seen back for routine care  X-rays indicate that there is moderate structural malalignment of both feet with digital deformity occurring right over left foot

## 2019-08-16 DIAGNOSIS — M25512 Pain in left shoulder: Secondary | ICD-10-CM | POA: Diagnosis not present

## 2019-08-18 ENCOUNTER — Ambulatory Visit: Payer: Medicare Other | Admitting: Rehabilitative and Restorative Service Providers"

## 2019-08-18 ENCOUNTER — Encounter: Payer: Self-pay | Admitting: Rehabilitative and Restorative Service Providers"

## 2019-08-18 ENCOUNTER — Other Ambulatory Visit: Payer: Self-pay

## 2019-08-18 DIAGNOSIS — G8929 Other chronic pain: Secondary | ICD-10-CM | POA: Diagnosis not present

## 2019-08-18 DIAGNOSIS — M6281 Muscle weakness (generalized): Secondary | ICD-10-CM | POA: Diagnosis not present

## 2019-08-18 DIAGNOSIS — M25511 Pain in right shoulder: Secondary | ICD-10-CM

## 2019-08-18 DIAGNOSIS — R293 Abnormal posture: Secondary | ICD-10-CM

## 2019-08-18 DIAGNOSIS — M542 Cervicalgia: Secondary | ICD-10-CM

## 2019-08-18 NOTE — Patient Instructions (Signed)
Access Code: [email or web address] URL: [email or web address] Date: 08/18/2019 Prepared by: Scot Jun  Exercises Seated Scapular Retraction - 2 x daily - 7 x weekly - 10 reps - 2 sets - 5 hold Standing Shoulder Posterior Capsule Stretch - 2 x daily - 7 x weekly - 1 sets - 5 reps - 15 hold

## 2019-08-18 NOTE — Therapy (Signed)
[facility] Physical Therapy [address] [place], [place], [address] Phone: [phone]   Fax:  [phone]  Physical Therapy Evaluation  Patient Details  Name: Anna [name] MRN: [id number] Date of Birth: 08-13-1945 Referring Provider (PT): [name] Persons, PA-C   Encounter Date: 08/18/2019   PT End of Session - 08/18/19 1139    Visit Number 1    Number of Visits 16    Date for PT Re-Evaluation 10/13/19    Progress Note Due on Visit 10    PT Start Time 1102    PT Stop Time 1139    PT Time Calculation (min) 37 min    Activity Tolerance Patient tolerated treatment well    Behavior During Therapy [facility] for tasks assessed/performed           Past Medical History:  Diagnosis Date  . Breast cancer [facility])    age 82  . Headache   . Vision abnormalities     Past Surgical History:  Procedure Laterality Date  . BACK SURGERY     lumbar region  . BUNIONECTOMY Bilateral   . MASTECTOMY Left    at age 66  . TRIGGER FINGER RELEASE Left    left thumb  . TUBAL LIGATION      There were no vitals filed for this visit.    Subjective Assessment - 08/18/19 1059    Subjective Pt. indicated chronic pain history with complaints of Rt shoulder/neck pain at times.  Pt. stated medication has seemed to help along the way.    Pertinent History history of ED visit, prednisone but discontinued due to headache.  Fall about 1-2 months ago, RT UE symptoms noted.  Injections in past but not helpful.  Imaging on shoulder in past with rotator cuff tears evident    Limitations Lifting    Patient Stated Goals Reduce pain    Currently in Pain? Yes    Pain Score 6    at worst 6/10   Pain Location Shoulder    Pain Orientation Right   superior/posterior   Pain Descriptors / Indicators Aching    Pain Type Chronic pain    Pain Onset More than a month ago    Pain Frequency Constant    Aggravating Factors  standing movement, arm reaching/lifting.    Pain Relieving Factors  Medicine    Effect of Pain on Daily Activities Pain c daily activity              [facility] PT Assessment - 08/18/19 0001      Assessment   Medical Diagnosis Chronic Rt Shoulder/neck pain    Referring Provider (PT) [name] Persons, PA-C    Onset Date/Surgical Date 06/03/19   worsened symptoms   Hand Dominance Right      Precautions   Precautions None      Restrictions   Weight Bearing Restrictions No      Balance Screen   Has the patient fallen in the past 6 months Yes    How many times? 1    Has the patient had a decrease in activity level because of a fear of falling?  Yes   2/2 symptoms   Is the patient reluctant to leave their home because of a fear of falling?  No      Home Environment   Living Environment Private residence    Home Access Stairs to enter    Entrance Stairs-Number of Steps 4-5 in front      Prior Function  Level of Independence Independent    Leisure Laundry, house, walking       Cognition   Overall Cognitive Status Within Functional Limits for tasks assessed      Posture/Postural Control   Posture/Postural Control Postural limitations    Postural Limitations Rounded Shoulders;Forward head;Increased thoracic kyphosis   protracted scapulae bilateral     ROM / Strength   AROM / PROM / Strength Strength;PROM;AROM      AROM   Overall AROM Comments Cervical Lt and Rt rotation produced tightness in Rt posterior shoulder.  Shoulder elevation above 100 deg produced shrug bilateral Rt > Lt    AROM Assessment Site Cervical;Shoulder    Right/Left Shoulder Left;Right    Left Shoulder Flexion 135 Degrees   measured in supine,   Left Shoulder ABduction 130 Degrees   measured in supine,   Left Shoulder Internal Rotation 60 Degrees   measured in supine, 45 deg abd   Left Shoulder External Rotation 75 Degrees   measured in supine, 45 deg abd   Cervical Extension 30    Cervical - Right Rotation 35    Cervical - Left Rotation 40      PROM   Overall PROM  Comments Pain Rt shoulder at end range all movements    PROM Assessment Site Shoulder    Right/Left Shoulder Right    Right Shoulder Flexion 140 Degrees    Right Shoulder ABduction 135 Degrees    Right Shoulder Internal Rotation 60 Degrees    Right Shoulder External Rotation 80 Degrees      Strength   Strength Assessment Site Elbow;Shoulder    Right/Left Shoulder Left;Right    Right Shoulder Flexion 4-/5    Right Shoulder ABduction 3+/5    Right Shoulder Internal Rotation 5/5    Right Shoulder External Rotation 4+/5    Left Shoulder Flexion 4/5    Left Shoulder ABduction 4-/5    Left Shoulder Internal Rotation 5/5    Left Shoulder External Rotation 5/5    Right/Left Elbow Left;Right      Palpation   Palpation comment palption tenderness, TrP noted in Rt infraspinatus/supraspinatus      Special Tests   Other special tests Painful arc, shrug sign (+) bilateral   Jt mobility WFL c mild guarding                     Objective measurements completed on examination: See above findings.       [facility] Adult PT Treatment/Exercise - 08/18/19 0001      Exercises   Exercises Other Exercises    Other Exercises  HEP instruction c performance, cues for procedures and techniques:  scapular retraction, cross arm stretch      Manual Therapy   Manual therapy comments active compression c movement to Rt infraspinatus                  PT Education - 08/18/19 1144    Education Details HEP, POC, DN    Person(s) Educated Patient    Methods Explanation;Demonstration;Handout    Comprehension Returned demonstration;Verbalized understanding            PT Short Term Goals - 08/18/19 1144      PT SHORT TERM GOAL #1   Title Pt. will demonstrate independent use of starter HEP    Status New    Target Date 09/01/19             PT Long Term Goals - 08/18/19 1142  PT LONG TERM GOAL #1   Baseline Patient will demonstrate/report pain at worst less than or equal to  2/10 to facilitate minimal limitation in daily activity secondary to pain symptoms.    Status New    Target Date 10/13/19      PT LONG TERM GOAL #2   Title Patient will demonstrate independent use of home exercise program to facilitate ability to maintain/progress functional gains from skilled physical therapy services.    Status New    Target Date 10/13/19      PT LONG TERM GOAL #3   Title Patient will demonstrate return to recreational activity at previous level of function without limitations secondary due to condition    Status New    Target Date 10/13/19      PT LONG TERM GOAL #4   Title Patient will demonstrate Rt [place] joint mobility WFL to facilitate usual self care, dressing, reaching overhead at PLOF s limitation due to symptoms.    Status New    Target Date 10/13/19      PT LONG TERM GOAL #5   Title Patient will demonstrate Rt UE MMT 5/5 throughout to facilitate usual lifting, carrying in functional activity to PLOF s limitation.    Target Date 10/13/19      Additional Long Term Goals   Additional Long Term Goals Yes      PT LONG TERM GOAL #6   Title Pt. will demonstrate cervical AROM WFL s symptoms to facilitate usual daily mobility at PLOF s limitation.    Status New    Target Date 10/13/19                  Plan - 08/18/19 1100    Clinical Impression Statement Patient is a 74 y.o. female who comes to clinic with complaints of Lt shoulder/neck pain with mobility, strength deficits that impair their ability to perform usual daily and recreational functional activities without increase difficulty/symptoms at this time.  Patient to benefit from skilled PT services to address impairments and limitations to improve to previous level of function without restriction secondary to condition.    Personal Factors and Comorbidities Other   bilateral shoulder pain chronic, documented RTC tears   Examination-Activity Limitations Carry;Lift;Hygiene/Grooming;Sleep;Reach Overhead     Examination-Participation Restrictions Other   household activity   Stability/Clinical Decision Making Stable/Uncomplicated    Clinical Decision Making Low    Rehab Potential --   fair to good   PT Frequency --   1-2x/week   PT Duration 8 weeks    PT Treatment/Interventions ADLs/Self Care Home Management;Cryotherapy;Electrical Stimulation;Iontophoresis 4mg /ml Dexamethasone;Moist Heat;Traction;Therapeutic exercise;Therapeutic activities;Functional mobility training;Ultrasound;Neuromuscular re-education;Patient/family education;Manual techniques;Vasopneumatic Device;Taping;Dry needling;Passive range of motion;Spinal Manipulations;Joint Manipulations    PT Next Visit Plan Reassess HEP,  improve active/passive Rt shoulder movement, cervicothoracic mobilty    PT Home Exercise Plan [email or web address]    Consulted and Agree with Plan of Care Patient           Patient will benefit from skilled therapeutic intervention in order to improve the following deficits and impairments:  Decreased endurance, Hypomobility, Decreased activity tolerance, Decreased strength, Impaired UE functional use, Pain, Increased muscle spasms, Decreased mobility, Decreased range of motion, Impaired perceived functional ability, Postural dysfunction, Impaired flexibility  Visit Diagnosis: Chronic right shoulder pain  Cervicalgia  Muscle weakness (generalized)  Abnormal posture     Problem List Patient Active Problem List   Diagnosis Date Noted  . Chronic right shoulder pain 03/07/2018  . Chronic migraine 08/11/2017  Scot Jun, PT, DPT, OCS, ATC 08/18/19  11:51 AM    [facility] Physical Therapy [address] [place], [place], [address] Phone: [phone]   Fax:  [phone]  Name: Anna [name] MRN: [id number] Date of Birth: May 28, 1945

## 2019-08-22 ENCOUNTER — Other Ambulatory Visit: Payer: Self-pay

## 2019-08-22 ENCOUNTER — Ambulatory Visit: Payer: Medicare Other | Admitting: Rehabilitative and Restorative Service Providers"

## 2019-08-22 ENCOUNTER — Encounter: Payer: Self-pay | Admitting: Rehabilitative and Restorative Service Providers"

## 2019-08-22 DIAGNOSIS — M6281 Muscle weakness (generalized): Secondary | ICD-10-CM | POA: Diagnosis not present

## 2019-08-22 DIAGNOSIS — R293 Abnormal posture: Secondary | ICD-10-CM

## 2019-08-22 DIAGNOSIS — M542 Cervicalgia: Secondary | ICD-10-CM | POA: Diagnosis not present

## 2019-08-22 DIAGNOSIS — M25511 Pain in right shoulder: Secondary | ICD-10-CM | POA: Diagnosis not present

## 2019-08-22 DIAGNOSIS — G8929 Other chronic pain: Secondary | ICD-10-CM | POA: Diagnosis not present

## 2019-08-22 NOTE — Therapy (Signed)
[facility] Physical Therapy [address] [place], [place], [address] Phone: [phone]   Fax:  [phone]  Physical Therapy Treatment  Patient Details  Name: Anna [name] MRN: [id number] Date of Birth: Jun 30, 1945 Referring Provider (PT): [name] Persons, PA-C   Encounter Date: 08/22/2019   PT End of Session - 08/22/19 1528    Visit Number 2    Number of Visits 16    Date for PT Re-Evaluation 10/13/19    Progress Note Due on Visit 10    PT Start Time 1514    PT Stop Time 1552    PT Time Calculation (min) 38 min    Activity Tolerance Patient tolerated treatment well    Behavior During Therapy [facility] for tasks assessed/performed           Past Medical History:  Diagnosis Date  . Breast cancer [facility])    age 74  . Headache   . Vision abnormalities     Past Surgical History:  Procedure Laterality Date  . BACK SURGERY     lumbar region  . BUNIONECTOMY Bilateral   . MASTECTOMY Left    at age 36  . TRIGGER FINGER RELEASE Left    left thumb  . TUBAL LIGATION      There were no vitals filed for this visit.   Subjective Assessment - 08/22/19 1519    Subjective Pt. indicated feeling some improvement in symptoms after last visit until last night.  Some pain occasional reported.    Pertinent History history of ED visit, prednisone but discontinued due to headache.  Fall about 1-2 months ago, RT UE symptoms noted.  Injections in past but not helpful.  Imaging on shoulder in past with rotator cuff tears evident    Limitations Lifting    Patient Stated Goals Reduce pain    Currently in Pain? Yes    Pain Score 0-No pain   at worst 5/10   Pain Location Shoulder    Pain Orientation Right    Pain Descriptors / Indicators Aching    Pain Type Chronic pain    Pain Onset More than a month ago    Pain Frequency Occasional    Aggravating Factors  arm reaching/lifting    Pain Relieving Factors medicine, last visit.                              [facility] Adult PT Treatment/Exercise - 08/22/19 0001      Exercises   Exercises Shoulder      Shoulder Exercises: Standing   Extension Both;Strengthening;Other (comment)   3 x 10   Theraband Level (Shoulder Extension) Level 3 (Green)    Row Both   3 x 10   Theraband Level (Shoulder Row) Level 3 (Green)      Shoulder Exercises: ROM/Strengthening   UBE (Upper Arm Bike) 4 mins fwd/back each lvl 2      Manual Therapy   Manual therapy comments active compression c movement to Rt infraspinatus, Lt upper trap                    PT Short Term Goals - 08/18/19 1144      PT SHORT TERM GOAL #1   Title Pt. will demonstrate independent use of starter HEP    Status New    Target Date 09/01/19             PT Long Term Goals - 08/18/19  [place] #1   Baseline Patient will demonstrate/report pain at worst less than or equal to 2/10 to facilitate minimal limitation in daily activity secondary to pain symptoms.    Status New    Target Date 10/13/19      PT LONG TERM GOAL #2   Title Patient will demonstrate independent use of home exercise program to facilitate ability to maintain/progress functional gains from skilled physical therapy services.    Status New    Target Date 10/13/19      PT LONG TERM GOAL #3   Title Patient will demonstrate return to recreational activity at previous level of function without limitations secondary due to condition    Status New    Target Date 10/13/19      PT LONG TERM GOAL #4   Title Patient will demonstrate Rt [place] joint mobility WFL to facilitate usual self care, dressing, reaching overhead at PLOF s limitation due to symptoms.    Status New    Target Date 10/13/19      PT LONG TERM GOAL #5   Title Patient will demonstrate Rt UE MMT 5/5 throughout to facilitate usual lifting, carrying in functional activity to PLOF s limitation.    Target Date 10/13/19      Additional Long Term  Goals   Additional Long Term Goals Yes      PT LONG TERM GOAL #6   Title Pt. will demonstrate cervical AROM WFL s symptoms to facilitate usual daily mobility at PLOF s limitation.    Status New    Target Date 10/13/19                 Plan - 08/22/19 1540    Clinical Impression Statement Initial benefit noted from TrP release techniques for Rt shoulder.  Crepitus noted in ER mobility in clinic today, no pain associated.  Continued skilled PT services indicated at this time    Personal Factors and Comorbidities Other   bilateral shoulder pain chronic, documented RTC tears   Examination-Activity Limitations Carry;Lift;Hygiene/Grooming;Sleep;Reach Overhead    Examination-Participation Restrictions Other   household activity   Stability/Clinical Decision Making Stable/Uncomplicated    Rehab Potential --   fair to good   PT Frequency --   1-2x/week   PT Duration 8 weeks    PT Treatment/Interventions ADLs/Self Care Home Management;Cryotherapy;Electrical Stimulation;Iontophoresis 4mg /ml Dexamethasone;Moist Heat;Traction;Therapeutic exercise;Therapeutic activities;Functional mobility training;Ultrasound;Neuromuscular re-education;Patient/family education;Manual techniques;Vasopneumatic Device;Taping;Dry needling;Passive range of motion;Spinal Manipulations;Joint Manipulations    PT Next Visit Plan Cervicothoracic mobility improvements, myofascial release, progressive postural strengthening.    PT Home Exercise Plan [email or web address]    Consulted and Agree with Plan of Care Patient           Patient will benefit from skilled therapeutic intervention in order to improve the following deficits and impairments:  Decreased endurance, Hypomobility, Decreased activity tolerance, Decreased strength, Impaired UE functional use, Pain, Increased muscle spasms, Decreased mobility, Decreased range of motion, Impaired perceived functional ability, Postural dysfunction, Impaired flexibility  Visit  Diagnosis: Chronic right shoulder pain  Cervicalgia  Muscle weakness (generalized)  Abnormal posture     Problem List Patient Active Problem List   Diagnosis Date Noted  . Chronic right shoulder pain 03/07/2018  . Chronic migraine 08/11/2017    Scot Jun, PT, DPT, OCS, ATC 08/22/19  3:45 PM    [facility] [address] [place], [place], [address] Phone: [phone]   Fax:  [phone]  Name: Rainee Sweatt  Wil[place] MRN: [id number] Date of Birth: 28-Nov-1945

## 2019-08-30 ENCOUNTER — Ambulatory Visit: Payer: Self-pay

## 2019-08-30 ENCOUNTER — Encounter: Payer: Self-pay | Admitting: Family

## 2019-08-30 ENCOUNTER — Ambulatory Visit: Payer: Medicare Other | Admitting: Family

## 2019-08-30 VITALS — Ht 66.0 in | Wt 208.0 lb

## 2019-08-30 DIAGNOSIS — M542 Cervicalgia: Secondary | ICD-10-CM | POA: Diagnosis not present

## 2019-08-30 NOTE — Progress Notes (Signed)
Office Visit Note   Patient: LENNETTE FADER           Date of Birth: [birth year]           MRN: [id number] Visit Date: 08/30/2019              Requested by: [name], MD [address] [place],  [place] [address] PCP: [name], MD  Chief Complaint  Patient presents with  . Left Shoulder - Follow-up  . Right Shoulder - Follow-up  . Neck - Follow-up      HPI: The patient is a 74 year old woman who presents today in follow-up for bilateral shoulder pain as well as some numbness down the right upper extremity.  She has been in physical therapy just 2 visits thus far.  She feels this helps.  She complains of occasional loss of range of motion of the right shoulder occasionally cannot reach above her head.  The spasms that she answered her neck.  The pain that she is having appears to be 2 parts.  She is having some stabbing pain under her shoulder blade about a month ago when this came on and lasted for 3 days.  She felt that it might have been gas.  She denies any history of issues with her gallbladder   She is having shooting pain down the right upper extremity as well as some numbness this is not associated with any particular digits.  Denies any weakness   She had been on prednisone for her lower back pain but discontinued this because it gave her headache.  She said that the right shoulder pain increased after a fall about a month ago.  She focuses it like it is under her scapula and shoots down her arm.  She does have a history she has seen Dr. Ninfa Linden in the past for bilateral shoulder issues a year ago.  MRIs did demonstrate rotator cuff tears.  She was given injections but she does not think they really helped her  Assessment & Plan: Visit Diagnoses:  1. Neck pain     Plan: I agree that findings more suggesting cervical radiculopathy.  We will proceed with MRI.  She will continue with physical therapy for her shoulder issues  Follow-Up Instructions: No  follow-ups on file.   Back Exam   Tenderness  The patient is experiencing no tenderness.   Comments:  Negative spurling   Right Hand Exam   Muscle Strength  Grip: 5/5    Left Hand Exam   Muscle Strength  Grip:  5/5       Patient is alert, oriented, no adenopathy, well-dressed, normal affect, normal respiratory effort. Focused examination she does not really have any limited range of motion with her shoulder.  Points to most of her pain as being under her scapula.  No pain with extension or flexion of her neck.  No palpable abnormalities.  Distal strength and CMS in her arm is intact  Imaging: No results found. No images are attached to the encounter.  Labs: Lab Results  Component Value Date   ESRSEDRATE 7 08/11/2017   CRP <1 08/11/2017     Lab Results  Component Value Date   ALBUMIN 4.2 08/11/2017    No results found for: MG No results found for: VD25OH  No results found for: PREALBUMIN CBC EXTENDED Latest Ref Rng & Units 08/01/2019 08/11/2017  WBC 4.0 - 10.5 K/uL 7.4 4.7  RBC 3.87 - 5.11 MIL/uL 4.08 3.93  HGB 12.0 - 15.0 g/dL 12.6 11.9  HCT 36 - 46 % 38.6 37.4  PLT 150 - 400 K/uL 226 215     Body mass index is 33.57 kg/m.  Orders:  Orders Placed This Encounter  Procedures  . XR Cervical Spine 2 or 3 views   No orders of the defined types were placed in this encounter.    Procedures: No procedures performed  Clinical Data: No additional findings.  ROS:  All other systems negative, except as noted in the HPI. Review of Systems  Constitutional: Negative for chills and fever.  Musculoskeletal: Positive for myalgias and neck pain. Negative for joint swelling.  Neurological: Positive for numbness. Negative for weakness.    Objective: Vital Signs: Ht 5\' 6"  (1.676 m)   Wt (!) 208 lb (94.3 kg)   BMI 33.57 kg/m   Specialty Comments:  No specialty comments available.  PMFS History: Patient Active Problem List   Diagnosis Date Noted    . Chronic right shoulder pain 03/07/2018  . Chronic migraine 08/11/2017   Past Medical History:  Diagnosis Date  . Breast cancer [facility])    age 83  . Headache   . Vision abnormalities     Family History  Problem Relation Age of Onset  . Hyperlipidemia Mother   . Hypertension Mother   . Alzheimer's disease Father   . Hypertension Sister   . Hypertension Sister   . Hypertension Sister   . Hypertension Brother   . Lung cancer Brother   . Hypertension Sister   . Hypertension Brother     Past Surgical History:  Procedure Laterality Date  . BACK SURGERY     lumbar region  . BUNIONECTOMY Bilateral   . MASTECTOMY Left    at age 54  . TRIGGER FINGER RELEASE Left    left thumb  . TUBAL LIGATION     Social History   Occupational History  . Not on file  Tobacco Use  . Smoking status: Never Smoker  . Smokeless tobacco: Never Used  Substance and Sexual Activity  . Alcohol use: Never  . Drug use: Never  . Sexual activity: Not Currently

## 2019-09-01 ENCOUNTER — Other Ambulatory Visit: Payer: Self-pay

## 2019-09-01 ENCOUNTER — Ambulatory Visit: Payer: Medicare Other | Admitting: Physical Therapy

## 2019-09-01 DIAGNOSIS — M25511 Pain in right shoulder: Secondary | ICD-10-CM | POA: Diagnosis not present

## 2019-09-01 DIAGNOSIS — R293 Abnormal posture: Secondary | ICD-10-CM | POA: Diagnosis not present

## 2019-09-01 DIAGNOSIS — M542 Cervicalgia: Secondary | ICD-10-CM

## 2019-09-01 DIAGNOSIS — M6281 Muscle weakness (generalized): Secondary | ICD-10-CM | POA: Diagnosis not present

## 2019-09-01 DIAGNOSIS — G8929 Other chronic pain: Secondary | ICD-10-CM | POA: Diagnosis not present

## 2019-09-01 NOTE — Therapy (Signed)
[facility] [address] [place], [place], [address] Phone: [phone]   Fax:  [phone]  Physical Therapy Treatment  Patient Details  Name: Anna [name] MRN: [id number] Date of Birth: [birth year] Referring Provider (PT): [name] Persons, PA-C   Encounter Date: 09/01/2019   PT End of Session - 09/01/19 1255    Visit Number 3    Number of Visits 16    Date for PT Re-Evaluation 10/13/19    Progress Note Due on Visit 10    PT Start Time 1300    PT Stop Time 1340    PT Time Calculation (min) 40 min    Activity Tolerance Patient tolerated treatment well    Behavior During Therapy [facility] for tasks assessed/performed           Past Medical History:  Diagnosis Date  . Breast cancer [facility])    age 74  . Headache   . Vision abnormalities     Past Surgical History:  Procedure Laterality Date  . BACK SURGERY     lumbar region  . BUNIONECTOMY Bilateral   . MASTECTOMY Left    at age 74  . TRIGGER FINGER RELEASE Left    left thumb  . TUBAL LIGATION      There were no vitals filed for this visit.   Subjective Assessment - 09/01/19 1303    Subjective "The symptoms are getting better but still gets some of the RUE referred sypomtoms"    Pertinent History history of ED visit, prednisone but discontinued due to headache.  Fall about 1-2 months ago, RT UE symptoms noted.  Injections in past but not helpful.  Imaging on shoulder in past with rotator cuff tears evident    Currently in Pain? Yes    Pain Score 0-No pain              [facility] PT Assessment - 09/01/19 0001      Assessment   Medical Diagnosis Chronic Rt Shoulder/neck pain    Referring Provider (PT) [name] Persons, PA-C                         [facility] Adult PT Treatment/Exercise - 09/01/19 0001      Self-Care   Self-Care Other Self-Care Comments    Other Self-Care Comments  how to perofrom self trigger point release and tools that can be used at home.         Neck Exercises: Supine   Neck Retraction 10 reps;5 secs   cues/ demonstration for proper form     Shoulder Exercises: Supine   Other Supine Exercises thoracic exension over bolster with hands behind head and elbows adducted 2 x 10      Shoulder Exercises: Standing   Other Standing Exercises scapular retreaction with ER 2 x 15 with green band      Shoulder Exercises: ROM/Strengthening   UBE (Upper Arm Bike) L2 x 5 min    fwd/bwd x 2:30     Shoulder Exercises: Stretch   Other Shoulder Stretches cross arm stretch 3 x 30 sec Rt UE    Other Shoulder Stretches upper trap / levator scap stretch 3 x 30 sec      Manual Therapy   Manual therapy comments MTPR along bil upper trap/ levator scapulae, sub-occipital release                  PT Education - 09/01/19 1341    Education Details  reviewed and updated HEP today. how to perform self lead MTPR and tools that can assist with self treatment.    Person(s) Educated Patient    Methods Explanation;Verbal cues;Handout    Comprehension Verbalized understanding;Verbal cues required            PT Short Term Goals - 08/18/19 1144      PT SHORT TERM GOAL #1   Title Pt. will demonstrate independent use of starter HEP    Status New    Target Date 09/01/19             PT Long Term Goals - 08/18/19 1142      PT LONG TERM GOAL #1   Baseline Patient will demonstrate/report pain at worst less than or equal to 2/10 to facilitate minimal limitation in daily activity secondary to pain symptoms.    Status New    Target Date 10/13/19      PT LONG TERM GOAL #2   Title Patient will demonstrate independent use of home exercise program to facilitate ability to maintain/progress functional gains from skilled physical therapy services.    Status New    Target Date 10/13/19      PT LONG TERM GOAL #3   Title Patient will demonstrate return to recreational activity at previous level of function without limitations secondary due to condition     Status New    Target Date 10/13/19      PT LONG TERM GOAL #4   Title Patient will demonstrate Rt [place] joint mobility WFL to facilitate usual self care, dressing, reaching overhead at PLOF s limitation due to symptoms.    Status New    Target Date 10/13/19      PT LONG TERM GOAL #5   Title Patient will demonstrate Rt UE MMT 5/5 throughout to facilitate usual lifting, carrying in functional activity to PLOF s limitation.    Target Date 10/13/19      Additional Long Term Goals   Additional Long Term Goals Yes      PT LONG TERM GOAL #6   Title Pt. will demonstrate cervical AROM WFL s symptoms to facilitate usual daily mobility at PLOF s limitation.    Status New    Target Date 10/13/19                 Plan - 09/01/19 1340    Clinical Impression Statement pt notes limited improvement since last session. cotninued working on posture with thoracic extension as well as cervical retraction. She responded well with posterior shoulder strengthening reporting no pain during or following session.    PT Next Visit Plan Cervicothoracic mobility improvements, myofascial release, progressive postural strengthening.    PT Home Exercise Plan [email or web address]           Patient will benefit from skilled therapeutic intervention in order to improve the following deficits and impairments:  Decreased endurance, Hypomobility, Decreased activity tolerance, Decreased strength, Impaired UE functional use, Pain, Increased muscle spasms, Decreased mobility, Decreased range of motion, Impaired perceived functional ability, Postural dysfunction, Impaired flexibility  Visit Diagnosis: Chronic right shoulder pain  Cervicalgia  Muscle weakness (generalized)  Abnormal posture     Problem List Patient Active Problem List   Diagnosis Date Noted  . Chronic right shoulder pain 03/07/2018  . Chronic migraine 08/11/2017    Starr [place] PT, DPT, LAT, ATC  09/01/19  1:43 PM      [facility] [address] [place], [place], [address] Phone:  [phone]   Fax:  [phone]  Name: Anna [name] MRN: [id number] Date of Birth: [birth year]

## 2019-09-07 ENCOUNTER — Ambulatory Visit (INDEPENDENT_AMBULATORY_CARE_PROVIDER_SITE_OTHER): Payer: Medicare Other | Admitting: Rehabilitative and Restorative Service Providers"

## 2019-09-07 ENCOUNTER — Other Ambulatory Visit: Payer: Self-pay

## 2019-09-07 ENCOUNTER — Encounter: Payer: Self-pay | Admitting: Rehabilitative and Restorative Service Providers"

## 2019-09-07 DIAGNOSIS — R293 Abnormal posture: Secondary | ICD-10-CM | POA: Diagnosis not present

## 2019-09-07 DIAGNOSIS — M6281 Muscle weakness (generalized): Secondary | ICD-10-CM | POA: Diagnosis not present

## 2019-09-07 DIAGNOSIS — M542 Cervicalgia: Secondary | ICD-10-CM | POA: Diagnosis not present

## 2019-09-07 DIAGNOSIS — M25511 Pain in right shoulder: Secondary | ICD-10-CM | POA: Diagnosis not present

## 2019-09-07 DIAGNOSIS — G8929 Other chronic pain: Secondary | ICD-10-CM | POA: Diagnosis not present

## 2019-09-07 NOTE — Therapy (Signed)
[facility] [address] [place], [place], [address] Phone: [phone]   Fax:  [phone]  Physical Therapy Treatment  Patient Details  Name: Anna [name] MRN: [id number] Date of Birth: 1945-12-04 Referring Provider (PT): [name] Persons, PA-C   Encounter Date: 09/07/2019   PT End of Session - 09/07/19 1415    Visit Number 4    Number of Visits 16    Date for PT Re-Evaluation 10/13/19    Progress Note Due on Visit 10    PT Start Time 2094    PT Stop Time 1430    PT Time Calculation (min) 38 min    Activity Tolerance Patient tolerated treatment well    Behavior During Therapy [facility] for tasks assessed/performed           Past Medical History:  Diagnosis Date  . Breast cancer [facility])    age 66  . Headache   . Vision abnormalities     Past Surgical History:  Procedure Laterality Date  . BACK SURGERY     lumbar region  . BUNIONECTOMY Bilateral   . MASTECTOMY Left    at age 75  . TRIGGER FINGER RELEASE Left    left thumb  . TUBAL LIGATION      There were no vitals filed for this visit.   Subjective Assessment - 09/07/19 1355    Subjective Pt. indicated having spasm on Lt side of neck off and on.  Pt. indicated she is to have MRI performed on neck.    Pertinent History history of ED visit, prednisone but discontinued due to headache.  Fall about 1-2 months ago, RT UE symptoms noted.  Injections in past but not helpful.  Imaging on shoulder in past with rotator cuff tears evident    Currently in Pain? Yes    Pain Score 0-No pain   at worst 3/10   Pain Location Shoulder    Pain Orientation Right    Pain Descriptors / Indicators Aching    Pain Type Chronic pain    Pain Onset More than a month ago    Pain Frequency Occasional    Aggravating Factors  twinge at times for Rt shoulder    Pain Relieving Factors medicine, DN has helped shoulder    Effect of Pain on Daily Activities Neck limiting daily self care, household activity at  times.                             [facility] Adult PT Treatment/Exercise - 09/07/19 0001      Shoulder Exercises: ROM/Strengthening   UBE (Upper Arm Bike) Lvl 2.5 2.5 mins fwd/back each way      Shoulder Exercises: Stretch   Other Shoulder Stretches upper trap / levator scap stretch 3 x 30 sec      Modalities   Modalities Moist Heat;Electrical Stimulation      Moist Heat Therapy   Number Minutes Moist Heat 10 Minutes    Moist Heat Location Cervical      Electrical Stimulation   Electrical Stimulation Location Lt upper trap    Electrical Stimulation Action pre mod    Electrical Stimulation Parameters to tolerance 10 mins c heat    Electrical Stimulation Goals Pain      Manual Therapy   Manual therapy comments TrP release, compression Lt upper trap            Trigger Point Dry Needling - 09/07/19 0001  Consent Given? Yes    Muscles Treated Head and Neck Upper trapezius   Lt   Upper Trapezius Response Twitch reponse elicited                  PT Short Term Goals - 09/07/19 1417      PT SHORT TERM GOAL #1   Title Pt. will demonstrate independent use of starter HEP    Status Achieved    Target Date 09/01/19             PT Long Term Goals - 09/07/19 1416      PT LONG TERM GOAL #1   Title Patient will demonstrate/report pain at worst less than or equal to 2/10 to facilitate minimal limitation in daily activity secondary to pain symptoms.    Status On-going    Target Date 10/13/19      PT LONG TERM GOAL #2   Title Patient will demonstrate independent use of home exercise program to facilitate ability to maintain/progress functional gains from skilled physical therapy services.    Status On-going    Target Date 10/13/19      PT LONG TERM GOAL #3   Title Patient will demonstrate return to recreational activity at previous level of function without limitations secondary due to condition    Status On-going    Target Date 10/13/19      PT  LONG TERM GOAL #4   Title Patient will demonstrate Rt [place] joint mobility WFL to facilitate usual self care, dressing, reaching overhead at PLOF s limitation due to symptoms.    Status On-going    Target Date 10/13/19      PT LONG TERM GOAL #5   Title Patient will demonstrate Rt UE MMT 5/5 throughout to facilitate usual lifting, carrying in functional activity to PLOF s limitation.    Status On-going    Target Date 10/13/19      PT LONG TERM GOAL #6   Title Pt. will demonstrate cervical AROM WFL s symptoms to facilitate usual daily mobility at PLOF s limitation.    Status On-going    Target Date 10/13/19                 Plan - 09/07/19 1404    Clinical Impression Statement Pt. has current presentation centered more on disability and limitations to cervical symptoms vs. original referral for Rt shoulder pain.  Progress noted well on Rt shoulder complaints to this point.    Personal Factors and Comorbidities Other    Examination-Activity Limitations Carry;Lift;Hygiene/Grooming;Sleep;Reach Overhead    Examination-Participation Restrictions Other    Stability/Clinical Decision Making Stable/Uncomplicated    Clinical Decision Making Low    PT Duration 8 weeks    PT Treatment/Interventions ADLs/Self Care Home Management;Cryotherapy;Electrical Stimulation;Iontophoresis 4mg /ml Dexamethasone;Moist Heat;Traction;Therapeutic exercise;Therapeutic activities;Functional mobility training;Ultrasound;Neuromuscular re-education;Patient/family education;Manual techniques;Vasopneumatic Device;Taping;Dry needling;Passive range of motion;Spinal Manipulations;Joint Manipulations    PT Next Visit Plan Continued treatment for cervical related myofascial pain, possible traction/distraction if preferred?    PT Home Exercise Plan [email or web address]    Consulted and Agree with Plan of Care Patient           Patient will benefit from skilled therapeutic intervention in order to improve the following deficits and  impairments:  Decreased endurance, Hypomobility, Decreased activity tolerance, Decreased strength, Impaired UE functional use, Pain, Increased muscle spasms, Decreased mobility, Decreased range of motion, Impaired perceived functional ability, Postural dysfunction, Impaired flexibility  Visit Diagnosis: Chronic right shoulder pain  Cervicalgia  Muscle weakness (generalized)  Abnormal posture     Problem List Patient Active Problem List   Diagnosis Date Noted  . Chronic right shoulder pain 03/07/2018  . Chronic migraine 08/11/2017    Scot Jun, PT, DPT, OCS, ATC 09/07/19  2:22 PM    [facility] [address] [place], [place], [address] Phone: [phone]   Fax:  [phone]  Name: Anna [name] MRN: [id number] Date of Birth: [birth year]

## 2019-09-08 ENCOUNTER — Encounter: Payer: Medicare Other | Admitting: Rehabilitative and Restorative Service Providers"

## 2019-09-10 ENCOUNTER — Other Ambulatory Visit: Payer: Self-pay

## 2019-09-10 ENCOUNTER — Ambulatory Visit
Admission: RE | Admit: 2019-09-10 | Discharge: 2019-09-10 | Disposition: A | Discharge status: Home / Self Care | Payer: Medicare Other | Source: Ambulatory Visit | Attending: Family | Admitting: Family

## 2019-09-10 DIAGNOSIS — M542 Cervicalgia: Secondary | ICD-10-CM

## 2019-09-10 DIAGNOSIS — M4802 Spinal stenosis, cervical region: Secondary | ICD-10-CM | POA: Diagnosis not present

## 2019-09-11 ENCOUNTER — Encounter: Payer: Self-pay | Admitting: Rehabilitative and Restorative Service Providers"

## 2019-09-11 ENCOUNTER — Ambulatory Visit: Payer: Medicare Other | Admitting: Rehabilitative and Restorative Service Providers"

## 2019-09-11 DIAGNOSIS — M542 Cervicalgia: Secondary | ICD-10-CM | POA: Diagnosis not present

## 2019-09-11 DIAGNOSIS — M6281 Muscle weakness (generalized): Secondary | ICD-10-CM

## 2019-09-11 DIAGNOSIS — M25511 Pain in right shoulder: Secondary | ICD-10-CM | POA: Diagnosis not present

## 2019-09-11 DIAGNOSIS — G8929 Other chronic pain: Secondary | ICD-10-CM

## 2019-09-11 DIAGNOSIS — R293 Abnormal posture: Secondary | ICD-10-CM | POA: Diagnosis not present

## 2019-09-11 NOTE — Therapy (Signed)
[facility] [address] [place], [place], [address] Phone: [phone]   Fax:  [phone]  Physical Therapy Treatment  Patient Details  Name: SURABHI GADEA MRN: [id number] Date of Birth: 1945-05-11 Referring Provider (PT): [name] Persons, PA-C   Encounter Date: 09/11/2019   PT End of Session - 09/11/19 6222    Visit Number 5    Number of Visits 16    Date for PT Re-Evaluation 10/13/19    Progress Note Due on Visit 10    PT Start Time 0932    PT Stop Time 1011    PT Time Calculation (min) 39 min    Activity Tolerance Patient tolerated treatment well    Behavior During Therapy [facility] for tasks assessed/performed           Past Medical History:  Diagnosis Date   Breast cancer [facility])    age 24   Headache    Vision abnormalities     Past Surgical History:  Procedure Laterality Date   BACK SURGERY     lumbar region   BUNIONECTOMY Bilateral    MASTECTOMY Left    at age 79   TRIGGER FINGER RELEASE Left    left thumb   TUBAL LIGATION      There were no vitals filed for this visit.   Subjective Assessment - 09/11/19 0935    Subjective Pt. indicated feeling sore after last visit but didn't feel the spasms as much.  Pt. stated occasional Rt side    Pertinent History history of ED visit, prednisone but discontinued due to headache.  Fall about 1-2 months ago, RT UE symptoms noted.  Injections in past but not helpful.  Imaging on shoulder in past with rotator cuff tears evident    Currently in Pain? Yes    Pain Score 1     Pain Location Neck    Pain Orientation Left    Pain Descriptors / Indicators Tightness;Sore    Pain Type Chronic pain    Pain Onset More than a month ago    Pain Frequency Occasional    Aggravating Factors  insidious at times                             [facility] Adult PT Treatment/Exercise - 09/11/19 0001      Shoulder Exercises: Standing   External Rotation Strengthening;Both;Other  (comment)   3 x 10   Theraband Level (Shoulder External Rotation) Level 2 (Red)    Extension Both;Strengthening;Other (comment)   3 x 10   Theraband Level (Shoulder Extension) Level 3 (Green)    Row Both   3 x 10   Theraband Level (Shoulder Row) Level 3 (Green)      Shoulder Exercises: ROM/Strengthening   UBE (Upper Arm Bike) Lvl 2 3 min fwd/back                    PT Short Term Goals - 09/07/19 1417      PT SHORT TERM GOAL #1   Title Pt. will demonstrate independent use of starter HEP    Status Achieved    Target Date 09/01/19             PT Long Term Goals - 09/07/19 1416      PT LONG TERM GOAL #1   Title Patient will demonstrate/report pain at worst less than or equal to 2/10 to facilitate minimal limitation in daily activity secondary  to pain symptoms.    Status On-going    Target Date 10/13/19      PT LONG TERM GOAL #2   Title Patient will demonstrate independent use of home exercise program to facilitate ability to maintain/progress functional gains from skilled physical therapy services.    Status On-going    Target Date 10/13/19      PT LONG TERM GOAL #3   Title Patient will demonstrate return to recreational activity at previous level of function without limitations secondary due to condition    Status On-going    Target Date 10/13/19      PT LONG TERM GOAL #4   Title Patient will demonstrate Rt [place] joint mobility WFL to facilitate usual self care, dressing, reaching overhead at PLOF s limitation due to symptoms.    Status On-going    Target Date 10/13/19      PT LONG TERM GOAL #5   Title Patient will demonstrate Rt UE MMT 5/5 throughout to facilitate usual lifting, carrying in functional activity to PLOF s limitation.    Status On-going    Target Date 10/13/19      PT LONG TERM GOAL #6   Title Pt. will demonstrate cervical AROM WFL s symptoms to facilitate usual daily mobility at PLOF s limitation.    Status On-going    Target Date 10/13/19                  Plan - 09/11/19 0949    Clinical Impression Statement Overall progression noted in reduced severity of symptoms in neck and shoulder, more mild to moderate occasional symptoms noted.   Continued skilled PT services indicated to progression of activity tolerance.    Personal Factors and Comorbidities Other    Examination-Activity Limitations Carry;Lift;Hygiene/Grooming;Sleep;Reach Overhead    Examination-Participation Restrictions Other    Stability/Clinical Decision Making Stable/Uncomplicated    PT Duration 8 weeks    PT Treatment/Interventions ADLs/Self Care Home Management;Cryotherapy;Electrical Stimulation;Iontophoresis 4mg /ml Dexamethasone;Moist Heat;Traction;Therapeutic exercise;Therapeutic activities;Functional mobility training;Ultrasound;Neuromuscular re-education;Patient/family education;Manual techniques;Vasopneumatic Device;Taping;Dry needling;Passive range of motion;Spinal Manipulations;Joint Manipulations    PT Next Visit Plan DN prn for symptom reduction, activity tolerance progression through ther ex.    PT Home Exercise Plan [email or web address]    Consulted and Agree with Plan of Care Patient           Patient will benefit from skilled therapeutic intervention in order to improve the following deficits and impairments:  Decreased endurance, Hypomobility, Decreased activity tolerance, Decreased strength, Impaired UE functional use, Pain, Increased muscle spasms, Decreased mobility, Decreased range of motion, Impaired perceived functional ability, Postural dysfunction, Impaired flexibility  Visit Diagnosis: Chronic right shoulder pain  Cervicalgia  Muscle weakness (generalized)  Abnormal posture     Problem List Patient Active Problem List   Diagnosis Date Noted   Chronic right shoulder pain 03/07/2018   Chronic migraine 08/11/2017    Scot Jun, PT, DPT, OCS, ATC 09/11/19  10:05 AM    [facility] [address] [place], [place], [address] Phone: [phone]   Fax:  [phone]  Name: ZHANA JEANGILLES MRN: [id number] Date of Birth: May 25, 1945

## 2019-09-12 ENCOUNTER — Other Ambulatory Visit: Payer: Self-pay

## 2019-09-12 ENCOUNTER — Encounter: Payer: Self-pay | Admitting: Orthopaedic Surgery

## 2019-09-12 ENCOUNTER — Ambulatory Visit: Payer: Medicare Other | Admitting: Orthopaedic Surgery

## 2019-09-12 ENCOUNTER — Ambulatory Visit: Payer: Self-pay

## 2019-09-12 DIAGNOSIS — M1611 Unilateral primary osteoarthritis, right hip: Secondary | ICD-10-CM | POA: Diagnosis not present

## 2019-09-12 DIAGNOSIS — M5010 Cervical disc disorder with radiculopathy, unspecified cervical region: Secondary | ICD-10-CM

## 2019-09-12 DIAGNOSIS — M1711 Unilateral primary osteoarthritis, right knee: Secondary | ICD-10-CM

## 2019-09-12 NOTE — Progress Notes (Signed)
Office Visit Note   Patient: DEMECIA [name]           Date of Birth: 11/26/1945           MRN: [id number] Visit Date: 09/12/2019              Requested by: [name], MD [address] [place],  [place] [address] PCP: [name], MD   Assessment & Plan: Visit Diagnoses:  1. Primary osteoarthritis of right knee   2. Primary osteoarthritis of right hip   3. Cervical disc disorder with radiculopathy, unspecified cervical region     Plan: Given her MRI findings of her cervical spine and continued radicular symptoms down the right arm we will refer her to Dr. [name] for cervical ESI.  In regards to the right hip she does not wish to undergo any type of surgical intervention at this point in time.  Therefore she wants to repeat the intra-articular injection of the right hip.  She will follow-up with [place] in 4 weeks see what type of response she had to the injection in the right hip.  Discussed with her hopefully this will help differentiate her hip pain from her knee pain.  Follow-Up Instructions: Return in about 4 weeks (around 10/10/2019).   Orders:  Orders Placed This Encounter  Procedures  . XR HIP UNILAT W OR W/O PELVIS 2-3 VIEWS RIGHT  . XR Knee 1-2 Views Right   No orders of the defined types were placed in this encounter.     Procedures: No procedures performed   Clinical Data: No additional findings.   Subjective: Chief Complaint  Patient presents with  . Right Knee - Pain    HPI Anna [name] returns today complaining of pain in the back of her right knee.  She is also complaining of right hip pain.  She also wants to discuss the results of her C-spine MRI.  She states she continues to have radicular symptoms down the right arm sometimes into the hand.  Occasionally some pain down the left arm but not as frequent as the right. Regards to her knee pain most the pain is in the posterior aspect of the knee.  She states knee feels stiff.  She did undergo  supplemental injection right knee on 06/28/2019 is unsure how much that helped it might help some.  However if she talks about her knee pain and then then states that she has difficulty bringing her foot up to put on lotion on her foot or to get in and out of the vehicle hard to get to the him of her pants.  She states that she has pain from the knee that radiates up into her hip region both groin and into the buttocks region.  She said no new injury to right knee or right hip. MRI cervical spine dated 09/10/2019 is reviewed with the patient.  C3-C4 mild spinal and mild to moderate right C4 foraminal stenosis.  C4-C5 moderate to severe left and mild to moderate right C5 foraminal stenosis.  C5-C6 moderate to severe bilateral C6 foraminal stenosis.  C6-C7 mild to moderate bilateral C7 foraminal stenosis mild spinal stenosis no mass-effect upon spinal cord.  C7-T1 moderate left and severe right C8 foraminal stenosis.  Review of Systems Negative for fevers chills.  See HPI otherwise negative or noncontributory.  Objective: Vital Signs: There were no vitals taken for this visit.  Physical Exam General: Well-developed well-nourished female no acute distress Psych: Alert and oriented  x3 Ortho Exam Bilateral hips left hip good range of motion without pain.  Right hip pain with internal rotation.  She ambulates with a slight antalgic gait with the use of cane.  Right knee slight effusion.  No abnormal warmth erythema.  No instability valgus varus stressing.  Tenderness along medial joint line. Specialty Comments:  No specialty comments available.  Imaging: XR HIP UNILAT W OR W/O PELVIS 2-3 VIEWS RIGHT  Result Date: 09/12/2019 AP pelvis lateral view of the right hip: No acute fractures.  Bilateral hips well located.  Advancement of joint space narrowing right hip compared to 2019.  Near end-stage arthritis of the right hip.  Left hip well-preserved.  XR Knee 1-2 Views Right  Result Date: 09/12/2019 Right  knee AP lateral views: No acute fracture.  Knee is well located.  Moderate narrowing of the medial joint line and patellofemoral joint.  No bony abnormalities otherwise.    PMFS History: Patient Active Problem List   Diagnosis Date Noted  . Chronic right shoulder pain 03/07/2018  . Chronic migraine 08/11/2017   Past Medical History:  Diagnosis Date  . Breast cancer [facility])    age 17  . Headache   . Vision abnormalities     Family History  Problem Relation Age of Onset  . Hyperlipidemia Mother   . Hypertension Mother   . Alzheimer's disease Father   . Hypertension Sister   . Hypertension Sister   . Hypertension Sister   . Hypertension Brother   . Lung cancer Brother   . Hypertension Sister   . Hypertension Brother     Past Surgical History:  Procedure Laterality Date  . BACK SURGERY     lumbar region  . BUNIONECTOMY Bilateral   . MASTECTOMY Left    at age 74  . TRIGGER FINGER RELEASE Left    left thumb  . TUBAL LIGATION     Social History   Occupational History  . Not on file  Tobacco Use  . Smoking status: Never Smoker  . Smokeless tobacco: Never Used  Substance and Sexual Activity  . Alcohol use: Never  . Drug use: Never  . Sexual activity: Not Currently

## 2019-09-13 ENCOUNTER — Encounter: Payer: Medicare Other | Admitting: Rehabilitative and Restorative Service Providers"

## 2019-09-13 ENCOUNTER — Other Ambulatory Visit: Payer: Self-pay | Admitting: Radiology

## 2019-09-13 DIAGNOSIS — M25551 Pain in right hip: Secondary | ICD-10-CM

## 2019-09-13 DIAGNOSIS — M542 Cervicalgia: Secondary | ICD-10-CM

## 2019-09-15 ENCOUNTER — Telehealth: Payer: Self-pay | Admitting: Physical Medicine and Rehabilitation

## 2019-09-15 NOTE — Telephone Encounter (Signed)
Patient called requesting a call back from [place]. Patient states she is returning call about an appt set up. Please call patient at [phone].

## 2019-09-18 ENCOUNTER — Encounter: Payer: Self-pay | Admitting: Rehabilitative and Restorative Service Providers"

## 2019-09-18 ENCOUNTER — Telehealth: Payer: Self-pay

## 2019-09-18 ENCOUNTER — Ambulatory Visit: Payer: Medicare Other | Admitting: Rehabilitative and Restorative Service Providers"

## 2019-09-18 ENCOUNTER — Other Ambulatory Visit: Payer: Self-pay

## 2019-09-18 ENCOUNTER — Encounter: Payer: Medicare Other | Admitting: Rehabilitative and Restorative Service Providers"

## 2019-09-18 DIAGNOSIS — M542 Cervicalgia: Secondary | ICD-10-CM | POA: Diagnosis not present

## 2019-09-18 DIAGNOSIS — G8929 Other chronic pain: Secondary | ICD-10-CM | POA: Diagnosis not present

## 2019-09-18 DIAGNOSIS — R293 Abnormal posture: Secondary | ICD-10-CM

## 2019-09-18 DIAGNOSIS — M25511 Pain in right shoulder: Secondary | ICD-10-CM

## 2019-09-18 DIAGNOSIS — M6281 Muscle weakness (generalized): Secondary | ICD-10-CM

## 2019-09-18 NOTE — Telephone Encounter (Signed)
Documented in referral  

## 2019-09-18 NOTE — Therapy (Addendum)
[facility] Physical Therapy [address] [place], [place], [address] Phone: [phone]   Fax:  [phone]  Physical Therapy Treatment/Discharge  Patient Details  Name: Anna [name] MRN: [id number] Date of Birth: 11-08-1945 Referring Provider (PT): [name] Persons, PA-C   Encounter Date: 09/18/2019   PT End of Session - 09/18/19 1347    Visit Number 6    Number of Visits 16    Date for PT Re-Evaluation 10/13/19    Progress Note Due on Visit 10    PT Start Time 0737    PT Stop Time 1423    PT Time Calculation (min) 38 min    Activity Tolerance Patient tolerated treatment well    Behavior During Therapy [facility] for tasks assessed/performed           Past Medical History:  Diagnosis Date  . Breast cancer [facility])    age 5  . Headache   . Vision abnormalities     Past Surgical History:  Procedure Laterality Date  . BACK SURGERY     lumbar region  . BUNIONECTOMY Bilateral   . MASTECTOMY Left    at age 36  . TRIGGER FINGER RELEASE Left    left thumb  . TUBAL LIGATION      There were no vitals filed for this visit.   Subjective Assessment - 09/18/19 1352    Subjective Pt. reported feeling good improvement overall since last visit for neck complaints.  Pt. stated she has appointments for Dr. [name] regarding hip/neck injections    Pertinent History history of ED visit, prednisone but discontinued due to headache.  Fall about 1-2 months ago, RT UE symptoms noted.  Injections in past but not helpful.  Imaging on shoulder in past with rotator cuff tears evident    Currently in Pain? No/denies    Pain Score 0-No pain    Pain Location Neck    Pain Onset More than a month ago    Pain Frequency Occasional                             [facility] Adult PT Treatment/Exercise - 09/18/19 0001      Neck Exercises: Supine   Other Supine Exercise UC flexion 5 sec x 15, supine cervical rotation x 15      Shoulder Exercises: Supine    Horizontal ABduction Strengthening;Both   3 x 10   Theraband Level (Shoulder Horizontal ABduction) Level 3 (Green)      Shoulder Exercises: Seated   Other Seated Exercises scapular retraction 5 sec hold x 10      Shoulder Exercises: Standing   External Rotation Strengthening;Both;Other (comment)    Theraband Level (Shoulder External Rotation) Level 3 (Green)   3 x 10    Internal Rotation Both;Strengthening   3 x 10   Theraband Level (Shoulder Internal Rotation) Level 3 (Green)    Extension Both;Strengthening;Other (comment)   3 x 10   Theraband Level (Shoulder Extension) Level 3 (Green)    Row Both   3 x 10   Theraband Level (Shoulder Row) Level 3 (Green)      Shoulder Exercises: ROM/Strengthening   UBE (Upper Arm Bike) lvl 2.5 5 mins fwd/back each way                    PT Short Term Goals - 09/07/19 1417      PT SHORT TERM GOAL #1   Title Pt.  will demonstrate independent use of starter HEP    Status Achieved    Target Date 09/01/19             PT Long Term Goals - 09/07/19 1416      PT LONG TERM GOAL #1   Title Patient will demonstrate/report pain at worst less than or equal to 2/10 to facilitate minimal limitation in daily activity secondary to pain symptoms.    Status On-going    Target Date 10/13/19      PT LONG TERM GOAL #2   Title Patient will demonstrate independent use of home exercise program to facilitate ability to maintain/progress functional gains from skilled physical therapy services.    Status On-going    Target Date 10/13/19      PT LONG TERM GOAL #3   Title Patient will demonstrate return to recreational activity at previous level of function without limitations secondary due to condition    Status On-going    Target Date 10/13/19      PT LONG TERM GOAL #4   Title Patient will demonstrate Rt [place] joint mobility WFL to facilitate usual self care, dressing, reaching overhead at PLOF s limitation due to symptoms.    Status On-going     Target Date 10/13/19      PT LONG TERM GOAL #5   Title Patient will demonstrate Rt UE MMT 5/5 throughout to facilitate usual lifting, carrying in functional activity to PLOF s limitation.    Status On-going    Target Date 10/13/19      PT LONG TERM GOAL #6   Title Pt. will demonstrate cervical AROM WFL s symptoms to facilitate usual daily mobility at PLOF s limitation.    Status On-going    Target Date 10/13/19                 Plan - 09/18/19 1415    Clinical Impression Statement Reduced cervical complaints today and in last week of good noted improvement at this time, resulting in deferral for manual intervention.  Graded activity increase c ther ex performed today c good overall tolerance.    Personal Factors and Comorbidities Other    Examination-Activity Limitations Carry;Lift;Hygiene/Grooming;Sleep;Reach Overhead    Examination-Participation Restrictions Other    Stability/Clinical Decision Making Stable/Uncomplicated    PT Duration 8 weeks    PT Treatment/Interventions ADLs/Self Care Home Management;Cryotherapy;Electrical Stimulation;Iontophoresis 62m/ml Dexamethasone;Moist Heat;Traction;Therapeutic exercise;Therapeutic activities;Functional mobility training;Ultrasound;Neuromuscular re-education;Patient/family education;Manual techniques;Vasopneumatic Device;Taping;Dry needling;Passive range of motion;Spinal Manipulations;Joint Manipulations    PT Next Visit Plan DN prn for symptom reduction, activity tolerance progression through ther ex.    PT Home Exercise Plan [id number]    Consulted and Agree with Plan of Care Patient           Patient will benefit from skilled therapeutic intervention in order to improve the following deficits and impairments:  Decreased endurance, Hypomobility, Decreased activity tolerance, Decreased strength, Impaired UE functional use, Pain, Increased muscle spasms, Decreased mobility, Decreased range of motion, Impaired perceived functional  ability, Postural dysfunction, Impaired flexibility  Visit Diagnosis: Chronic right shoulder pain  Cervicalgia  Muscle weakness (generalized)  Abnormal posture     Problem List Patient Active Problem List   Diagnosis Date Noted  . Chronic right shoulder pain 03/07/2018  . Chronic migraine 08/11/2017    MScot Jun PT, DPT, OCS, ATC 09/18/19  2:21 PM  PHYSICAL THERAPY DISCHARGE SUMMARY  Visits from Start of Care: 6  Current functional level related to goals / functional outcomes: See  note   Remaining deficits: See note   Education / Equipment: HEP Plan: Patient agrees to discharge.  Patient goals were partially met. Patient is being discharged due to not returning since the last visit.  ?????    Scot Jun, PT, DPT, OCS, ATC 11/21/19  10:41 AM     [facility] Physical Therapy [address] [place], [place], [address] Phone: [phone]   Fax:  [phone]  Name: Anna [name] MRN: [id number] Date of Birth: 09-11-1945

## 2019-09-18 NOTE — Telephone Encounter (Signed)
Patient is here for PT appt and was inquiring about missed call wanted to know if you could see her while she is here .

## 2019-09-20 ENCOUNTER — Encounter: Payer: Medicare Other | Admitting: Rehabilitative and Restorative Service Providers"

## 2019-10-04 ENCOUNTER — Ambulatory Visit: Payer: Self-pay

## 2019-10-04 ENCOUNTER — Other Ambulatory Visit: Payer: Self-pay

## 2019-10-04 ENCOUNTER — Ambulatory Visit: Payer: Medicare Other | Admitting: Physical Medicine and Rehabilitation

## 2019-10-04 ENCOUNTER — Encounter: Payer: Self-pay | Admitting: Physical Medicine and Rehabilitation

## 2019-10-04 VITALS — BP 102/66 | HR 81

## 2019-10-04 DIAGNOSIS — M47812 Spondylosis without myelopathy or radiculopathy, cervical region: Secondary | ICD-10-CM | POA: Diagnosis not present

## 2019-10-04 DIAGNOSIS — M542 Cervicalgia: Secondary | ICD-10-CM

## 2019-10-04 DIAGNOSIS — M1611 Unilateral primary osteoarthritis, right hip: Secondary | ICD-10-CM

## 2019-10-04 DIAGNOSIS — M25551 Pain in right hip: Secondary | ICD-10-CM | POA: Diagnosis not present

## 2019-10-04 NOTE — Progress Notes (Signed)
1. Right hip pain. Groin pain and posterior pain. Pain worse when going from sit to stand, first few steps when walking, getting in and out of the car. 2. Left sided neck pain. No arm pain. First started having neck "spasms" after a fall in 2018. Numeric Pain Rating Scale and Functional Assessment Average Pain 7   In the last MONTH (on 0-10 scale) has pain interfered with the following?  1. General activity like being  able to carry out your everyday physical activities such as walking, climbing stairs, carrying groceries, or moving a chair?  Rating(1)    -Dye Allergies.

## 2019-10-17 ENCOUNTER — Ambulatory Visit: Payer: Medicare Other | Admitting: Orthopaedic Surgery

## 2019-10-17 ENCOUNTER — Encounter: Payer: Self-pay | Admitting: Orthopaedic Surgery

## 2019-10-17 DIAGNOSIS — M25551 Pain in right hip: Secondary | ICD-10-CM

## 2019-10-17 DIAGNOSIS — M1611 Unilateral primary osteoarthritis, right hip: Secondary | ICD-10-CM

## 2019-10-17 NOTE — Progress Notes (Signed)
The patient returns for follow-up after having an intra-articular steroid injection in her right hip.  She states that the injection has worked very well and she is very happy thus far.  She says she has improved mobility with going up and down stairs as well.  She does have significant arthritis in her right hip.  She also has been dealing with significant arthritis and impingement of nerves in her cervical spine.  She says some days are better than others.  She had considered an injection in her neck but right now she is holding off on that.  She also will need to look at her left shoulder.  She does report pain at the top of her left shoulder occasionally.  On examination of her left shoulder, there is some mildly positive Neer and Hawkins signs with pain over the just the [facility] joint.  The left shoulder is well located and moves smoothly and fluidly with an intact rotator cuff.  Examination of her right hip shows stiffness with internal and external rotation.  Her left hip moves smoothly.  At this point follow-up will be as needed.  She is not interested in a surgical intervention since so far she is doing well.  I agree with this.  She will continue use a cane to offload her hip and if things worsens at all she knows to give [place] a call.  She also knows that we would not recommend a steroid injection in the right hip for another 5 to 6 months.

## 2019-11-01 DIAGNOSIS — Z0000 Encounter for general adult medical examination without abnormal findings: Secondary | ICD-10-CM | POA: Diagnosis not present

## 2019-11-01 DIAGNOSIS — E7800 Pure hypercholesterolemia, unspecified: Secondary | ICD-10-CM | POA: Diagnosis not present

## 2019-11-01 DIAGNOSIS — E039 Hypothyroidism, unspecified: Secondary | ICD-10-CM | POA: Diagnosis not present

## 2019-11-01 DIAGNOSIS — E034 Atrophy of thyroid (acquired): Secondary | ICD-10-CM | POA: Diagnosis not present

## 2019-11-01 DIAGNOSIS — E789 Disorder of lipoprotein metabolism, unspecified: Secondary | ICD-10-CM | POA: Diagnosis not present

## 2019-11-01 DIAGNOSIS — E559 Vitamin D deficiency, unspecified: Secondary | ICD-10-CM | POA: Diagnosis not present

## 2019-11-06 DIAGNOSIS — D509 Iron deficiency anemia, unspecified: Secondary | ICD-10-CM | POA: Diagnosis not present

## 2019-11-06 DIAGNOSIS — Z0000 Encounter for general adult medical examination without abnormal findings: Secondary | ICD-10-CM | POA: Diagnosis not present

## 2019-11-06 DIAGNOSIS — Z23 Encounter for immunization: Secondary | ICD-10-CM | POA: Diagnosis not present

## 2019-11-06 DIAGNOSIS — E559 Vitamin D deficiency, unspecified: Secondary | ICD-10-CM | POA: Diagnosis not present

## 2019-11-06 DIAGNOSIS — E039 Hypothyroidism, unspecified: Secondary | ICD-10-CM | POA: Diagnosis not present

## 2019-11-06 DIAGNOSIS — E7800 Pure hypercholesterolemia, unspecified: Secondary | ICD-10-CM | POA: Diagnosis not present

## 2019-11-08 ENCOUNTER — Other Ambulatory Visit: Payer: Self-pay | Admitting: Internal Medicine

## 2019-11-08 DIAGNOSIS — Z1231 Encounter for screening mammogram for malignant neoplasm of breast: Secondary | ICD-10-CM

## 2019-11-17 ENCOUNTER — Ambulatory Visit: Payer: Self-pay

## 2019-11-17 ENCOUNTER — Encounter: Payer: Self-pay | Admitting: Family

## 2019-11-17 ENCOUNTER — Other Ambulatory Visit: Payer: Self-pay

## 2019-11-17 ENCOUNTER — Ambulatory Visit (INDEPENDENT_AMBULATORY_CARE_PROVIDER_SITE_OTHER): Payer: Medicare Other | Admitting: Family

## 2019-11-17 VITALS — Ht 66.0 in | Wt 208.0 lb

## 2019-11-17 DIAGNOSIS — G8929 Other chronic pain: Secondary | ICD-10-CM

## 2019-11-17 DIAGNOSIS — M25512 Pain in left shoulder: Secondary | ICD-10-CM | POA: Diagnosis not present

## 2019-11-17 NOTE — Progress Notes (Signed)
Office Visit Note   Patient: Anna [name]           Date of Birth: Jun 19, 1945           MRN: [id number] Visit Date: 11/17/2019              Requested by: [name], MD [address] [place],  [place] [address] PCP: [name], MD  Chief Complaint  Patient presents with  . Left Shoulder - Pain      HPI: The patient is a 74 year old woman who presents today complaining of left shoulder pain.  She has had arthroscopy of the same shoulder this was in 2018.  She states she has some pain chronically but had acute worsening of her pain yesterday with extreme pain especially with above head reaching.  Aching pain in the front of her shoulder this radiates down her upper left arm this is not associated with a specific injury or event she feels that she has had some swelling over her clavicle some pain over the upper trapezius as well.  She is feeling like she is stiff and has loss of range of motion  Assessment & Plan: Visit Diagnoses:  1. Chronic left shoulder pain     Plan: Discussed using a muscle relaxer as well as heat for the muscular pain of her upper trapezius.  Will trial a Depo-Medrol injection for her impingement symptoms.  Follow-Up Instructions: No follow-ups on file.   Back Exam   Tenderness  The patient is experiencing no tenderness.   Comments:  Neg spurling, full fom of neck   Right Hand Exam   Muscle Strength  Grip: 5/5    Left Hand Exam   Muscle Strength  Grip:  5/5    Left Shoulder Exam   Tenderness  The patient is experiencing tenderness in the biceps tendon.  Range of Motion  The patient has normal left shoulder ROM.  Muscle Strength  The patient has normal left shoulder strength.  Tests  Impingement: positive Drop arm: negative  Other  Erythema: absent Sensation: normal   Comments:  Upper trapezius and paraspinals on left      Patient is alert, oriented, no adenopathy, well-dressed, normal affect, normal  respiratory effort.   Imaging: No results found. No images are attached to the encounter.  Labs: Lab Results  Component Value Date   ESRSEDRATE 7 08/11/2017   CRP <1 08/11/2017     Lab Results  Component Value Date   ALBUMIN 4.2 08/11/2017    No results found for: MG No results found for: VD25OH  No results found for: PREALBUMIN CBC EXTENDED Latest Ref Rng & Units 08/01/2019 08/11/2017  WBC 4.0 - 10.5 K/uL 7.4 4.7  RBC 3.87 - 5.11 MIL/uL 4.08 3.93  HGB 12.0 - 15.0 g/dL 12.6 11.9  HCT 36 - 46 % 38.6 37.4  PLT 150 - 400 K/uL 226 215     Body mass index is 33.57 kg/m.  Orders:  Orders Placed This Encounter  Procedures  . XR Shoulder Left   No orders of the defined types were placed in this encounter.    Procedures: Large Joint Inj: L subacromial bursa on 11/17/2019 10:02 AM Indications: diagnostic evaluation and pain Details: 22 G 1.5 in needle  Arthrogram: No  Medications: 5 mL lidocaine 1 %; 40 mg methylPREDNISolone acetate 40 MG/ML Outcome: tolerated well, no immediate complications Procedure, treatment alternatives, risks and benefits explained, specific risks discussed. Consent was given by the patient.  Immediately prior to procedure a time out was called to verify the correct patient, procedure, equipment, support staff and site/side marked as required. Patient was prepped and draped in the usual sterile fashion.      Clinical Data: No additional findings.  ROS:  All other systems negative, except as noted in the HPI. Review of Systems  Constitutional: Negative for chills and fever.  Musculoskeletal: Positive for arthralgias, myalgias and neck pain.    Objective: Vital Signs: Ht 5\' 6"  (1.676 m)   Wt 208 lb (94.3 kg)   BMI 33.57 kg/m   Specialty Comments:  No specialty comments available.  PMFS History: Patient Active Problem List   Diagnosis Date Noted  . Chronic right shoulder pain 03/07/2018  . Chronic migraine 08/11/2017   Past  Medical History:  Diagnosis Date  . Breast cancer [facility])    age 60  . Headache   . Vision abnormalities     Family History  Problem Relation Age of Onset  . Hyperlipidemia Mother   . Hypertension Mother   . Alzheimer's disease Father   . Hypertension Sister   . Hypertension Sister   . Hypertension Sister   . Hypertension Brother   . Lung cancer Brother   . Hypertension Sister   . Hypertension Brother     Past Surgical History:  Procedure Laterality Date  . BACK SURGERY     lumbar region  . BUNIONECTOMY Bilateral   . MASTECTOMY Left    at age 30  . TRIGGER FINGER RELEASE Left    left thumb  . TUBAL LIGATION     Social History   Occupational History  . Not on file  Tobacco Use  . Smoking status: Never Smoker  . Smokeless tobacco: Never Used  Substance and Sexual Activity  . Alcohol use: Never  . Drug use: Never  . Sexual activity: Not Currently

## 2019-11-21 MED ORDER — METHYLPREDNISOLONE ACETATE 40 MG/ML IJ SUSP
40.0000 mg | INTRAMUSCULAR | Status: AC | PRN
  Administered 2019-11-17: 40 mg via INTRA_ARTICULAR

## 2019-11-21 MED ORDER — LIDOCAINE HCL 1 % IJ SOLN
5.0000 mL | INTRAMUSCULAR | Status: AC | PRN
  Administered 2019-11-17: 5 mL

## 2019-11-22 DIAGNOSIS — N644 Mastodynia: Secondary | ICD-10-CM | POA: Diagnosis not present

## 2019-11-24 ENCOUNTER — Other Ambulatory Visit: Payer: Self-pay | Admitting: Obstetrics and Gynecology

## 2019-11-24 DIAGNOSIS — N644 Mastodynia: Secondary | ICD-10-CM

## 2019-12-18 ENCOUNTER — Other Ambulatory Visit: Payer: Self-pay

## 2019-12-18 ENCOUNTER — Ambulatory Visit
Admission: RE | Admit: 2019-12-18 | Discharge: 2019-12-18 | Disposition: A | Discharge status: Home / Self Care | Payer: Medicare Other | Source: Ambulatory Visit | Attending: Obstetrics and Gynecology | Admitting: Obstetrics and Gynecology

## 2019-12-18 DIAGNOSIS — N644 Mastodynia: Secondary | ICD-10-CM | POA: Diagnosis not present

## 2019-12-31 ENCOUNTER — Encounter: Payer: Self-pay | Admitting: Physical Medicine and Rehabilitation

## 2019-12-31 MED ORDER — BUPIVACAINE HCL 0.25 % IJ SOLN
4.0000 mL | INTRAMUSCULAR | Status: AC | PRN
  Administered 2019-10-04: 4 mL via INTRA_ARTICULAR

## 2019-12-31 MED ORDER — TRIAMCINOLONE ACETONIDE 40 MG/ML IJ SUSP
60.0000 mg | INTRAMUSCULAR | Status: AC | PRN
  Administered 2019-10-04: 60 mg via INTRA_ARTICULAR

## 2019-12-31 NOTE — Progress Notes (Signed)
Anna [name] - 74 y.o. female MRN [id number]  Date of birth: 03-25-1945  Office Visit Note: Visit Date: 10/04/2019 PCP: [name], MD Referred by: [name], MD  Subjective: Chief Complaint  Patient presents with  . Right Hip - Pain  . Neck - Pain   HPI: Anna [name] is a 74 y.o. female who comes in today At the request of Benita Stabile, PA-C for 2 specific issues.  Her biggest complaint is right hip and groin pain severe worsening over the last several months but a chronic problem over the last several years.  X-rays obtained by Artis Delay show near end-stage osteoarthritis of the right hip.  We have seen her in the past for intra-articular hip injection and that has done well for her at least for months at a time.  She has had physical therapy and time and medication management and her symptoms are just not getting any better over time.  She has been reluctant to entertain the idea of surgery.  Her symptoms are worse going from sit to stand and with the first few steps.  She has a lot of pain getting in and out of the car.  No left hip pain.  No specific back pain although we have seen her in the past for lumbar injections.  Secondary issue is left-sided neck pain.  She reports this pain started after a fall in 2018.  She reports a lot of spasming through this area.  She has had conservative care particularly with shoulder pain right and left through Dr. Ninfa Linden and Artis Delay.  She is actually seen several physicians in the office.  She has had physical therapy for her neck and shoulder.  No real relief from that.  She denies any radicular arm pain at all.  Saw focal neck pain and spasm.  She has not had any cervical spine surgery.  MRI of the cervical spine was obtained and this is reviewed below and reviewed in the notes below from the report.  Review of Systems  Musculoskeletal: Positive for joint pain and neck pain.  All other systems reviewed and are negative.  Otherwise per  HPI.  Assessment & Plan: Visit Diagnoses:  1. Pain in right hip   2. Unilateral primary osteoarthritis, right hip   3. Cervicalgia   4. Cervical spondylosis without myelopathy     Plan: Findings:  1.  Right hip and groin pain severe with almost end-stage osteoarthritis.  Will provide intra-articular hip injection today with fluoroscopic guidance.  Depending on relief she will follow up with Dr. Jean Rosenthal.  She likely would do well with hip replacement if needed.  Could repeat the injection at their schedule if she gets several months of relief.  This does not seem to be radicular in nature from her lumbar spine.  2.  In terms of her neck pain it is not her biggest pain complaint at this point and it is axial pain with spasming.  She thinks this happened after a fall in 2018.  I reassured her on the cervical spine MRI that there is no significant specific traumatic injury involved.  She does have multilevel facet arthropathy with mild central stenosis throughout and no cord compression.  At C5-6 she does have by foraminal stenosis but again no specific arm pain.  Could entertain the idea of a cervical epidural injection but I would first have her regroup with physical therapy for trigger point management.  She will call us depending on how  she is doing with her neck.    Meds & Orders: No orders of the defined types were placed in this encounter.   Orders Placed This Encounter  Procedures  . Large Joint Inj: R hip joint  . XR C-ARM NO REPORT    Follow-up: Return if symptoms worsen or fail to improve.   Procedures: Large Joint Inj: R hip joint on 10/04/2019 11:24 AM Indications: diagnostic evaluation and pain Details: 22 G 3.5 in needle, fluoroscopy-guided anterior approach  Arthrogram: No  Medications: 4 mL bupivacaine 0.25 %; 60 mg triamcinolone acetonide 40 MG/ML Outcome: tolerated well, no immediate complications  There was excellent flow of contrast producing a partial  arthrogram of the hip. The patient did have relief of symptoms during the anesthetic phase of the injection. Procedure, treatment alternatives, risks and benefits explained, specific risks discussed. Consent was given by the patient. Immediately prior to procedure a time out was called to verify the correct patient, procedure, equipment, support staff and site/side marked as required. Patient was prepped and draped in the usual sterile fashion.      No notes on file   Clinical History: MRI CERVICAL SPINE WITHOUT CONTRAST  TECHNIQUE: Multiplanar, multisequence MR imaging of the cervical spine was performed. No intravenous contrast was administered.  COMPARISON:  Cervical radiographs 08/30/2019.  FINDINGS: Alignment: Preserved cervical lordosis.  Vertebrae: No marrow edema or evidence of acute osseous abnormality. Visualized bone marrow signal is within normal limits.  Cord: No signal abnormality in the visible spinal cord. Fairly capacious visible upper thoracic spinal canal.  Posterior Fossa, vertebral arteries, paraspinal tissues: Cervicomedullary junction is within normal limits. Negative visible posterior fossa and brain parenchyma. Preserved major vascular flow voids in the neck. The left vertebral artery appears dominant. Partially retropharyngeal course of the carotid arteries (normal variant). Negative visible neck soft tissues and lung apices.  Disc levels:  C2-C3: Mild posterior disc bulging. Mild right facet hypertrophy. No stenosis.  C3-C4: Disc space loss with circumferential disc bulge and endplate spurring. Mild facet hypertrophy. Mild spinal and mild to moderate right C4 foraminal stenosis. Mild if any cord mass effect.  C4-C5: Minimal disc bulge. Mild endplate spurring. Mild to moderate facet hypertrophy greater on the left. Mild ligament flavum hypertrophy. Mild spinal stenosis. No cord mass effect. Moderate to severe left and mild-to-moderate  right C5 foraminal stenosis.  C5-C6: Disc space loss with mild circumferential disc bulge and endplate spurring. Mild to moderate facet hypertrophy greater on the left. Mild spinal stenosis. No cord mass effect. Moderate to severe bilateral C6 foraminal stenosis.  C6-C7: Disc space loss with circumferential disc bulge and mild endplate spurring. Mild spinal stenosis, no cord mass effect. Mild to moderate bilateral C7 foraminal stenosis.  C7-T1: Broad-based posterior and foraminal disc bulging eccentric to the right. Mild to moderate facet hypertrophy. No spinal stenosis. Moderate left and severe right C8 foraminal stenosis.  No visible upper thoracic stenosis.  IMPRESSION: 1. No acute osseous abnormality. Widespread cervical spine degeneration. 2. Mild multifactorial spinal stenosis from C3-C4 to C6-C7. Mild if any cord mass effect, and no cord signal abnormality. 3. Moderate or severe neural foraminal stenosis most pronounced at the left C5, bilateral C6, and bilateral C8 nerve levels.   Electronically Signed   By: Genevie Ann M.D.   On: 09/11/2019 03:23   She reports that she has never smoked. She has never used smokeless tobacco. No results for input(s): HGBA1C, LABURIC in the last 8760 hours.  Objective:  VS:  HT:  WT:   BMI:     BP:102/66  HR:81bpm  TEMP: ( )  RESP:  Physical Exam Vitals and nursing note reviewed.  Constitutional:      General: She is not in acute distress.    Appearance: Normal appearance. She is obese. She is not ill-appearing.  HENT:     Head: Normocephalic and atraumatic.     Right Ear: External ear normal.     Left Ear: External ear normal.  Eyes:     Extraocular Movements: Extraocular movements intact.  Cardiovascular:     Rate and Rhythm: Normal rate.     Pulses: Normal pulses.  Pulmonary:     Effort: Pulmonary effort is normal. No respiratory distress.  Abdominal:     General: There is no distension.     Palpations: Abdomen  is soft.  Musculoskeletal:     Cervical back: Neck supple. Tenderness present. No rigidity.     Right lower leg: No edema.     Left lower leg: No edema.     Comments: Patient has good strength in the upper extremities including 5 out of 5 strength in wrist extension long finger flexion and APB.  There is no atrophy of the hands intrinsically.  There is a negative Hoffmann's test.  She has focal trigger points in the paraspinal musculature bilateral trapezius and levator scapula.  She does have shoulder impingement signs bilaterally.  She has pain in the neck at end ranges of motion left and right.  She has concordant hip pain on the right with internal rotation.   Lymphadenopathy:     Cervical: No cervical adenopathy.  Skin:    Findings: No erythema, lesion or rash.  Neurological:     General: No focal deficit present.     Mental Status: She is alert and oriented to person, place, and time.     Sensory: No sensory deficit.     Motor: No weakness or abnormal muscle tone.     Coordination: Coordination normal.  Psychiatric:        Mood and Affect: Mood normal.        Behavior: Behavior normal.     Ortho Exam  Imaging: No results found.  Past Medical/Family/Surgical/Social History: Medications & Allergies reviewed per EMR, new medications updated. Patient Active Problem List   Diagnosis Date Noted  . Chronic right shoulder pain 03/07/2018  . Chronic migraine 08/11/2017   Past Medical History:  Diagnosis Date  . Breast cancer [facility])    age 42  . Headache   . Vision abnormalities    Family History  Problem Relation Age of Onset  . Hyperlipidemia Mother   . Hypertension Mother   . Alzheimer's disease Father   . Hypertension Sister   . Hypertension Sister   . Hypertension Sister   . Hypertension Brother   . Lung cancer Brother   . Hypertension Sister   . Hypertension Brother    Past Surgical History:  Procedure Laterality Date  . BACK SURGERY     lumbar region  .  BUNIONECTOMY Bilateral   . MASTECTOMY Left    at age 19  . TRIGGER FINGER RELEASE Left    left thumb  . TUBAL LIGATION     Social History   Occupational History  . Not on file  Tobacco Use  . Smoking status: Never Smoker  . Smokeless tobacco: Never Used  Substance and Sexual Activity  . Alcohol use: Never  . Drug use: Never  . Sexual activity:  Not Currently

## 2020-01-17 ENCOUNTER — Ambulatory Visit: Payer: Medicare Other | Admitting: Orthopaedic Surgery

## 2020-01-17 ENCOUNTER — Ambulatory Visit: Payer: Self-pay

## 2020-01-17 DIAGNOSIS — M1611 Unilateral primary osteoarthritis, right hip: Secondary | ICD-10-CM | POA: Diagnosis not present

## 2020-01-17 DIAGNOSIS — M25551 Pain in right hip: Secondary | ICD-10-CM

## 2020-01-17 NOTE — Progress Notes (Signed)
The patient is well-known to me.  She has significant arthritis in her right hip.  Dr. Junius Roads provided an ultrasound-guided steroid injection in that right hip on September 1 of this year.  That is been over 3 months now.  She is having enough pain that she would like to consider at least 1 more injection prior to surgery.  At this point I am not opposed to that.  On my exam she does have pain in the groin with internal and external rotation of her right hip.  Her left hip seems to be moving normally.  I did look at the x-rays once again before of her right hip and pelvis.  There is significant joint space narrowing on the right hip with a normal-appearing left hip.  We will see if Dr. Junius Roads can provide an injection soon under direct ultrasound with a steroid into the right hip.  She would then follow-up with me as needed unless he gets to the point where she is ready proceed with hip replacement surgery.  All question concerns were answered and addressed.

## 2020-01-17 NOTE — Progress Notes (Signed)
Subjective: Patient is here for ultrasound-guided intra-articular right hip injection.   Prior injection in September helped.  Objective:  Pain with IR.  Procedure: Ultrasound guided injection is preferred based studies that show increased duration, increased effect, greater accuracy, decreased procedural pain, increased response rate, and decreased cost with ultrasound guided versus blind injection.   Verbal informed consent obtained.  Time-out conducted.  Noted no overlying erythema, induration, or other signs of local infection. Ultrasound-guided right hip injection: After sterile prep with Betadine, injected 4 cc 0.25% bupivocaine without epinephrine and 6 mg betamethasone using a 22-gauge spinal needle, passing the needle through the iliofemoral ligament into the femoral head/neck junction.  Injectate seen filling joint capsule.

## 2020-01-31 ENCOUNTER — Other Ambulatory Visit: Payer: Self-pay | Admitting: Neurology

## 2020-02-01 ENCOUNTER — Other Ambulatory Visit: Payer: Self-pay | Admitting: Neurology

## 2020-02-12 ENCOUNTER — Ambulatory Visit: Payer: Medicare Other

## 2020-02-21 DIAGNOSIS — H2513 Age-related nuclear cataract, bilateral: Secondary | ICD-10-CM | POA: Diagnosis not present

## 2020-02-21 DIAGNOSIS — H5203 Hypermetropia, bilateral: Secondary | ICD-10-CM | POA: Diagnosis not present

## 2020-02-21 DIAGNOSIS — H43813 Vitreous degeneration, bilateral: Secondary | ICD-10-CM | POA: Diagnosis not present

## 2020-02-21 DIAGNOSIS — H25012 Cortical age-related cataract, left eye: Secondary | ICD-10-CM | POA: Diagnosis not present

## 2020-03-08 DIAGNOSIS — J321 Chronic frontal sinusitis: Secondary | ICD-10-CM | POA: Diagnosis not present

## 2020-05-13 DIAGNOSIS — Z1211 Encounter for screening for malignant neoplasm of colon: Secondary | ICD-10-CM | POA: Diagnosis not present

## 2020-05-13 DIAGNOSIS — K219 Gastro-esophageal reflux disease without esophagitis: Secondary | ICD-10-CM | POA: Diagnosis not present

## 2020-05-13 DIAGNOSIS — E782 Mixed hyperlipidemia: Secondary | ICD-10-CM | POA: Diagnosis not present

## 2020-05-30 DIAGNOSIS — Z1211 Encounter for screening for malignant neoplasm of colon: Secondary | ICD-10-CM | POA: Diagnosis not present

## 2020-06-03 DIAGNOSIS — L304 Erythema intertrigo: Secondary | ICD-10-CM | POA: Diagnosis not present

## 2020-06-03 DIAGNOSIS — M8589 Other specified disorders of bone density and structure, multiple sites: Secondary | ICD-10-CM | POA: Diagnosis not present

## 2020-06-18 ENCOUNTER — Ambulatory Visit: Payer: Medicare Other | Admitting: Orthopaedic Surgery

## 2020-06-18 ENCOUNTER — Other Ambulatory Visit: Payer: Self-pay

## 2020-06-18 ENCOUNTER — Encounter: Payer: Self-pay | Admitting: Orthopaedic Surgery

## 2020-06-18 DIAGNOSIS — M1711 Unilateral primary osteoarthritis, right knee: Secondary | ICD-10-CM

## 2020-06-18 DIAGNOSIS — M1611 Unilateral primary osteoarthritis, right hip: Secondary | ICD-10-CM | POA: Diagnosis not present

## 2020-06-18 NOTE — Progress Notes (Signed)
The patient is well-known to me.  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  She has had a hyaluronic acid injection and steroid injections in the right and left knees.  She has had a steroid injection or ultrasound of the left hip.  All of these have been last year.  She is wanting to consider joint replacement surgery.  However she has a trip in June and then 1 in September and she also consider replacing one of her joints whether it is her hip or her knee later this year.  She would like to consider steroid injections today.  However, she is scheduled to have her COVID booster shot tomorrow and is really appropriate to wait till later after she that injection before putting a steroid in her system which could drop her immune status.  She understands this fully.  On exam her right hip hurts with rotation and her right knee shows a mild effusion with varus malalignment and pain on medial joint line and patellofemoral joint with patellofemoral crepitation.  Previous x-rays do confirm severe arthritis of the right hip and both knees as well as the left shoulder.  We will see her back in 5 weeks.  At that visit I would like a weight and BMI calculation.  We will then likely inject her right knee with a steroid at that visit and consider setting her up for steroid injection and her right hip joint.  All questions and concerns were answered and addressed.  She agrees with the treatment plan.

## 2020-06-19 ENCOUNTER — Ambulatory Visit: Payer: Medicare Other | Admitting: Orthopaedic Surgery

## 2020-07-04 DIAGNOSIS — Z20822 Contact with and (suspected) exposure to covid-19: Secondary | ICD-10-CM | POA: Diagnosis not present

## 2020-07-23 ENCOUNTER — Encounter: Payer: Self-pay | Admitting: Orthopaedic Surgery

## 2020-07-23 ENCOUNTER — Ambulatory Visit: Payer: Medicare Other | Admitting: Orthopaedic Surgery

## 2020-07-23 VITALS — Ht 66.0 in | Wt 199.0 lb

## 2020-07-23 DIAGNOSIS — M1611 Unilateral primary osteoarthritis, right hip: Secondary | ICD-10-CM

## 2020-07-23 DIAGNOSIS — M1711 Unilateral primary osteoarthritis, right knee: Secondary | ICD-10-CM

## 2020-07-23 MED ORDER — LIDOCAINE HCL 1 % IJ SOLN
3.0000 mL | INTRAMUSCULAR | Status: AC | PRN
  Administered 2020-07-23: 3 mL

## 2020-07-23 MED ORDER — METHYLPREDNISOLONE ACETATE 40 MG/ML IJ SUSP
40.0000 mg | INTRAMUSCULAR | Status: AC | PRN
Start: 2020-07-23 — End: 2020-07-23
  Administered 2020-07-23: 40 mg via INTRA_ARTICULAR

## 2020-07-23 NOTE — Progress Notes (Signed)
Office Visit Note   Patient: Anna [name]           Date of Birth: [birth year]           MRN: [id number] Visit Date: 07/23/2020              Requested by: [name], MD [address] [place],  [place] [address] PCP: [name], MD   Assessment & Plan: Visit Diagnoses:  1. Primary osteoarthritis of right knee   2. Primary osteoarthritis of right hip     Plan: Per the patient's request I did provide a steroid injection in her right knee which she tolerated well.  She would like to hold off on a type of injection in her hip.  We talked again about the possibility of knee replacement and hip replacement surgery in the future.  At this point she said she will let us know when things worsen and would like to proceed with probably knee replacement first.  All questions and concerns were answered and addressed.  Follow-Up Instructions: Return if symptoms worsen or fail to improve.   Orders:  Orders Placed This Encounter  Procedures   Large Joint Inj   No orders of the defined types were placed in this encounter.     Procedures: Large Joint Inj: R knee on 07/23/2020 9:26 AM Indications: diagnostic evaluation and pain Details: 22 G 1.5 in needle, superolateral approach  Arthrogram: No  Medications: 3 mL lidocaine 1 %; 40 mg methylPREDNISolone acetate 40 MG/ML Outcome: tolerated well, no immediate complications Procedure, treatment alternatives, risks and benefits explained, specific risks discussed. Consent was given by the patient. Immediately prior to procedure a time out was called to verify the correct patient, procedure, equipment, support staff and site/side marked as required. Patient was prepped and draped in the usual sterile fashion.      Clinical Data: No additional findings.   Subjective: No chief complaint on file. Patient comes in today with chief complaint of right knee pain and right hip pain.  I have seen her for these issues before and  she has known significant end-stage arthritis of her right hip and her right knee.  She has had steroid injections in the both areas before.  Her right hip was injected back in December by Dr. Junius Roads and the hip joint itself under ultrasound.  Her right knee has been bothering her more than the hip.  She is 75 years old and now diabetic.  It is getting to the point where her right knee is definitely affecting her mobility, her actives daily living and her quality of life.  She would like to consider steroid injection in her right knee today.  She is still uncertain about when she would like to proceed with knee replacement or hip replacement surgery.  She has worked on activity modification as well.  Her BMI is 32.  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  HPI  Review of Systems Today there is no listed headache, chest pain, shortness of breath, fever, chills, nausea, vomiting  Objective: Vital Signs: Ht 5\' 6"  (1.676 m)   Wt 199 lb (90.3 kg)   BMI 32.12 kg/m   Physical Exam She is alert and orient x3 and in no acute distress.  She does ambulate slowly. Ortho Exam Examination right hip shows stiffness with internal and external rotation as well as pain in the groin with rotation.  Examination of the right knee shows varus malalignment with  a mild effusion.  There is patellofemoral crepitation and pain throughout the arc of motion of her right knee.  There is medial and lateral joint line tenderness as well. Specialty Comments:  No specialty comments available.  Imaging: No results found. Previous x-rays confirm severe end-stage arthritis of the right hip and the right knee.  PMFS History: Patient Active Problem List   Diagnosis Date Noted   Chronic right shoulder pain 03/07/2018   Chronic migraine 08/11/2017   Past Medical History:  Diagnosis Date   Breast cancer [facility])    age 56   Headache    Vision abnormalities     Family History  Problem Relation Age of Onset    Hyperlipidemia Mother    Hypertension Mother    Alzheimer's disease Father    Hypertension Sister    Hypertension Sister    Hypertension Sister    Hypertension Brother    Lung cancer Brother    Hypertension Sister    Hypertension Brother     Past Surgical History:  Procedure Laterality Date   BACK SURGERY     lumbar region   BUNIONECTOMY Bilateral    MASTECTOMY Left    at age 89   TRIGGER FINGER RELEASE Left    left thumb   TUBAL LIGATION     Social History   Occupational History   Not on file  Tobacco Use   Smoking status: Never   Smokeless tobacco: Never  Substance and Sexual Activity   Alcohol use: Never   Drug use: Never   Sexual activity: Not Currently

## 2020-07-26 ENCOUNTER — Other Ambulatory Visit: Payer: Self-pay

## 2020-07-26 ENCOUNTER — Ambulatory Visit
Admission: RE | Admit: 2020-07-26 | Discharge: 2020-07-26 | Disposition: A | Discharge status: Home / Self Care | Payer: Medicare Other | Source: Ambulatory Visit | Attending: Internal Medicine | Admitting: Internal Medicine

## 2020-07-26 ENCOUNTER — Other Ambulatory Visit: Payer: Self-pay | Admitting: Internal Medicine

## 2020-07-26 DIAGNOSIS — S0990XA Unspecified injury of head, initial encounter: Secondary | ICD-10-CM | POA: Diagnosis not present

## 2020-07-26 DIAGNOSIS — M79641 Pain in right hand: Secondary | ICD-10-CM | POA: Diagnosis not present

## 2020-07-26 DIAGNOSIS — M25551 Pain in right hip: Secondary | ICD-10-CM | POA: Diagnosis not present

## 2020-07-26 DIAGNOSIS — R519 Headache, unspecified: Secondary | ICD-10-CM | POA: Diagnosis not present

## 2020-07-26 DIAGNOSIS — M25561 Pain in right knee: Secondary | ICD-10-CM | POA: Diagnosis not present

## 2020-07-26 DIAGNOSIS — Z9181 History of falling: Secondary | ICD-10-CM | POA: Diagnosis not present

## 2020-07-29 ENCOUNTER — Telehealth: Payer: Self-pay

## 2020-07-29 ENCOUNTER — Telehealth: Payer: Self-pay | Admitting: Physical Medicine and Rehabilitation

## 2020-07-29 ENCOUNTER — Ambulatory Visit: Payer: Medicare Other | Admitting: Orthopaedic Surgery

## 2020-07-29 DIAGNOSIS — M25551 Pain in right hip: Secondary | ICD-10-CM | POA: Diagnosis not present

## 2020-07-29 DIAGNOSIS — M1611 Unilateral primary osteoarthritis, right hip: Secondary | ICD-10-CM | POA: Diagnosis not present

## 2020-07-29 DIAGNOSIS — M25561 Pain in right knee: Secondary | ICD-10-CM | POA: Diagnosis not present

## 2020-07-29 DIAGNOSIS — M1711 Unilateral primary osteoarthritis, right knee: Secondary | ICD-10-CM

## 2020-07-29 DIAGNOSIS — G8929 Other chronic pain: Secondary | ICD-10-CM

## 2020-07-29 NOTE — Telephone Encounter (Signed)
Pt called wanting a CB from Dr. Kennon Portela office.  Pt stated she fell Thursday and wanted a follow up.  Pt went to her PCP but she is experiencing sharp pains in her lower back / hip area.

## 2020-07-29 NOTE — Progress Notes (Signed)
The patient comes in today about 5 days after she sustained mechanical fall injuring her right hip and her right knee.  I actually saw her earlier this month and place a steroid injection in her right knee.  She has well-documented severe end-stage arthritis of the right hip and the right knee.  After she fell a CT scan was obtained of her head which was negative for anything intracranial.  Plain films were performed again of her right hip and her right knee that were done elsewhere and these were negative for any type of fracture or acute findings other than severe arthritis of her right hip and arthritis of her right knee.  I had only injected her right knee just over a week ago.  I have recommended at this point a steroid injection her right hip joint to see if this will calm down some of the acute inflammation from her known severe arthritis of the right hip.  At some point she will likely proceed with joint replacement surgery for what will be likely the right hip first.  She is not interested in at least having Dr. Junius Roads place a steroid injection under ultrasound guidance in her right hip joint.  When she sees him for that injection, he should get her a follow-up appoint with me 2 to 3 weeks later.  She agrees with this treatment plan.

## 2020-07-29 NOTE — Telephone Encounter (Signed)
Called pt and informed her to make a appt with Dr. Ninfa Linden.

## 2020-07-30 ENCOUNTER — Other Ambulatory Visit: Payer: Self-pay | Admitting: Internal Medicine

## 2020-08-02 ENCOUNTER — Other Ambulatory Visit: Payer: Self-pay

## 2020-08-02 ENCOUNTER — Encounter: Payer: Self-pay | Admitting: Family Medicine

## 2020-08-02 ENCOUNTER — Ambulatory Visit (INDEPENDENT_AMBULATORY_CARE_PROVIDER_SITE_OTHER): Payer: Medicare Other | Admitting: Family Medicine

## 2020-08-02 ENCOUNTER — Ambulatory Visit: Payer: Self-pay

## 2020-08-02 DIAGNOSIS — M1611 Unilateral primary osteoarthritis, right hip: Secondary | ICD-10-CM | POA: Diagnosis not present

## 2020-08-02 NOTE — Progress Notes (Signed)
Subjective: Patient is here for ultrasound-guided intra-articular right hip injection.   Pain from DJD.  Objective:  Pain with IR.  Walks with a limp.  Procedure: Ultrasound guided injection is preferred based studies that show increased duration, increased effect, greater accuracy, decreased procedural pain, increased response rate, and decreased cost with ultrasound guided versus blind injection.   Verbal informed consent obtained.  Time-out conducted.  Noted no overlying erythema, induration, or other signs of local infection. Ultrasound-guided right hip injection: After sterile prep with Betadine, injected 4 cc 0.25% bupivacaine without epinephrine and 6 mg betamethasone using a 22-gauge spinal needle, passing the needle through the iliofemoral ligament into the femoral head/neck junction.  Injectate seen filling joint capsule.  Follow up with Dr. Ninfa Linden in 3-4 weeks.

## 2020-08-07 ENCOUNTER — Ambulatory Visit
Admission: EM | Admit: 2020-08-07 | Discharge: 2020-08-07 | Disposition: A | Discharge status: Home / Self Care | Payer: Medicare Other | Source: Home / Self Care

## 2020-08-07 ENCOUNTER — Emergency Department (HOSPITAL_COMMUNITY)
Admission: EM | Admit: 2020-08-07 | Discharge: 2020-08-08 | Disposition: A | Discharge status: Home / Self Care | Payer: Medicare Other | Attending: Emergency Medicine | Admitting: Emergency Medicine

## 2020-08-07 ENCOUNTER — Other Ambulatory Visit: Payer: Self-pay

## 2020-08-07 ENCOUNTER — Emergency Department (HOSPITAL_COMMUNITY): Payer: Medicare Other

## 2020-08-07 ENCOUNTER — Encounter: Payer: Self-pay | Admitting: Emergency Medicine

## 2020-08-07 DIAGNOSIS — M549 Dorsalgia, unspecified: Secondary | ICD-10-CM

## 2020-08-07 DIAGNOSIS — R61 Generalized hyperhidrosis: Secondary | ICD-10-CM | POA: Insufficient documentation

## 2020-08-07 DIAGNOSIS — R0789 Other chest pain: Secondary | ICD-10-CM | POA: Insufficient documentation

## 2020-08-07 DIAGNOSIS — Z853 Personal history of malignant neoplasm of breast: Secondary | ICD-10-CM | POA: Diagnosis not present

## 2020-08-07 DIAGNOSIS — R079 Chest pain, unspecified: Secondary | ICD-10-CM | POA: Diagnosis not present

## 2020-08-07 HISTORY — DX: Other specified disorders of bone density and structure, unspecified site: M85.80

## 2020-08-07 HISTORY — DX: Unspecified osteoarthritis, unspecified site: M19.90

## 2020-08-07 LAB — CBC WITH DIFFERENTIAL/PLATELET
Abs Immature Granulocytes: 0.04 10*3/uL (ref 0.00–0.07)
Basophils Absolute: 0 10*3/uL (ref 0.0–0.1)
Basophils Relative: 0 %
Eosinophils Absolute: 0.1 10*3/uL (ref 0.0–0.5)
Eosinophils Relative: 1 %
HCT: 39.7 % (ref 36.0–46.0)
Hemoglobin: 12.9 g/dL (ref 12.0–15.0)
Immature Granulocytes: 0 %
Lymphocytes Relative: 38 %
Lymphs Abs: 3.7 10*3/uL (ref 0.7–4.0)
MCH: 30.9 pg (ref 26.0–34.0)
MCHC: 32.5 g/dL (ref 30.0–36.0)
MCV: 95 fL (ref 80.0–100.0)
Monocytes Absolute: 0.8 10*3/uL (ref 0.1–1.0)
Monocytes Relative: 8 %
Neutro Abs: 5.1 10*3/uL (ref 1.7–7.7)
Neutrophils Relative %: 53 %
Platelets: 253 10*3/uL (ref 150–400)
RBC: 4.18 MIL/uL (ref 3.87–5.11)
RDW: 14.1 % (ref 11.5–15.5)
WBC: 9.7 10*3/uL (ref 4.0–10.5)
nRBC: 0 % (ref 0.0–0.2)

## 2020-08-07 LAB — COMPREHENSIVE METABOLIC PANEL
ALT: 16 U/L (ref 0–44)
AST: 19 U/L (ref 15–41)
Albumin: 4 g/dL (ref 3.5–5.0)
Alkaline Phosphatase: 74 U/L (ref 38–126)
Anion gap: 9 (ref 5–15)
BUN: 24 mg/dL — ABNORMAL HIGH (ref 8–23)
CO2: 26 mmol/L (ref 22–32)
Calcium: 9.4 mg/dL (ref 8.9–10.3)
Chloride: 103 mmol/L (ref 98–111)
Creatinine, Ser: 0.96 mg/dL (ref 0.44–1.00)
GFR, Estimated: >60 mL/min (ref >60)
Glucose, Bld: 100 mg/dL — ABNORMAL HIGH (ref 70–99)
Potassium: 4 mmol/L (ref 3.5–5.1)
Sodium: 138 mmol/L (ref 135–145)
Total Bilirubin: 0.9 mg/dL (ref 0.3–1.2)
Total Protein: 7.5 g/dL (ref 6.5–8.1)

## 2020-08-07 LAB — TROPONIN I (HIGH SENSITIVITY): Troponin I (High Sensitivity): 6 ng/L (ref <18)

## 2020-08-07 NOTE — ED Triage Notes (Signed)
C/O chest pain started this afternoon,

## 2020-08-07 NOTE — ED Provider Notes (Signed)
Emergency Medicine Provider Triage Evaluation Note  Anna [name] , a 75 y.o. female  was evaluated in triage.  Pt complains of chest pain.  Chest pain started today between 1700-1800.  Pain is midsternal and does not radiate.  Patient describes pain as a pressure.  Endorses associated diaphoresis.  No associated shortness of breath, nausea, vomiting, abdominal pain, palpitations, leg swelling.  Patient also endorses back pain.  Patient has a history of back pain.  Patient reports that his back pain feels different from  Review of Systems  Positive: Chest pain, diaphoresis Negative: hortness of breath, nausea, vomiting, abdominal pain, palpitations, leg swelling  Physical Exam  BP 133/80 (BP Location: Right Arm)   Pulse 73   Temp 98.1 F (36.7 C) (Oral)   Resp 18   Ht 5\' 6"  (1.676 m)   Wt 89.8 kg   SpO2 100%   BMI 31.96 kg/m  Gen:   Awake, no distress   Resp:  Normal effort, lungs clear to auscultation bilaterally MSK:   Moves extremities without difficulty.  No swelling, edema, or tenderness to bilateral lower extremities Other:  +2 carotid pulse bilaterally.  S1, S2 present with no murmurs rubs or gallops.  Medical Decision Making  Medically screening exam initiated at 9:23 PM.  Appropriate orders placed.  Anna [name] was informed that the remainder of the evaluation will be completed by another provider, this initial triage assessment does not replace that evaluation, and the importance of remaining in the ED until their evaluation is complete.  The patient appears stable so that the remainder of the work up may be completed by another provider.      Loni Beckwith, PA-C 08/07/20 2131    Lorelle Gibbs, DO 08/08/20 0009

## 2020-08-07 NOTE — ED Provider Notes (Signed)
[facility]    CSN: [id number] Arrival date & time: 08/07/20  1914      History   Chief Complaint Chief Complaint  Patient presents with   Back Pain   Chest Pain    HPI Anna [name] is a 75 y.o. female.   Patient reports chest pain that started earlier today.  Denies any cardiac history.  Patient also endorses back pain that started "weeks ago".  Patient states that she does have chronic back pain due to arthritis but this pain "feels different".  Patient describes pain as sharp and stabbing that is intermittent.  No radiation to either one of her arms.  Denies any shortness of breath.   Back Pain Associated symptoms: chest pain   Chest Pain Associated symptoms: back pain    Past Medical History:  Diagnosis Date   Arthritis    Breast cancer [facility])    age 57   Headache    Osteopenia    Vision abnormalities     Patient Active Problem List   Diagnosis Date Noted   Chronic right shoulder pain 03/07/2018   Chronic migraine 08/11/2017    Past Surgical History:  Procedure Laterality Date   BACK SURGERY     lumbar region   BUNIONECTOMY Bilateral    MASTECTOMY Left    at age 31   TRIGGER FINGER RELEASE Left    left thumb   TUBAL LIGATION      OB History   No obstetric history on file.      Home Medications    Prior to Admission medications   Medication Sig Start Date End Date Taking? Authorizing Provider  ferrous sulfate (SLOW FE) 160 (50 Fe) MG TBCR SR tablet Take 1 tablet by mouth daily.   Yes [provider]  levothyroxine (SYNTHROID) 100 MCG tablet Take 100 mcg by mouth daily. 05/01/19  Yes [provider]  methocarbamol (ROBAXIN) 500 MG tablet Take 1 tablet (500 mg total) by mouth every 6 (six) hours as needed for muscle spasms (muscle spasm). 07/12/19  Yes Persons, [name], PA  omeprazole (PRILOSEC) 20 MG capsule Take 20 mg by mouth daily.   Yes [provider]  pravastatin (PRAVACHOL) 40 MG tablet Take 40 mg by  mouth daily.  12/28/16  Yes [provider]  acetaminophen (TYLENOL) 325 MG tablet Take 650 mg by mouth every 6 (six) hours as needed.    [provider]  Calcium 600-200 MG-UNIT tablet Take 2 tablets by mouth daily.    [provider]  gabapentin (NEURONTIN) 100 MG capsule TAKE 1 CAPSULE BY MOUTH THREE TIMES DAILY 09/06/18   [name], PA-C  IBUPROFEN PO Take 800 mg tablet by mouth 1-3 times daily    [provider]  lidocaine (LIDODERM) 5 % Place 1 patch onto the skin daily. Remove & Discard patch within 12 hours or as directed by MD 08/01/19   [name], PA-C  SUMAtriptan (IMITREX) 25 MG tablet Take 1 tab at onset of migraine.  May repeat in 2 hrs, if needed.  Max dose: 2 tabs/day. This is a 30 day prescription. 10/03/18   [name], MD  tiZANidine (ZANAFLEX) 4 MG tablet Take 1 tablet (4 mg total) by mouth every 6 (six) hours as needed for muscle spasms. 04/04/18   [name], MD    Family History Family History  Problem Relation Age of Onset   Hyperlipidemia Mother    Hypertension Mother    Alzheimer's  disease Father    Hypertension Sister    Hypertension Sister    Hypertension Sister    Hypertension Brother    Lung cancer Brother    Hypertension Sister    Hypertension Brother     Social History Social History   Tobacco Use   Smoking status: Never   Smokeless tobacco: Never  Substance Use Topics   Alcohol use: Never   Drug use: Never     Allergies   Sulfa antibiotics   Review of Systems Review of Systems  Cardiovascular:  Positive for chest pain.  Musculoskeletal:  Positive for back pain.  Per HPI  Physical Exam Triage Vital Signs ED Triage Vitals  Enc Vitals Group     BP 08/07/20 2024 121/74     Pulse Rate 08/07/20 2024 73     Resp 08/07/20 2024 17     Temp 08/07/20 2024 98 F (36.7 C)     Temp Source 08/07/20 2024 Oral     SpO2 08/07/20 2024 97 %     Weight --      Height --      Head  Circumference --      Peak Flow --      Pain Score 08/07/20 2021 8     Pain Loc --      Pain Edu? --      Excl. in [place]? --    No data found.  Updated Vital Signs BP 121/74 (BP Location: Left Arm)   Pulse 73   Temp 98 F (36.7 C) (Oral)   Resp 17   SpO2 97%   Visual Acuity Right Eye Distance:   Left Eye Distance:   Bilateral Distance:    Right Eye Near:   Left Eye Near:    Bilateral Near:     Physical Exam Constitutional:      General: She is not in acute distress.    Appearance: Normal appearance.  HENT:     Head: Normocephalic and atraumatic.  Eyes:     Extraocular Movements: Extraocular movements intact.     Conjunctiva/sclera: Conjunctivae normal.  Cardiovascular:     Rate and Rhythm: Normal rate and regular rhythm.     Pulses: Normal pulses.     Heart sounds: Normal heart sounds.  Pulmonary:     Effort: Pulmonary effort is normal.     Breath sounds: Normal breath sounds.  Musculoskeletal:        General: No tenderness.  Neurological:     General: No focal deficit present.     Mental Status: She is alert and oriented to person, place, and time. Mental status is at baseline.  Psychiatric:        Mood and Affect: Mood normal.        Behavior: Behavior normal.        Thought Content: Thought content normal.        Judgment: Judgment normal.     UC Treatments / Results  Labs (all labs ordered are listed, but only abnormal results are displayed) Labs Reviewed - No data to display  EKG   Radiology No results found.  Procedures Procedures (including critical care time)  Medications Ordered in UC Medications - No data to display  Initial Impression / Assessment and Plan / UC Course  I have reviewed the triage vital signs and the nursing notes.  Pertinent labs & imaging results that were available during my care of the patient were reviewed by me and considered in my medical  decision making (see chart for details).     EKG showing sinus rhythm  with T wave abnormality.  Patient was advised that she should go to the hospital to be further evaluated.  Patient was agreeable to plan and has daughter here to transport her to the hospital.  Suggested EMS transportation due to abnormal EKG but patient refused.  Daughter is agreeable to take patient to the hospital.  Patient's vital signs were stable at transport patient was not in any acute distress. Final Clinical Impressions(s) / UC Diagnoses   Final diagnoses:  Other chest pain     Discharge Instructions      Please go to the hospital today as soon as you leave urgent care.      ED Prescriptions   None    PDMP not reviewed this encounter.   Odis Luster, FNP 08/07/20 2045

## 2020-08-07 NOTE — Discharge Instructions (Addendum)
Please go to the hospital today as soon as you leave urgent care.

## 2020-08-07 NOTE — ED Triage Notes (Signed)
Patient c/o upper back pain x " a long time ago".  Patient c/o bilateral chest pain that started today.   Patient denies SOB, Dizziness, and fever.   Patient endorses having a fall 2 weeks ago when pain progressively became worst.   History of Arthritis.   Patient has taken Tylenol and Muscle Relaxer with no relief of symptoms.

## 2020-08-08 LAB — TROPONIN I (HIGH SENSITIVITY): Troponin I (High Sensitivity): 6 ng/L (ref <18)

## 2020-08-08 MED ORDER — OMEPRAZOLE 20 MG PO CPDR: 20.0000 mg | DELAYED_RELEASE_CAPSULE | Freq: Every day | ORAL | 0 refills | Status: AC

## 2020-08-08 MED ORDER — ALUM & MAG HYDROXIDE-SIMETH 200-200-20 MG/5ML PO SUSP
30.0000 mL | Freq: Once | ORAL | Status: AC
  Administered 2020-08-08: 30 mL via ORAL
  Filled 2020-08-08: qty 30

## 2020-08-08 MED ORDER — LIDOCAINE VISCOUS HCL 2 % MT SOLN
15.0000 mL | Freq: Once | OROMUCOSAL | Status: AC
  Administered 2020-08-08: 15 mL via ORAL
  Filled 2020-08-08: qty 15

## 2020-08-08 NOTE — ED Provider Notes (Signed)
  Face-to-face evaluation   History: Patient presented yesterday evening for evaluation of chest pain.  After a prolonged ED waiting room stay, she was transferred to a room for evaluation.  Patient seen by me at 2:50 PM.  She reports onset of upper back pain, 2 weeks ago after fall.  She was evaluated by her PCP and had imaging done which did not show any serious problem.  Yesterday she developed chest pain which has been persistent, and is still present at this time.  The pain is dull.  There is no associated shortness of breath, nausea, vomiting, diaphoresis, weakness dizziness.  She has previously been evaluated by cardiology, and had a cardiac stress test and echocardiogram, 2 years ago.  She cannot remember exactly why those tests were done.  She was told that they did not show anything serious.  Physical exam: Elderly patient alert and cooperative.  Chest is nontender to palpation.  Heart regular rate and rhythm without murmur.  Lungs clear to auscultation.  Abdomen soft and nontender.  Medical screening examination/treatment/procedure(s) were conducted as a shared visit with non-physician practitioner(s) and myself.  I personally evaluated the patient during the encounter    Daleen Bo, MD 08/10/20 1329

## 2020-08-08 NOTE — Discharge Instructions (Addendum)
Seen today in the ED for chest pain.  Work-up did not show any signs of emergent disease.  Please take Prilosec once in the morning 30 minutes before your first meal of the day.  It is for next 4 weeks.  Please discuss your visit today with your primary care doctor during your visit tomorrow.  I also want you to call the cardiologist and set up an appointment for follow-up in the next few weeks..  If symptoms change or worsen please return back to the ED for further evaluation.

## 2020-08-08 NOTE — ED Provider Notes (Signed)
[facility] EMERGENCY DEPARTMENT Provider Note   CSN: [id number] Arrival date & time: 08/07/20  2059     History Chief Complaint  Patient presents with   Chest Pain    Anna [name] is a 75 y.o. female.  HPI  Patient presents with chest pain x1 day.  Pain started yesterday between 1718 100.  Pain is midsternal and radiates to her left shoulder.  Describes the pain as a pressure with associated diaphoresis.  Pain is constant, currently a 7 out of 10.  Worse with inhalation.  Does not change with lying flat.  Patient does report a history of GERD.  States she was belching a lot yesterday more than normal.  She initially went to urgent care, urgent care sent her to the ER because of atypical changes to the ST segments on EKG.  She also has back pain that started a few hours before, she does have a history of back pain.  States this feels like a pressure.  Also notes she fell about a week ago on her back.  Pain is worse with movement.  She told the urgent care provider that the pain started weeks ago, but she told me it started yesterday.  States it feels different from her normal back pain is a sharp stabbing pain that comes and goes.  Patient has hyperlipidemia, but no history of hypertension or diabetes.  No history of blood clots, MI, stroke.  Past Medical History:  Diagnosis Date   Arthritis    Breast cancer [facility])    age 48   Headache    Osteopenia    Vision abnormalities     Patient Active Problem List   Diagnosis Date Noted   Chronic right shoulder pain 03/07/2018   Chronic migraine 08/11/2017    Past Surgical History:  Procedure Laterality Date   BACK SURGERY     lumbar region   BUNIONECTOMY Bilateral    MASTECTOMY Left    at age 40   TRIGGER FINGER RELEASE Left    left thumb   TUBAL LIGATION       OB History   No obstetric history on file.     Family History  Problem Relation Age of Onset   Hyperlipidemia Mother    Hypertension  Mother    Alzheimer's disease Father    Hypertension Sister    Hypertension Sister    Hypertension Sister    Hypertension Brother    Lung cancer Brother    Hypertension Sister    Hypertension Brother     Social History   Tobacco Use   Smoking status: Never   Smokeless tobacco: Never  Substance Use Topics   Alcohol use: Never   Drug use: Never    Home Medications Prior to Admission medications   Medication Sig Start Date End Date Taking? Authorizing Provider  acetaminophen (TYLENOL) 325 MG tablet Take 650 mg by mouth every 6 (six) hours as needed.    [provider]  Calcium 600-200 MG-UNIT tablet Take 2 tablets by mouth daily.    [provider]  ferrous sulfate (SLOW FE) 160 (50 Fe) MG TBCR SR tablet Take 1 tablet by mouth daily.    [provider]  gabapentin (NEURONTIN) 100 MG capsule TAKE 1 CAPSULE BY MOUTH THREE TIMES DAILY 09/06/18   [name], PA-C  IBUPROFEN PO Take 800 mg tablet by mouth 1-3 times daily    [provider]  levothyroxine (SYNTHROID) 100 MCG tablet Take  100 mcg by mouth daily. 05/01/19   [provider]  lidocaine (LIDODERM) 5 % Place 1 patch onto the skin daily. Remove & Discard patch within 12 hours or as directed by MD 08/01/19   [name], PA-C  methocarbamol (ROBAXIN) 500 MG tablet Take 1 tablet (500 mg total) by mouth every 6 (six) hours as needed for muscle spasms (muscle spasm). 07/12/19   Persons, [name], PA  omeprazole (PRILOSEC) 20 MG capsule Take 20 mg by mouth daily.    [provider]  pravastatin (PRAVACHOL) 40 MG tablet Take 40 mg by mouth daily.  12/28/16   [provider]  SUMAtriptan (IMITREX) 25 MG tablet Take 1 tab at onset of migraine.  May repeat in 2 hrs, if needed.  Max dose: 2 tabs/day. This is a 30 day prescription. 10/03/18   [name], MD  tiZANidine (ZANAFLEX) 4 MG tablet Take 1 tablet (4 mg total) by mouth every 6 (six) hours as needed for muscle spasms.  04/04/18   [name], MD    Allergies    Sulfa antibiotics  Review of Systems   Review of Systems  Constitutional:  Positive for diaphoresis. Negative for chills and fever.  HENT:  Negative for ear pain and sore throat.   Eyes:  Negative for pain and visual disturbance.  Respiratory:  Negative for cough and shortness of breath.   Cardiovascular:  Positive for chest pain. Negative for palpitations and leg swelling.  Gastrointestinal:  Negative for abdominal pain, nausea and vomiting.  Genitourinary:  Negative for dysuria and hematuria.  Musculoskeletal:  Positive for back pain. Negative for arthralgias.  Skin:  Negative for color change and rash.  Neurological:  Negative for seizures and syncope.  All other systems reviewed and are negative.  Physical Exam Updated Vital Signs BP 123/71 (BP Location: Right Arm)   Pulse 75   Temp 97.8 F (36.6 C) (Oral)   Resp 15   Ht 5\' 6"  (1.676 m)   Wt 89.8 kg   SpO2 100%   BMI 31.96 kg/m   Physical Exam Vitals and nursing note reviewed. Exam conducted with a chaperone present.  Constitutional:      Appearance: Normal appearance. She is well-developed.  HENT:     Head: Normocephalic and atraumatic.  Eyes:     General: No scleral icterus.       Right eye: No discharge.        Left eye: No discharge.     Extraocular Movements: Extraocular movements intact.     Pupils: Pupils are equal, round, and reactive to light.  Cardiovascular:     Rate and Rhythm: Normal rate and regular rhythm.     Pulses: Normal pulses.     Heart sounds: Normal heart sounds. No murmur heard.   No friction rub. No gallop.  Pulmonary:     Effort: Pulmonary effort is normal. No tachypnea, accessory muscle usage or respiratory distress.     Breath sounds: Normal breath sounds.     Comments: Lungs are clear to auscultation bilaterally Chest:     Chest wall: No mass or tenderness.  Abdominal:     General: Abdomen is flat. Bowel sounds are normal.  There is no distension.     Palpations: Abdomen is soft.     Tenderness: There is no abdominal tenderness.  Musculoskeletal:        General: Normal range of motion.     Comments: Surgical scar from previous spinal surgeries.  No point  tenderness to palpation.  No palpable deformities or crepitus.  Legs are symmetrical, no pitting edema or erythema.  Negative Homans  Skin:    General: Skin is warm and dry.     Coloration: Skin is not jaundiced.  Neurological:     Mental Status: She is alert. Mental status is at baseline.     Coordination: Coordination normal.    ED Results / Procedures / Treatments   Labs (all labs ordered are listed, but only abnormal results are displayed) Labs Reviewed  COMPREHENSIVE METABOLIC PANEL - Abnormal; Notable for the following components:      Result Value   Glucose, Bld 100 (*)    BUN 24 (*)    All other components within normal limits  CBC WITH DIFFERENTIAL/PLATELET  TROPONIN I (HIGH SENSITIVITY)  TROPONIN I (HIGH SENSITIVITY)    EKG None  Radiology DG Chest 2 View  Result Date: 08/07/2020 CLINICAL DATA:  Chest pain and tightness. EXAM: CHEST - 2 VIEW COMPARISON:  03/29/2017 FINDINGS: Previous median sternotomy and CABG procedure. No pleural effusion or edema. No airspace densities. Surgical clips identified within the left axilla. Mild spondylosis within the thoracic spine. IMPRESSION: No acute cardiopulmonary abnormalities. Electronically Signed   By: Kerby Moors M.D.   On: 08/07/2020 21:57    Procedures Procedures   Medications Ordered in ED Medications  alum & mag hydroxide-simeth (MAALOX/MYLANTA) 200-200-20 MG/5ML suspension 30 mL (has no administration in time range)    And  lidocaine (XYLOCAINE) 2 % viscous mouth solution 15 mL (has no administration in time range)    ED Course  I have reviewed the triage vital signs and the nursing notes.  Pertinent labs & imaging results that were available during my care of the patient were  reviewed by me and considered in my medical decision making (see chart for details).  Clinical Course as of 08/08/20 1758  Thu Aug 08, 2020  1458 Troponin I (High Sensitivity): 6 No elevation from initial troponin of 6. [HS]  1501 Comprehensive metabolic panel(!) Electrolyte arrangement, no AKI, no elevated LFTs [HS]  1501 CBC with Differential No elevated white count concerning for infectious etiology.  She is not anemic. [HS]  1502 DG Chest 2 View No pneumonia, pleural effusion, cardiomegaly, pneumothorax [HS]    Clinical Course User Index [HS] [name], PA-C   MDM Rules/Calculators/A&P                          I will repeat EKG given initial EKG showed reversal.  Her work-up has been very reassuring.  S1-S2 without murmurs rubs or gallops.  There is no leg swelling.  She does have back pain, I do not think this is related to the chest pain.  I doubt aortic dissection given no hypertension or smoking history.  It also has been more insidious in nature.  Pain is also reproducible on exam.  Suspect her chest pain may be related to GERD.  Given her initial GI cocktail will reevaluate.  I doubt ACS given ST changes or delta troponin.  She also does not have any cardiac risk factors.  I think she would be appropriate for cardiology follow-up outpatient.  Consider pneumonia, does not seem likely given the chest x-ray and lack of white count.  Additional information: Patient was seen 2 years ago by cardiologist Dr. Johnsie Cancel.  She had a cardiac stress test which came back unremarkable.  Re-eval: patient reports improvement of pain with GI cocktail.  I will give her omeprazole trial.  Vital signs are stable, she is nontoxic-appearing.  She reports improvement in chest pain after drinking GI cocktail.  Discussed results with the patient.  She expresses agreement with outpatient cardiology follow-up.  Return precautions were given.  Patient expresses understanding and agreement.  Patient also has  a appointment with her PCP tomorrow morning.  Discussed todays visit with her PCP.  Discussed HPI, physical exam and plan of care for this patient with attending Christ Kick. The attending physician evaluated this patient as part of a shared visit and agrees with plan of care.   Final Clinical Impression(s) / ED Diagnoses Final diagnoses:  None    Rx / DC Orders ED Discharge Orders     None        [name], [place] 08/08/20 1759    Daleen Bo, MD 08/10/20 1330

## 2020-08-09 DIAGNOSIS — M546 Pain in thoracic spine: Secondary | ICD-10-CM | POA: Diagnosis not present

## 2020-08-09 DIAGNOSIS — M545 Low back pain, unspecified: Secondary | ICD-10-CM | POA: Diagnosis not present

## 2020-08-09 DIAGNOSIS — G8929 Other chronic pain: Secondary | ICD-10-CM | POA: Diagnosis not present

## 2020-08-12 ENCOUNTER — Encounter (HOSPITAL_COMMUNITY): Payer: Self-pay | Admitting: *Deleted

## 2020-08-12 ENCOUNTER — Emergency Department (HOSPITAL_COMMUNITY): Payer: Medicare Other

## 2020-08-12 ENCOUNTER — Emergency Department (HOSPITAL_COMMUNITY)
Admission: EM | Admit: 2020-08-12 | Discharge: 2020-08-13 | Disposition: A | Discharge status: Home / Self Care | Payer: Medicare Other | Attending: Emergency Medicine | Admitting: Emergency Medicine

## 2020-08-12 ENCOUNTER — Other Ambulatory Visit: Payer: Self-pay

## 2020-08-12 DIAGNOSIS — J9811 Atelectasis: Secondary | ICD-10-CM | POA: Diagnosis not present

## 2020-08-12 DIAGNOSIS — R61 Generalized hyperhidrosis: Secondary | ICD-10-CM | POA: Diagnosis not present

## 2020-08-12 DIAGNOSIS — Z853 Personal history of malignant neoplasm of breast: Secondary | ICD-10-CM | POA: Insufficient documentation

## 2020-08-12 DIAGNOSIS — R0789 Other chest pain: Secondary | ICD-10-CM | POA: Insufficient documentation

## 2020-08-12 DIAGNOSIS — R079 Chest pain, unspecified: Secondary | ICD-10-CM

## 2020-08-12 LAB — CBC
HCT: 37.7 % (ref 36.0–46.0)
Hemoglobin: 12.1 g/dL (ref 12.0–15.0)
MCH: 30.7 pg (ref 26.0–34.0)
MCHC: 32.1 g/dL (ref 30.0–36.0)
MCV: 95.7 fL (ref 80.0–100.0)
Platelets: 204 10*3/uL (ref 150–400)
RBC: 3.94 MIL/uL (ref 3.87–5.11)
RDW: 14 % (ref 11.5–15.5)
WBC: 6 10*3/uL (ref 4.0–10.5)
nRBC: 0 % (ref 0.0–0.2)

## 2020-08-12 LAB — BASIC METABOLIC PANEL
Anion gap: 7 (ref 5–15)
BUN: 21 mg/dL (ref 8–23)
CO2: 27 mmol/L (ref 22–32)
Calcium: 9.5 mg/dL (ref 8.9–10.3)
Chloride: 105 mmol/L (ref 98–111)
Creatinine, Ser: 1.01 mg/dL — ABNORMAL HIGH (ref 0.44–1.00)
GFR, Estimated: 59 mL/min — ABNORMAL LOW (ref >60)
Glucose, Bld: 94 mg/dL (ref 70–99)
Potassium: 3.8 mmol/L (ref 3.5–5.1)
Sodium: 139 mmol/L (ref 135–145)

## 2020-08-12 LAB — TROPONIN I (HIGH SENSITIVITY)
Troponin I (High Sensitivity): 6 ng/L (ref <18)
Troponin I (High Sensitivity): 5 ng/L (ref <18)

## 2020-08-12 MED ORDER — IOHEXOL 300 MG/ML  SOLN
100.0000 mL | Freq: Once | INTRAMUSCULAR | Status: AC | PRN
  Administered 2020-08-12: 100 mL via INTRAVENOUS

## 2020-08-12 MED ORDER — LIDOCAINE VISCOUS HCL 2 % MT SOLN
15.0000 mL | Freq: Once | OROMUCOSAL | Status: AC
  Administered 2020-08-12: 15 mL via ORAL
  Filled 2020-08-12: qty 15

## 2020-08-12 MED ORDER — ALUM & MAG HYDROXIDE-SIMETH 200-200-20 MG/5ML PO SUSP
30.0000 mL | Freq: Once | ORAL | Status: AC
  Administered 2020-08-12: 30 mL via ORAL
  Filled 2020-08-12: qty 30

## 2020-08-12 NOTE — Discharge Instructions (Addendum)
Continue your current medications.  Follow-up with your cardiologist and primary care doctor as we discussed.  The radiologist did recommend an outpatient ultrasound of your liver to confirm the probably cyst noted on CT scan.

## 2020-08-12 NOTE — ED Triage Notes (Signed)
Pt was seen on 7/6 for same. Had return of mid chest pressure that radiates to bilateral chest and feels mild pain in her back. Denies sob. Airway intact, no acute distress is noted at this time.

## 2020-08-12 NOTE — ED Provider Notes (Addendum)
[facility] EMERGENCY DEPARTMENT Provider Note   CSN: [id number] Arrival date & time: 08/12/20  1141     History Chief Complaint  Patient presents with   Chest Pain    Anna [name] is a 75 y.o. female.   Chest Pain   Pt has been having pain in her chest this past week.   this am the pain started again on both sides of the chest, radiating to the back..  Patient describes the discomfort as a pressure sensation some diaphoresis.  No dyspnea.  Does not seem to be specifically related to activity.  Patient had a work-up in the ED on July 6.  She was given a GI cocktail during that visit noted some improvement.  Patient has continued on her home antacids.  She also had been taking Ultram for pain after recent fall but stopped taking it over the weekend.  Symptoms started again this morning which brought her to the ED.  Past Medical History:  Diagnosis Date   Arthritis    Breast cancer [facility])    age 14   Headache    Osteopenia    Vision abnormalities     Patient Active Problem List   Diagnosis Date Noted   Chronic right shoulder pain 03/07/2018   Chronic migraine 08/11/2017    Past Surgical History:  Procedure Laterality Date   BACK SURGERY     lumbar region   BUNIONECTOMY Bilateral    MASTECTOMY Left    at age 48   TRIGGER FINGER RELEASE Left    left thumb   TUBAL LIGATION       OB History   No obstetric history on file.     Family History  Problem Relation Age of Onset   Hyperlipidemia Mother    Hypertension Mother    Alzheimer's disease Father    Hypertension Sister    Hypertension Sister    Hypertension Sister    Hypertension Brother    Lung cancer Brother    Hypertension Sister    Hypertension Brother     Social History   Tobacco Use   Smoking status: Never   Smokeless tobacco: Never  Substance Use Topics   Alcohol use: Never   Drug use: Never    Home Medications Prior to Admission medications   Medication Sig Start  Date End Date Taking? Authorizing Provider  acetaminophen (TYLENOL) 325 MG tablet Take 650 mg by mouth every 6 (six) hours as needed.    [provider]  Calcium 600-200 MG-UNIT tablet Take 2 tablets by mouth daily.    [provider]  ferrous sulfate (SLOW FE) 160 (50 Fe) MG TBCR SR tablet Take 1 tablet by mouth daily.    [provider]  gabapentin (NEURONTIN) 100 MG capsule TAKE 1 CAPSULE BY MOUTH THREE TIMES DAILY 09/06/18   [name], PA-C  IBUPROFEN PO Take 800 mg tablet by mouth 1-3 times daily    [provider]  levothyroxine (SYNTHROID) 100 MCG tablet Take 100 mcg by mouth daily. 05/01/19   [provider]  lidocaine (LIDODERM) 5 % Place 1 patch onto the skin daily. Remove & Discard patch within 12 hours or as directed by MD 08/01/19   [name], PA-C  methocarbamol (ROBAXIN) 500 MG tablet Take 1 tablet (500 mg total) by mouth every 6 (six) hours as needed for muscle spasms (muscle spasm). 07/12/19   Persons, [name], PA  omeprazole (PRILOSEC) 20 MG capsule Take 1 capsule (  20 mg total) by mouth daily. 08/08/20   [name], PA-C  pravastatin (PRAVACHOL) 40 MG tablet Take 40 mg by mouth daily.  12/28/16   [provider]  SUMAtriptan (IMITREX) 25 MG tablet Take 1 tab at onset of migraine.  May repeat in 2 hrs, if needed.  Max dose: 2 tabs/day. This is a 30 day prescription. 10/03/18   [name], MD  tiZANidine (ZANAFLEX) 4 MG tablet Take 1 tablet (4 mg total) by mouth every 6 (six) hours as needed for muscle spasms. 04/04/18   [name], MD    Allergies    Sulfa antibiotics  Review of Systems   Review of Systems  Cardiovascular:  Positive for chest pain.  All other systems reviewed and are negative.  Physical Exam Updated Vital Signs BP 121/73   Pulse 65   Temp 97.7 F (36.5 C)   Resp 18   SpO2 98%   Physical Exam Vitals and nursing note reviewed.  Constitutional:      General: She is not in acute  distress.    Appearance: She is well-developed.  HENT:     Head: Normocephalic and atraumatic.     Right Ear: External ear normal.     Left Ear: External ear normal.  Eyes:     General: No scleral icterus.       Right eye: No discharge.        Left eye: No discharge.     Conjunctiva/sclera: Conjunctivae normal.  Neck:     Trachea: No tracheal deviation.  Cardiovascular:     Rate and Rhythm: Normal rate and regular rhythm.  Pulmonary:     Effort: Pulmonary effort is normal. No respiratory distress.     Breath sounds: Normal breath sounds. No stridor. No wheezing or rales.  Abdominal:     General: Bowel sounds are normal. There is no distension.     Palpations: Abdomen is soft.     Tenderness: There is no abdominal tenderness. There is no guarding or rebound.  Musculoskeletal:        General: No tenderness or deformity.     Cervical back: Neck supple.  Skin:    General: Skin is warm and dry.     Findings: No rash.  Neurological:     General: No focal deficit present.     Mental Status: She is alert.     Cranial Nerves: No cranial nerve deficit (no facial droop, extraocular movements intact, no slurred speech).     Sensory: No sensory deficit.     Motor: No abnormal muscle tone or seizure activity.     Coordination: Coordination normal.  Psychiatric:        Mood and Affect: Mood normal.    ED Results / Procedures / Treatments   Labs (all labs ordered are listed, but only abnormal results are displayed) Labs Reviewed  BASIC METABOLIC PANEL - Abnormal; Notable for the following components:      Result Value   Creatinine, Ser 1.01 (*)    GFR, Estimated 59 (*)    All other components within normal limits  CBC  TROPONIN I (HIGH SENSITIVITY)  TROPONIN I (HIGH SENSITIVITY)    EKG EKG Interpretation  Date/Time:  Monday August 12 2020 11:46:53 EDT Ventricular Rate:  69 PR Interval:  166 QRS Duration: 90 QT Interval:  368 QTC Calculation: 394 R Axis:   23 Text  Interpretation: Normal sinus rhythm Nonspecific ST and T wave abnormality , new since last tracing  Abnormal ECG Confirmed by Dorie Rank [phone]) on 08/12/2020 7:49:03 PM  Radiology DG Chest 2 View  Result Date: 08/12/2020 CLINICAL DATA:  Chest pain and pressure radiating to the back. EXAM: CHEST - 2 VIEW COMPARISON:  08/07/2020. FINDINGS: Trachea is midline. Heart is at the upper limits of normal in size, stable. Lungs are clear. No pleural fluid. Surgical clips in left axilla. Left mastectomy. Degenerative changes in the spine. IMPRESSION: No acute findings. Electronically Signed   By: Lorin Picket M.D.   On: 08/12/2020 12:40   CT Angio Chest PE W and/or Wo Contrast  Result Date: 08/12/2020 CLINICAL DATA:  Chest pain EXAM: CT ANGIOGRAPHY CHEST WITH CONTRAST TECHNIQUE: Multidetector CT imaging of the chest was performed using the standard protocol during bolus administration of intravenous contrast. Multiplanar CT image reconstructions and MIPs were obtained to evaluate the vascular anatomy. CONTRAST:  175mL OMNIPAQUE IOHEXOL 300 MG/ML  SOLN COMPARISON:  Chest x-ray from earlier in the same day. FINDINGS: Cardiovascular: Thoracic aorta and its branches are well visualized. No aneurysmal dilatation or dissection is noted. Pulmonary artery shows a normal branching pattern. No definitive filling defect is identified to suggest pulmonary embolism. No cardiac enlargement is seen. No coronary calcifications are noted. Mediastinum/Nodes: Thoracic inlet is within normal limits. No sizable hilar or mediastinal adenopathy is noted. The esophagus as visualized is within normal limits. Lungs/Pleura: Lungs are well aerated bilaterally. Mild dependent atelectatic changes are noted particularly in the left lower lobe. No focal infiltrate or sizable effusion is seen. No parenchymal nodules are noted. Upper Abdomen: Visualized upper abdomen is unremarkable with the exception of a rounded hypodensity in the upper aspect of  the liver measuring 1.7 cm in greatest dimension. Statistically this likely represents a cyst Musculoskeletal: Degenerative changes of the thoracic spine are seen. Left breast implant is noted consistent with the history of prior mastectomy. No acute rib abnormality is seen. Review of the MIP images confirms the above findings. IMPRESSION: No evidence of pulmonary emboli. Mild left basilar atelectasis. Rounded hypodensity in the dome of the liver as described likely representing a cyst. Nonemergent ultrasound could be performed for further evaluation. Electronically Signed   By: Inez Catalina M.D.   On: 08/12/2020 23:57    Procedures Procedures   Medications Ordered in ED Medications  alum & mag hydroxide-simeth (MAALOX/MYLANTA) 200-200-20 MG/5ML suspension 30 mL (30 mLs Oral Given 08/12/20 2241)    And  lidocaine (XYLOCAINE) 2 % viscous mouth solution 15 mL (15 mLs Oral Given 08/12/20 2241)  iohexol (OMNIPAQUE) 300 MG/ML solution 100 mL (100 mLs Intravenous Contrast Given 08/12/20 2350)    ED Course  I have reviewed the triage vital signs and the nursing notes.  Pertinent labs & imaging results that were available during my care of the patient were reviewed by me and considered in my medical decision making (see chart for details).    MDM Rules/Calculators/A&P                          Patient presented to ED with complaints of recurrent chest pain.  Initial ED work-up reassuring.  Serial troponins are normal.  No pneumonia on x-ray.  With her recurrent pain and the pain rating to the back will perform CT angiogram to rule out any aneurysm or pulmonary embolism.  If negative anticipate the patient can follow-up with her cardiologist as an outpatient.    CT scan negative for acute finding.  Incidental liver finding discussed with  patient and family. Final Clinical Impression(s) / ED Diagnoses Final diagnoses:  Chest pain, unspecified type     Dorie Rank, MD 08/12/20 2340    Dorie Rank,  MD 08/13/20 [phone]

## 2020-08-15 ENCOUNTER — Other Ambulatory Visit: Payer: Self-pay

## 2020-08-15 ENCOUNTER — Encounter: Payer: Self-pay | Admitting: Orthopaedic Surgery

## 2020-08-15 ENCOUNTER — Ambulatory Visit: Payer: Medicare Other | Admitting: Orthopaedic Surgery

## 2020-08-15 DIAGNOSIS — M1611 Unilateral primary osteoarthritis, right hip: Secondary | ICD-10-CM

## 2020-08-15 DIAGNOSIS — M542 Cervicalgia: Secondary | ICD-10-CM | POA: Diagnosis not present

## 2020-08-15 DIAGNOSIS — G8929 Other chronic pain: Secondary | ICD-10-CM

## 2020-08-15 DIAGNOSIS — M5010 Cervical disc disorder with radiculopathy, unspecified cervical region: Secondary | ICD-10-CM

## 2020-08-15 DIAGNOSIS — M25561 Pain in right knee: Secondary | ICD-10-CM

## 2020-08-15 DIAGNOSIS — M1711 Unilateral primary osteoarthritis, right knee: Secondary | ICD-10-CM | POA: Diagnosis not present

## 2020-08-15 NOTE — Progress Notes (Signed)
The patient is a 75 year old female well-known to [place].  She ambulates with a cane.  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  She recently had an intra-articular injection of a steroid in her right hip under ultrasound by Dr. Junius Roads and that has helped her quite a bit.  She has been to the emergency room twice recently for chest pain.  They say it may be gall stone related or liver related.  She has been having some neck pain which is chronic as well and some back pain.  She understands at this point though I think I would recommend for her neck and back would be physical therapy.  She has had therapy before and states that she is able to do home exercises but would prefer not to go back to any more therapy.  Her right hip has some pain with internal and external rotation but no blocks to rotation but it is painful.  He has known well-documented severe osteoarthritis.  Her right knee also has mainly medial and lateral joint line tenderness just below the subchondral area and is painful no effusion but good range of motion.  Her neck is stiff but there is no radicular symptoms going down either arm.  Again from my standpoint only thing I could recommend at this point with the Tylenol, Aleve, topical anti-inflammatories, Biofreeze and other modalities including outpatient physical therapy.  Follow-up will be as needed.

## 2020-08-16 DIAGNOSIS — M1990 Unspecified osteoarthritis, unspecified site: Secondary | ICD-10-CM | POA: Insufficient documentation

## 2020-08-16 DIAGNOSIS — M8580 Other specified disorders of bone density and structure, unspecified site: Secondary | ICD-10-CM | POA: Insufficient documentation

## 2020-08-16 DIAGNOSIS — R519 Headache, unspecified: Secondary | ICD-10-CM | POA: Insufficient documentation

## 2020-08-16 DIAGNOSIS — H539 Unspecified visual disturbance: Secondary | ICD-10-CM | POA: Insufficient documentation

## 2020-08-16 DIAGNOSIS — C50919 Malignant neoplasm of unspecified site of unspecified female breast: Secondary | ICD-10-CM | POA: Insufficient documentation

## 2020-08-21 DIAGNOSIS — K7689 Other specified diseases of liver: Secondary | ICD-10-CM | POA: Diagnosis not present

## 2020-08-21 DIAGNOSIS — R0789 Other chest pain: Secondary | ICD-10-CM | POA: Diagnosis not present

## 2020-08-22 ENCOUNTER — Other Ambulatory Visit: Payer: Self-pay | Admitting: Internal Medicine

## 2020-08-22 DIAGNOSIS — K7689 Other specified diseases of liver: Secondary | ICD-10-CM

## 2020-08-29 ENCOUNTER — Encounter: Payer: Self-pay | Admitting: Cardiology

## 2020-08-29 ENCOUNTER — Ambulatory Visit: Payer: Medicare Other | Admitting: Cardiology

## 2020-08-29 ENCOUNTER — Other Ambulatory Visit: Payer: Self-pay

## 2020-08-29 VITALS — BP 116/68 | HR 86 | Ht 66.0 in | Wt 196.1 lb

## 2020-08-29 DIAGNOSIS — E782 Mixed hyperlipidemia: Secondary | ICD-10-CM | POA: Diagnosis not present

## 2020-08-29 DIAGNOSIS — R079 Chest pain, unspecified: Secondary | ICD-10-CM

## 2020-08-29 DIAGNOSIS — R072 Precordial pain: Secondary | ICD-10-CM

## 2020-08-29 DIAGNOSIS — R011 Cardiac murmur, unspecified: Secondary | ICD-10-CM | POA: Diagnosis not present

## 2020-08-29 NOTE — Progress Notes (Signed)
Cardiology Office Note:    Date:  08/29/2020   ID:  Anna [name], DOB [birth year], MRN [id number]  PCP:  [name], MD  Cardiologist:  Jenean Lindau, MD   Referring MD: [name], MD    ASSESSMENT:    1. Chest pain of uncertain etiology   2. Cardiac murmur   3. Mixed dyslipidemia    PLAN:    In order of problems listed above:  Chest pain: Primary prevention stressed with the patient.  Importance of compliance with diet medication stressed and she vocalized understanding.  Her chest pain is atypical.  Recent CT scan revealed no calcification in the coronary arteries.  For her satisfaction and reassurance I will do a Lexiscan sestamibi as she has risk factors for coronary artery disease including age and high lipids.  She is agreeable. Cardiac murmur: Echocardiogram will be done to assess murmur heard on auscultation. Mixed dyslipidemia: Diet emphasized.  Lipids followed by primary care physician. Patient and her son had multiple questions which were answered to their satisfaction.Patient will be seen in follow-up appointment in 6 months or earlier if the patient has any concerns    Medication Adjustments/Labs and Tests Ordered: Current medicines are reviewed at length with the patient today.  Concerns regarding medicines are outlined above.  No orders of the defined types were placed in this encounter.  No orders of the defined types were placed in this encounter.    History of Present Illness:    Anna [name] is a 75 y.o. female who is being seen today for the evaluation of chest pain at the request of [name], MD. patient's son was on the phone and participated in the conversation.  She has history of mixed dyslipidemia and she is on statin therapy managed by primary care.  She went to the emergency room with chest pain.  No orthopnea or PND.  This did not occur with exertion.  It is stabbing-like nature and no radiation to the neck or to the arms.  She  ambulates with a walker because of hip issues.  At the time of my evaluation, the patient is alert awake oriented and in no distress.  Past Medical History:  Diagnosis Date   Arthritis    Breast cancer [facility])    age 32   Headache    Osteopenia    Vision abnormalities     Past Surgical History:  Procedure Laterality Date   BACK SURGERY     lumbar region   BUNIONECTOMY Bilateral    MASTECTOMY Left    at age 84   TRIGGER FINGER RELEASE Left    left thumb   TUBAL LIGATION      Current Medications: Current Meds  Medication Sig   acetaminophen (TYLENOL) 325 MG tablet Take 650 mg by mouth every 6 (six) hours as needed for mild pain.   Calcium 600-200 MG-UNIT tablet Take 2 tablets by mouth daily.   ferrous sulfate (SLOW FE) 160 (50 Fe) MG TBCR SR tablet Take 1 tablet by mouth daily.   gabapentin (NEURONTIN) 100 MG capsule TAKE 1 CAPSULE BY MOUTH THREE TIMES DAILY   IBUPROFEN PO Take 800 mg tablet by mouth 1-3 times daily   levothyroxine (SYNTHROID) 100 MCG tablet Take 100 mcg by mouth daily.   lidocaine (LIDODERM) 5 % Place 1 patch onto the skin daily. Remove & Discard patch within 12 hours or as directed by MD   methocarbamol (ROBAXIN) 500 MG tablet Take 1 tablet (500  mg total) by mouth every 6 (six) hours as needed for muscle spasms (muscle spasm).   omeprazole (PRILOSEC) 20 MG capsule Take 1 capsule (20 mg total) by mouth daily.   pravastatin (PRAVACHOL) 40 MG tablet Take 40 mg by mouth daily.    SUMAtriptan (IMITREX) 25 MG tablet Take 1 tab at onset of migraine.  May repeat in 2 hrs, if needed.  Max dose: 2 tabs/day. This is a 30 day prescription.   tiZANidine (ZANAFLEX) 4 MG tablet Take 1 tablet (4 mg total) by mouth every 6 (six) hours as needed for muscle spasms.     Allergies:   Septra [sulfamethoxazole-trimethoprim] and Sulfa antibiotics   Social History   Socioeconomic History   Marital status: Married    Spouse name: Not on file   Number of children: Not on file    Years of education: Not on file   Highest education level: Not on file  Occupational History   Not on file  Tobacco Use   Smoking status: Never   Smokeless tobacco: Never  Substance and Sexual Activity   Alcohol use: Never   Drug use: Never   Sexual activity: Not Currently  Other Topics Concern   Not on file  Social History Narrative   Not on file   Social Determinants of Health   Financial Resource Strain: Not on file  Food Insecurity: Not on file  Transportation Needs: Not on file  Physical Activity: Not on file  Stress: Not on file  Social Connections: Not on file     Family History: The patient's family history includes Alzheimer's disease in her father; Hyperlipidemia in her mother; Hypertension in her brother, brother, mother, sister, sister, sister, and sister; Lung cancer in her brother.  ROS:   Please see the history of present illness.    All other systems reviewed and are negative.  EKGs/Labs/Other Studies Reviewed:    The following studies were reviewed today: EKG reveals sinus rhythm and nonspecific ST-T changes   Recent Labs: 08/07/2020: ALT 16 08/12/2020: BUN 21; Creatinine, Ser 1.01; Hemoglobin 12.1; Platelets 204; Potassium 3.8; Sodium 139  Recent Lipid Panel No results found for: CHOL, TRIG, HDL, CHOLHDL, VLDL, LDLCALC, LDLDIRECT  Physical Exam:    VS:  BP 116/68 (BP Location: Right Arm, Patient Position: Sitting, Cuff Size: Large)   Pulse 86   Ht '5\' 6"'$  (1.676 m)   Wt 196 lb 1.3 oz (88.9 kg)   SpO2 98%   BMI 31.65 kg/m     Wt Readings from Last 3 Encounters:  08/29/20 196 lb 1.3 oz (88.9 kg)  08/07/20 198 lb (89.8 kg)  07/23/20 199 lb (90.3 kg)     GEN: Patient is in no acute distress HEENT: Normal NECK: No JVD; No carotid bruits LYMPHATICS: No lymphadenopathy CARDIAC: S1 S2 regular, 2/6 systolic murmur at the apex. RESPIRATORY:  Clear to auscultation without rales, wheezing or rhonchi  ABDOMEN: Soft, non-tender,  non-distended MUSCULOSKELETAL:  No edema; No deformity  SKIN: Warm and dry NEUROLOGIC:  Alert and oriented x 3 PSYCHIATRIC:  Normal affect    Signed, Jenean Lindau, MD  08/29/2020 1:20 PM    [facility]

## 2020-08-29 NOTE — Patient Instructions (Signed)
Medication Instructions:  No medication changes. *If you need a refill on your cardiac medications before your next appointment, please call your pharmacy*   Lab Work: None ordered If you have labs (blood work) drawn today and your tests are completely normal, you will receive your results only by: MyChart Message (if you have MyChart) OR A paper copy in the mail If you have any lab test that is abnormal or we need to change your treatment, we will call you to review the results.   Testing/Procedures: Your physician has requested that you have a lexiscan myoview. For further information please visit [email or web address]. Please follow instruction sheet, as given.  The test will take approximately 3 to 4 hours to complete; you may bring reading material.  If someone comes with you to your appointment, they will need to remain in the main lobby due to limited space in the testing area.   How to prepare for your Myocardial Perfusion Test: Do not eat or drink 3 hours prior to your test, except you may have water. Do not consume products containing caffeine (regular or decaffeinated) 12 hours prior to your test. (ex: coffee, chocolate, sodas, tea). Do bring a list of your current medications with you.  If not listed below, you may take your medications as normal. Do wear comfortable clothes (no dresses or overalls) and walking shoes, tennis shoes preferred (No heels or open toe shoes are allowed). Do NOT wear cologne, perfume, aftershave, or lotions (deodorant is allowed). If these instructions are not followed, your test will have to be rescheduled.  Your physician has requested that you have an echocardiogram. Echocardiography is a painless test that uses sound waves to create images of your heart. It provides your doctor with information about the size and shape of your heart and how well your heart's chambers and valves are working. This procedure takes approximately one hour. There are no  restrictions for this procedure.   Follow-Up: At [facility], you and your health needs are our priority.  As part of our continuing mission to provide you with exceptional heart care, we have created designated Provider Care Teams.  These Care Teams include your primary Cardiologist (physician) and Advanced Practice Providers (APPs -  Physician Assistants and Nurse Practitioners) who all work together to provide you with the care you need, when you need it.  We recommend signing up for the patient portal called "MyChart".  Sign up information is provided on this After Visit Summary.  MyChart is used to connect with patients for Virtual Visits (Telemedicine).  Patients are able to view lab/test results, encounter notes, upcoming appointments, etc.  Non-urgent messages can be sent to your provider as well.   To learn more about what you can do with MyChart, go to [email or web address].    Your next appointment:   6 month(s)  The format for your next appointment:   In Person  Provider:   Rajan Revankar, MD   Other Instructions Cardiac Nuclear Scan A cardiac nuclear scan is a test that is done to check the flow of blood to your heart. It is done when you are resting and when you are exercising. The test looks for problems such as: Not enough blood reaching a portion of the heart. The heart muscle not working as it should. You may need this test if: You have heart disease. You have had lab results that are not normal. You have had heart surgery or a balloon procedure to open up blocked arteries (  angioplasty). You have chest pain. You have shortness of breath. In this test, a special dye (tracer) is put into your bloodstream. The tracer will travel to your heart. A camera will then take pictures of your heart to see how the tracer moves through your heart. This test is usually done at a hospital and takes 2-4 hours. Tell a doctor about: Any allergies you have. All medicines you are  taking, including vitamins, herbs, eye drops, creams, and over-the-counter medicines. Any problems you or family members have had with anesthetic medicines. Any blood disorders you have. Any surgeries you have had. Any medical conditions you have. Whether you are pregnant or may be pregnant. What are the risks? Generally, this is a safe test. However, problems may occur, such as: Serious chest pain and heart attack. This is only a risk if the stress portion of the test is done. Rapid heartbeat. A feeling of warmth in your chest. This feeling usually does not last long. Allergic reaction to the tracer. What happens before the test? Ask your doctor about changing or stopping your normal medicines. This is important. Follow instructions from your doctor about what you cannot eat or drink. Remove your jewelry on the day of the test. What happens during the test? An IV tube will be inserted into one of your veins. Your doctor will give you a small amount of tracer through the IV tube. You will wait for 20-40 minutes while the tracer moves through your bloodstream. Your heart will be monitored with an electrocardiogram (ECG). You will lie down on an exam table. Pictures of your heart will be taken for about 15-20 minutes. You may also have a stress test. For this test, one of these things may be done: You will be asked to exercise on a treadmill or a stationary bike. You will be given medicines that will make your heart work harder. This is done if you are unable to exercise. When blood flow to your heart has peaked, a tracer will again be given through the IV tube. After 20-40 minutes, you will get back on the exam table. More pictures will be taken of your heart. Depending on the tracer that is used, more pictures may need to be taken 3-4 hours later. Your IV tube will be removed when the test is over. The test may vary among doctors and hospitals. What happens after the test? Ask your  doctor: Whether you can return to your normal schedule, including diet, activities, and medicines. Whether you should drink more fluids. This will help to remove the tracer from your body. Drink enough fluid to keep your pee (urine) pale yellow. Ask your doctor, or the department that is doing the test: When will my results be ready? How will I get my results? Summary A cardiac nuclear scan is a test that is done to check the flow of blood to your heart. Tell your doctor whether you are pregnant or may be pregnant. Before the test, ask your doctor about changing or stopping your normal medicines. This is important. Ask your doctor whether you can return to your normal activities. You may be asked to drink more fluids. This information is not intended to replace advice given to you by your health care provider. Make sure you discuss any questions you have with your health care provider. Document Revised: 05/11/2018 Document Reviewed: 07/05/2017 Elsevier Patient Education  2021 Elsevier Inc.    Echocardiogram An echocardiogram is a test that uses sound waves (ultrasound)   to produce images of the heart. Images from an echocardiogram can provide important information about: Heart size and shape. The size and thickness and movement of your heart's walls. Heart muscle function and strength. Heart valve function or if you have stenosis. Stenosis is when the heart valves are too narrow. If blood is flowing backward through the heart valves (regurgitation). A tumor or infectious growth around the heart valves. Areas of heart muscle that are not working well because of poor blood flow or injury from a heart attack. Aneurysm detection. An aneurysm is a weak or damaged part of an artery wall. The wall bulges out from the normal force of blood pumping through the body. Tell a health care provider about: Any allergies you have. All medicines you are taking, including vitamins, herbs, eye drops,  creams, and over-the-counter medicines. Any blood disorders you have. Any surgeries you have had. Any medical conditions you have. Whether you are pregnant or may be pregnant. What are the risks? Generally, this is a safe test. However, problems may occur, including an allergic reaction to dye (contrast) that may be used during the test. What happens before the test? No specific preparation is needed. You may eat and drink normally. What happens during the test? You will take off your clothes from the waist up and put on a hospital gown. Electrodes or electrocardiogram (ECG)patches may be placed on your chest. The electrodes or patches are then connected to a device that monitors your heart rate and rhythm. You will lie down on a table for an ultrasound exam. A gel will be applied to your chest to help sound waves pass through your skin. A handheld device, called a transducer, will be pressed against your chest and moved over your heart. The transducer produces sound waves that travel to your heart and bounce back (or "echo" back) to the transducer. These sound waves will be captured in real-time and changed into images of your heart that can be viewed on a video monitor. The images will be recorded on a computer and reviewed by your health care provider. You may be asked to change positions or hold your breath for a short time. This makes it easier to get different views or better views of your heart. In some cases, you may receive contrast through an IV in one of your veins. This can improve the quality of the pictures from your heart. The procedure may vary among health care providers and hospitals.    What can I expect after the test? You may return to your normal, everyday life, including diet, activities, and medicines, unless your health care provider tells you not to do that. Follow these instructions at home: It is up to you to get the results of your test. Ask your health care  provider, or the department that is doing the test, when your results will be ready. Keep all follow-up visits. This is important. Summary An echocardiogram is a test that uses sound waves (ultrasound) to produce images of the heart. Images from an echocardiogram can provide important information about the size and shape of your heart, heart muscle function, heart valve function, and other possible heart problems. You do not need to do anything to prepare before this test. You may eat and drink normally. After the echocardiogram is completed, you may return to your normal, everyday life, unless your health care provider tells you not to do that. This information is not intended to replace advice given to you by   your health care provider. Make sure you discuss any questions you have with your health care provider. Document Revised: 09/12/2019 Document Reviewed: 09/12/2019 Elsevier Patient Education  2021 Elsevier Inc.  

## 2020-08-30 ENCOUNTER — Telehealth (HOSPITAL_COMMUNITY): Payer: Self-pay | Admitting: *Deleted

## 2020-08-30 ENCOUNTER — Telehealth: Payer: Self-pay | Admitting: Cardiology

## 2020-08-30 NOTE — Telephone Encounter (Signed)
Close encounter 

## 2020-08-30 NOTE — Telephone Encounter (Signed)
Pt is requesting a Provider Switch from Dr. Geraldo Pitter to Dr. Johnsie Cancel

## 2020-09-03 ENCOUNTER — Other Ambulatory Visit: Payer: Self-pay

## 2020-09-03 ENCOUNTER — Ambulatory Visit (HOSPITAL_COMMUNITY)
Admission: RE | Admit: 2020-09-03 | Discharge: 2020-09-03 | Disposition: A | Discharge status: Home / Self Care | Payer: Medicare Other | Source: Ambulatory Visit | Attending: Cardiovascular Disease | Admitting: Cardiovascular Disease

## 2020-09-03 DIAGNOSIS — R072 Precordial pain: Secondary | ICD-10-CM | POA: Diagnosis not present

## 2020-09-03 DIAGNOSIS — R079 Chest pain, unspecified: Secondary | ICD-10-CM | POA: Insufficient documentation

## 2020-09-03 LAB — MYOCARDIAL PERFUSION IMAGING
LV dias vol: 98 mL (ref 46–106)
LV sys vol: 42 mL
Peak HR: 102 {beats}/min
Rest HR: 61 {beats}/min
SDS: 2
SRS: 2
SSS: 4
TID: 1.21

## 2020-09-03 MED ORDER — REGADENOSON 0.4 MG/5ML IV SOLN
0.4000 mg | Freq: Once | INTRAVENOUS | Status: AC
  Administered 2020-09-03: 0.4 mg via INTRAVENOUS

## 2020-09-03 MED ORDER — TECHNETIUM TC 99M TETROFOSMIN IV KIT
10.9000 | PACK | Freq: Once | INTRAVENOUS | Status: AC | PRN
  Administered 2020-09-03: 10.9 via INTRAVENOUS
  Filled 2020-09-03: qty 11

## 2020-09-03 MED ORDER — AMINOPHYLLINE 25 MG/ML IV SOLN
75.0000 mg | Freq: Once | INTRAVENOUS | Status: AC
  Administered 2020-09-03: 75 mg via INTRAVENOUS

## 2020-09-03 MED ORDER — TECHNETIUM TC 99M TETROFOSMIN IV KIT
31.5000 | PACK | Freq: Once | INTRAVENOUS | Status: AC | PRN
  Administered 2020-09-03: 31.5 via INTRAVENOUS
  Filled 2020-09-03: qty 32

## 2020-09-05 ENCOUNTER — Ambulatory Visit
Admission: RE | Admit: 2020-09-05 | Discharge: 2020-09-05 | Disposition: A | Discharge status: Home / Self Care | Payer: Medicare Other | Source: Ambulatory Visit | Attending: Internal Medicine | Admitting: Internal Medicine

## 2020-09-05 DIAGNOSIS — K7689 Other specified diseases of liver: Secondary | ICD-10-CM | POA: Diagnosis not present

## 2020-09-05 DIAGNOSIS — N281 Cyst of kidney, acquired: Secondary | ICD-10-CM | POA: Diagnosis not present

## 2020-09-06 ENCOUNTER — Telehealth: Payer: Self-pay

## 2020-09-06 DIAGNOSIS — R0789 Other chest pain: Secondary | ICD-10-CM | POA: Diagnosis not present

## 2020-09-06 DIAGNOSIS — K7689 Other specified diseases of liver: Secondary | ICD-10-CM | POA: Diagnosis not present

## 2020-09-06 NOTE — Telephone Encounter (Signed)
Left message on patients voicemail to please return our call.   Letter mailed at this time.

## 2020-09-06 NOTE — Telephone Encounter (Signed)
Spoke with patient regarding results and recommendation.  Patient verbalizes understanding and is agreeable to plan of care. Advised patient to call back with any issues or concerns.  

## 2020-09-06 NOTE — Telephone Encounter (Signed)
Pt is returning call.  

## 2020-09-06 NOTE — Telephone Encounter (Signed)
-----   Message from Jenean Lindau, MD sent at 09/04/2020 12:46 PM EDT ----- The results of the study is unremarkable. Please inform patient. I will discuss in detail at next appointment. Cc  primary care/referring physician Jenean Lindau, MD 09/04/2020 12:46 PM

## 2020-09-11 ENCOUNTER — Other Ambulatory Visit: Payer: Self-pay

## 2020-09-11 ENCOUNTER — Ambulatory Visit (HOSPITAL_COMMUNITY): Payer: Medicare Other | Attending: Internal Medicine

## 2020-09-11 DIAGNOSIS — R011 Cardiac murmur, unspecified: Secondary | ICD-10-CM | POA: Diagnosis not present

## 2020-09-11 LAB — ECHOCARDIOGRAM COMPLETE
Area-P 1/2: 3.03 cm2
S' Lateral: 2.8 cm

## 2020-09-18 DIAGNOSIS — L821 Other seborrheic keratosis: Secondary | ICD-10-CM | POA: Diagnosis not present

## 2020-09-18 DIAGNOSIS — L820 Inflamed seborrheic keratosis: Secondary | ICD-10-CM | POA: Diagnosis not present

## 2020-10-06 ENCOUNTER — Ambulatory Visit
Admission: EM | Admit: 2020-10-06 | Discharge: 2020-10-06 | Disposition: A | Discharge status: Home / Self Care | Payer: Medicare Other | Attending: Urgent Care | Admitting: Urgent Care

## 2020-10-06 ENCOUNTER — Other Ambulatory Visit: Payer: Self-pay

## 2020-10-06 ENCOUNTER — Encounter: Payer: Self-pay | Admitting: Urgent Care

## 2020-10-06 DIAGNOSIS — M546 Pain in thoracic spine: Secondary | ICD-10-CM

## 2020-10-06 MED ORDER — TIZANIDINE HCL 4 MG PO TABS: 4.0000 mg | ORAL_TABLET | Freq: Every day | ORAL | 1 refills | Status: DC

## 2020-10-06 NOTE — ED Provider Notes (Signed)
[place]   MRN: [id number] DOB: July 28, 1945  Subjective:   Anna [name] is a 75 y.o. female presenting for 3-day history of acute onset moderate right-sided thoracic back pain that extends laterally.  Patient states that she was seen in laying in certain positions increase the pain.  Has used tramadol once, heating pad and Robaxin.  Denies fall, trauma, rash, weakness, numbness or tingling, bruising, cough, chest pain, shortness of breath, fevers.  No current facility-administered medications for this encounter.  Current Outpatient Medications:    acetaminophen (TYLENOL) 325 MG tablet, Take 650 mg by mouth every 6 (six) hours as needed for mild pain., Disp: , Rfl:    Calcium 600-200 MG-UNIT tablet, Take 2 tablets by mouth daily., Disp: , Rfl:    ferrous sulfate (SLOW FE) 160 (50 Fe) MG TBCR SR tablet, Take 1 tablet by mouth daily., Disp: , Rfl:    gabapentin (NEURONTIN) 100 MG capsule, TAKE 1 CAPSULE BY MOUTH THREE TIMES DAILY, Disp: 60 capsule, Rfl: 0   IBUPROFEN PO, Take 800 mg tablet by mouth 1-3 times daily, Disp: , Rfl:    levothyroxine (SYNTHROID) 100 MCG tablet, Take 100 mcg by mouth daily., Disp: , Rfl:    lidocaine (LIDODERM) 5 %, Place 1 patch onto the skin daily. Remove & Discard patch within 12 hours or as directed by MD, Disp: 30 patch, Rfl: 0   methocarbamol (ROBAXIN) 500 MG tablet, Take 1 tablet (500 mg total) by mouth every 6 (six) hours as needed for muscle spasms (muscle spasm)., Disp: 30 tablet, Rfl: 0   omeprazole (PRILOSEC) 20 MG capsule, Take 1 capsule (20 mg total) by mouth daily., Disp: 30 capsule, Rfl: 0   pravastatin (PRAVACHOL) 40 MG tablet, Take 40 mg by mouth daily. , Disp: , Rfl:    SUMAtriptan (IMITREX) 25 MG tablet, Take 1 tab at onset of migraine.  May repeat in 2 hrs, if needed.  Max dose: 2 tabs/day. This is a 30 day prescription., Disp: 12 tablet, Rfl: 3   tiZANidine (ZANAFLEX) 4 MG tablet, Take 1 tablet (4 mg total) by mouth every 6 (six)  hours as needed for muscle spasms., Disp: 30 tablet, Rfl: 1   Allergies  Allergen Reactions   Septra [Sulfamethoxazole-Trimethoprim]     Other reaction(s): itching   Sulfa Antibiotics     Past Medical History:  Diagnosis Date   Arthritis    Breast cancer ([place])    age 53   Headache    Osteopenia    Vision abnormalities      Past Surgical History:  Procedure Laterality Date   BACK SURGERY     lumbar region   BUNIONECTOMY Bilateral    MASTECTOMY Left    at age 64   TRIGGER FINGER RELEASE Left    left thumb   TUBAL LIGATION      Family History  Problem Relation Age of Onset   Hyperlipidemia Mother    Hypertension Mother    Alzheimer's disease Father    Hypertension Sister    Hypertension Sister    Hypertension Sister    Hypertension Brother    Lung cancer Brother    Hypertension Sister    Hypertension Brother     Social History   Tobacco Use   Smoking status: Never   Smokeless tobacco: Never  Substance Use Topics   Alcohol use: Never   Drug use: Never    ROS   Objective:   Vitals: BP 136/75 (BP Location: Right Arm)  Pulse 71   Temp 98.1 F (36.7 C) (Oral)   Resp 18   SpO2 98%   Physical Exam Constitutional:      General: She is not in acute distress.    Appearance: Normal appearance. She is well-developed. She is not ill-appearing, toxic-appearing or diaphoretic.  HENT:     Head: Normocephalic and atraumatic.     Nose: Nose normal.     Mouth/Throat:     Mouth: Mucous membranes are moist.     Pharynx: Oropharynx is clear.  Eyes:     General: No scleral icterus.    Extraocular Movements: Extraocular movements intact.     Pupils: Pupils are equal, round, and reactive to light.  Cardiovascular:     Rate and Rhythm: Normal rate and regular rhythm.     Pulses: Normal pulses.     Heart sounds: Normal heart sounds. No murmur heard.   No friction rub. No gallop.  Pulmonary:     Effort: Pulmonary effort is normal. No respiratory distress.      Breath sounds: Normal breath sounds. No stridor. No wheezing, rhonchi or rales.  Chest:     Chest wall: No tenderness.  Musculoskeletal:     Thoracic back: Tenderness (over area outlined) present. No swelling, edema, deformity, signs of trauma, lacerations, spasms or bony tenderness. Normal range of motion. No scoliosis.       Back:  Skin:    General: Skin is warm and dry.     Findings: No rash.  Neurological:     General: No focal deficit present.     Mental Status: She is alert and oriented to person, place, and time.  Psychiatric:        Mood and Affect: Mood normal.        Behavior: Behavior normal.        Thought Content: Thought content normal.      Assessment and Plan :   PDMP not reviewed this encounter.  1. Acute right-sided thoracic back pain     Patient has a clear cardiopulmonary exam, lack of trauma and therefore will defer imaging.  Low suspicion for fracture.  Will manage conservatively with Tylenol, tramadol for breakthrough pain and tizanidine for musculoskeletal pain. Counseled patient on potential for adverse effects with medications prescribed/recommended today, ER and return-to-clinic precautions discussed, patient verbalized understanding.    Jaynee Eagles, PA-C 10/06/20 1402

## 2020-10-06 NOTE — ED Triage Notes (Signed)
Pt sts right shoulder blade area pain worse with movement and positioning x 3 days

## 2020-10-06 NOTE — Discharge Instructions (Addendum)
Please just use Tylenol at a dose of '500mg'$ -'650mg'$  once every 6 hours as needed for your back ache, thoracic back pain. Use your tramadol for breakthrough pain. Do not use any nonsteroidal anti-inflammatories (NSAIDs) like ibuprofen, Motrin, naproxen, Aleve, etc. which are all available over-the-counter.

## 2020-10-09 ENCOUNTER — Telehealth: Payer: Self-pay | Admitting: Orthopaedic Surgery

## 2020-10-09 ENCOUNTER — Ambulatory Visit: Payer: Medicare Other | Admitting: Physician Assistant

## 2020-10-09 ENCOUNTER — Other Ambulatory Visit: Payer: Self-pay

## 2020-10-09 ENCOUNTER — Encounter: Payer: Self-pay | Admitting: Physician Assistant

## 2020-10-09 DIAGNOSIS — M542 Cervicalgia: Secondary | ICD-10-CM

## 2020-10-09 MED ORDER — METHYLPREDNISOLONE 4 MG PO TABS: ORAL_TABLET | ORAL | 0 refills | Status: DC

## 2020-10-09 NOTE — Telephone Encounter (Signed)
I talked to the pt. She is coming in now. Artis Delay said he would see her early

## 2020-10-09 NOTE — Progress Notes (Signed)
Office Visit Note   Patient: Anna [name]           Date of Birth: 1945/02/08           MRN: [id number] Visit Date: 10/09/2020              Requested by: [name], MD [address] [place],  [place] [address] PCP: [name], MD   Assessment & Plan: Visit Diagnoses:  1. Cervicalgia     Plan: Recommend at this point time for the acute pain trial of prednisone physical therapy.  Ultimately she may need referral to neurosurgery given her past MRI and recurrence of the radicular symptoms down the right arm.  Discussed with her not taking any NSAIDs while on the Medrol.  She will follow-up with [place] in 4 weeks.  Prescription for physical therapy was given for range of motion cervical spine modalities and home exercise program.  Follow-Up Instructions: Return in about 4 weeks (around 11/06/2020).   Orders:  No orders of the defined types were placed in this encounter.  Meds ordered this encounter  Medications   methylPREDNISolone (MEDROL) 4 MG tablet    Sig: Take as directed    Dispense:  21 tablet    Refill:  0      Procedures: No procedures performed   Clinical Data: No additional findings.   Subjective: Chief Complaint  Patient presents with   Right Shoulder - Pain    HPI Anna [name] returns today due to right shoulder and radicular symptoms down her right arm.  She states she was doing well until this past Friday when she developed pain under her right shoulder blade which she felt was gas-like.  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  She is having pain that is very similar to what she had last August down her right arm.  At that time she did undergo an MRI of her cervical spine which showed mild spinal stenosis at C3-C4 and C6-C7.  Moderate to severe neuroforaminal stenosis on the left at C5 bilaterally at C6 and bilaterally at C8.  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  Patient is nondiabetic. She notes that the pain does radiate down the right arm with numbness and tingling and affects the whole right hand.  She is having no symptoms on the left.  She notes that she has an upcoming trip tomorrow until Monday and that this may "ruin her trip".  She was seen in the ER recently was given Zanaflex and tramadol which she has.  Review of Systems Denies any fevers, chills, recent infections or vaccines.  Objective: Vital Signs: There were no vitals taken for this visit.  Physical Exam General: Well-developed well-nourished female in no acute distress Psych: Alert and oriented x3 Vascular: Radial pulses are 2+ bilaterally equal symmetric. Ortho Exam Upper extremities 5 out of 5 strength throughout against resistance.  She has excellent range of motion of the shoulders without pain.  Tenderness along the medial border of the right scapula and up into the right trapezius region.  Cervical spine with limited flexion and extension with radicular symptoms with these motions.  Spurling's is negative. Bilateral hands sensation grossly  intact throughout with good light touch.  Full motor bilateral hands.  Specialty Comments:  No specialty comments available.  Imaging: No results found.   PMFS History: Patient Active Problem List   Diagnosis Date Noted   Chest pain of uncertain etiology 0000000   Cardiac murmur 08/29/2020   Mixed dyslipidemia 08/29/2020   Arthritis 08/16/2020   Breast cancer ([place]) 08/16/2020   Headache 08/16/2020   Osteopenia 08/16/2020   Vision abnormalities 08/16/2020   Chronic right shoulder pain 03/07/2018   Chronic migraine 08/11/2017   Past Medical History:  Diagnosis Date   Arthritis    Breast cancer  [facility])    age 75   Headache    Osteopenia    Vision abnormalities     Family History  Problem Relation Age of Onset   Hyperlipidemia Mother    Hypertension Mother    Alzheimer's disease Father    Hypertension Sister    Hypertension Sister    Hypertension Sister    Hypertension Brother    Lung cancer Brother    Hypertension Sister    Hypertension Brother     Past Surgical History:  Procedure Laterality Date   BACK SURGERY     lumbar region   BUNIONECTOMY Bilateral    MASTECTOMY Left    at age 75   TRIGGER FINGER RELEASE Left    left thumb   TUBAL LIGATION     Social History   Occupational History   Not on file  Tobacco Use   Smoking status: Never   Smokeless tobacco: Never  Substance and Sexual Activity   Alcohol use: Never   Drug use: Never   Sexual activity: Not Currently

## 2020-10-09 NOTE — Telephone Encounter (Signed)
Pt called requesting a call back from nurse about her pain. Pt has an appt at 3 pm but would like a earlier time. Pt states she is in severe pain. Pt phone number is [phone].

## 2020-10-11 ENCOUNTER — Telehealth: Payer: Self-pay

## 2020-10-11 ENCOUNTER — Telehealth: Payer: Self-pay | Admitting: Orthopaedic Surgery

## 2020-10-11 DIAGNOSIS — M542 Cervicalgia: Secondary | ICD-10-CM

## 2020-10-11 DIAGNOSIS — M5010 Cervical disc disorder with radiculopathy, unspecified cervical region: Secondary | ICD-10-CM

## 2020-10-11 NOTE — Addendum Note (Signed)
Addended by: Robyne Peers on: 10/11/2020 04:16 PM   Modules accepted: Orders

## 2020-10-11 NOTE — Telephone Encounter (Addendum)
Patient called stating that she is having pain in her neck that is radiating down into her right arm that is causing numbness/tingling and burning.   Stated that the Prednisone is not helping.  Would like a call back at [phone].  Please advise.  Thank you.

## 2020-10-11 NOTE — Telephone Encounter (Signed)
Please advise 

## 2020-10-11 NOTE — Telephone Encounter (Signed)
See other messsage

## 2020-10-11 NOTE — Telephone Encounter (Signed)
Message was sent to dr. Ninfa Linden about pain. Waiting on his response

## 2020-10-11 NOTE — Telephone Encounter (Signed)
Pt called requesting a call back from [facility]. Pt didn't disclose reasoning for call just asking for a call back from [place]. Please call pt at [phone].

## 2020-10-11 NOTE — Telephone Encounter (Signed)
I called and talked to the pt. She stated understanding to this. I advised we would put the referral in. She wanted to know what to do about pain and I advised her to try taking tylenol along with the prednisone if she isnt taking this already. She stated she would try this. She wanted to know if she can get in on Monday or Tuesday with neuro since she is going out of town. I advised her that this isnt something I thought could happen that quickly but that I would ask sabrina when she returns on Monday. She was ok with this. She stated she would just like [place] to try.

## 2020-10-14 NOTE — Telephone Encounter (Signed)
Anna [name] is this possible?

## 2020-10-24 ENCOUNTER — Telehealth: Payer: Self-pay | Admitting: Cardiology

## 2020-10-24 NOTE — Telephone Encounter (Signed)
Pt is requesting a Provider Switch from [place] to [facility].

## 2020-10-24 NOTE — Telephone Encounter (Signed)
Pt is requesting a provider switch from Dr. Geraldo Pitter to  Dr. Harriet Masson

## 2020-10-25 DIAGNOSIS — M25511 Pain in right shoulder: Secondary | ICD-10-CM | POA: Diagnosis not present

## 2020-10-25 DIAGNOSIS — M6281 Muscle weakness (generalized): Secondary | ICD-10-CM | POA: Diagnosis not present

## 2020-10-25 DIAGNOSIS — M542 Cervicalgia: Secondary | ICD-10-CM | POA: Diagnosis not present

## 2020-10-28 DIAGNOSIS — M6281 Muscle weakness (generalized): Secondary | ICD-10-CM | POA: Diagnosis not present

## 2020-10-28 DIAGNOSIS — M25511 Pain in right shoulder: Secondary | ICD-10-CM | POA: Diagnosis not present

## 2020-10-28 DIAGNOSIS — M542 Cervicalgia: Secondary | ICD-10-CM | POA: Diagnosis not present

## 2020-11-01 DIAGNOSIS — M6281 Muscle weakness (generalized): Secondary | ICD-10-CM | POA: Diagnosis not present

## 2020-11-01 DIAGNOSIS — M542 Cervicalgia: Secondary | ICD-10-CM | POA: Diagnosis not present

## 2020-11-01 DIAGNOSIS — M25511 Pain in right shoulder: Secondary | ICD-10-CM | POA: Diagnosis not present

## 2020-11-04 DIAGNOSIS — E7800 Pure hypercholesterolemia, unspecified: Secondary | ICD-10-CM | POA: Diagnosis not present

## 2020-11-04 DIAGNOSIS — E559 Vitamin D deficiency, unspecified: Secondary | ICD-10-CM | POA: Diagnosis not present

## 2020-11-04 DIAGNOSIS — E039 Hypothyroidism, unspecified: Secondary | ICD-10-CM | POA: Diagnosis not present

## 2020-11-04 DIAGNOSIS — Z0000 Encounter for general adult medical examination without abnormal findings: Secondary | ICD-10-CM | POA: Diagnosis not present

## 2020-11-06 ENCOUNTER — Ambulatory Visit: Payer: Medicare Other | Admitting: Orthopaedic Surgery

## 2020-11-06 ENCOUNTER — Ambulatory Visit: Payer: Medicare Other | Admitting: Cardiovascular Disease

## 2020-11-06 ENCOUNTER — Encounter: Payer: Self-pay | Admitting: Orthopaedic Surgery

## 2020-11-06 DIAGNOSIS — M1611 Unilateral primary osteoarthritis, right hip: Secondary | ICD-10-CM

## 2020-11-06 DIAGNOSIS — G8929 Other chronic pain: Secondary | ICD-10-CM

## 2020-11-06 DIAGNOSIS — M25551 Pain in right hip: Secondary | ICD-10-CM | POA: Diagnosis not present

## 2020-11-06 DIAGNOSIS — M25561 Pain in right knee: Secondary | ICD-10-CM

## 2020-11-06 DIAGNOSIS — M542 Cervicalgia: Secondary | ICD-10-CM | POA: Diagnosis not present

## 2020-11-06 DIAGNOSIS — M6281 Muscle weakness (generalized): Secondary | ICD-10-CM | POA: Diagnosis not present

## 2020-11-06 DIAGNOSIS — M1711 Unilateral primary osteoarthritis, right knee: Secondary | ICD-10-CM | POA: Diagnosis not present

## 2020-11-06 DIAGNOSIS — M25511 Pain in right shoulder: Secondary | ICD-10-CM | POA: Diagnosis not present

## 2020-11-06 NOTE — Progress Notes (Signed)
The patient is well-known to me.  She has multiple musculoskeletal complaints.  She has well-documented severe end-stage arthritis of her right hip and her right and left knees.  She does ambulate with a cane.  She is 75 years old and moderately obese.  She has has appointment tomorrow with neurosurgery to discuss her cervical stenosis with neck pain and radicular symptoms.  We have recommended her staying off of any narcotics including tramadol and just trying Tylenol arthritis.  She is still not sure what she would like to do at this standpoint.  She still having a lot of pain.  Examination of her right hip shows pain with internal and external rotation and significant stiffness with rotation.  I cannot fully rotate her right hip.  Her left hip moves smoothly normally.  Both knees have varus malalignment with medial joint line tenderness and patellofemoral crepitation with good range of motion.  Both knees are ligamentously stable but painful throughout the arc of rotation.  At this point she would like to see what neurosurgery says tomorrow about her neck and try to put everything together in terms of deciding what is the neck steps for her.  I did offer her considering steroid injections eventually in her knees and her hip again on the right side.  The other option would be surgical options which we can talk to about at some point if he gets to where this pain is definitely affecting her mobility, quality of life and actives day living which it sounds like it already is.  I will see her back in 2 weeks to discuss everything again but no x-rays are needed.

## 2020-11-07 DIAGNOSIS — R030 Elevated blood-pressure reading, without diagnosis of hypertension: Secondary | ICD-10-CM | POA: Diagnosis not present

## 2020-11-07 DIAGNOSIS — M5412 Radiculopathy, cervical region: Secondary | ICD-10-CM | POA: Diagnosis not present

## 2020-11-08 DIAGNOSIS — M542 Cervicalgia: Secondary | ICD-10-CM | POA: Diagnosis not present

## 2020-11-08 DIAGNOSIS — M6281 Muscle weakness (generalized): Secondary | ICD-10-CM | POA: Diagnosis not present

## 2020-11-08 DIAGNOSIS — M25511 Pain in right shoulder: Secondary | ICD-10-CM | POA: Diagnosis not present

## 2020-11-11 DIAGNOSIS — M6281 Muscle weakness (generalized): Secondary | ICD-10-CM | POA: Diagnosis not present

## 2020-11-11 DIAGNOSIS — Z23 Encounter for immunization: Secondary | ICD-10-CM | POA: Diagnosis not present

## 2020-11-11 DIAGNOSIS — K219 Gastro-esophageal reflux disease without esophagitis: Secondary | ICD-10-CM | POA: Diagnosis not present

## 2020-11-11 DIAGNOSIS — M542 Cervicalgia: Secondary | ICD-10-CM | POA: Diagnosis not present

## 2020-11-11 DIAGNOSIS — Z853 Personal history of malignant neoplasm of breast: Secondary | ICD-10-CM | POA: Diagnosis not present

## 2020-11-11 DIAGNOSIS — D509 Iron deficiency anemia, unspecified: Secondary | ICD-10-CM | POA: Diagnosis not present

## 2020-11-11 DIAGNOSIS — E039 Hypothyroidism, unspecified: Secondary | ICD-10-CM | POA: Diagnosis not present

## 2020-11-11 DIAGNOSIS — Z0000 Encounter for general adult medical examination without abnormal findings: Secondary | ICD-10-CM | POA: Diagnosis not present

## 2020-11-11 DIAGNOSIS — E559 Vitamin D deficiency, unspecified: Secondary | ICD-10-CM | POA: Diagnosis not present

## 2020-11-11 DIAGNOSIS — E7800 Pure hypercholesterolemia, unspecified: Secondary | ICD-10-CM | POA: Diagnosis not present

## 2020-11-11 DIAGNOSIS — M25511 Pain in right shoulder: Secondary | ICD-10-CM | POA: Diagnosis not present

## 2020-11-14 DIAGNOSIS — M5412 Radiculopathy, cervical region: Secondary | ICD-10-CM | POA: Diagnosis not present

## 2020-11-14 DIAGNOSIS — M47812 Spondylosis without myelopathy or radiculopathy, cervical region: Secondary | ICD-10-CM | POA: Diagnosis not present

## 2020-11-15 DIAGNOSIS — M25511 Pain in right shoulder: Secondary | ICD-10-CM | POA: Diagnosis not present

## 2020-11-15 DIAGNOSIS — M542 Cervicalgia: Secondary | ICD-10-CM | POA: Diagnosis not present

## 2020-11-15 DIAGNOSIS — M6281 Muscle weakness (generalized): Secondary | ICD-10-CM | POA: Diagnosis not present

## 2020-11-19 DIAGNOSIS — M542 Cervicalgia: Secondary | ICD-10-CM | POA: Diagnosis not present

## 2020-11-19 DIAGNOSIS — M5412 Radiculopathy, cervical region: Secondary | ICD-10-CM | POA: Diagnosis not present

## 2020-11-19 DIAGNOSIS — M6281 Muscle weakness (generalized): Secondary | ICD-10-CM | POA: Diagnosis not present

## 2020-11-19 DIAGNOSIS — M25511 Pain in right shoulder: Secondary | ICD-10-CM | POA: Diagnosis not present

## 2020-11-20 ENCOUNTER — Ambulatory Visit: Payer: Medicare Other | Admitting: Orthopaedic Surgery

## 2020-11-20 ENCOUNTER — Encounter: Payer: Self-pay | Admitting: Orthopaedic Surgery

## 2020-11-20 ENCOUNTER — Other Ambulatory Visit: Payer: Self-pay

## 2020-11-20 DIAGNOSIS — M1712 Unilateral primary osteoarthritis, left knee: Secondary | ICD-10-CM | POA: Diagnosis not present

## 2020-11-20 DIAGNOSIS — M1711 Unilateral primary osteoarthritis, right knee: Secondary | ICD-10-CM | POA: Diagnosis not present

## 2020-11-20 DIAGNOSIS — M1611 Unilateral primary osteoarthritis, right hip: Secondary | ICD-10-CM

## 2020-11-20 NOTE — Progress Notes (Signed)
HPI: Mrs. Bunte returns today follow-up right hip osteoarthritis bilateral knee arthritis.  She states her knees and hips are overall doing okay on Tylenol and using a cane.  She is having on and off pain.  She reports that she did see neurosurgery and they are recommending ESI's cervical spine due to "pinched nerves and osteoarthritis.  Patient has questions about hip replacement knee replacement versus other conservative treatment.  Review of systems see HPI otherwise negative  Physical exam: General well-developed well-nourished female who ambulates with a cane. Right hip she has fluid motion of the hip with pain with extremes of internal and external rotation. Bilateral knees good range of motion both knees.  She has tenderness right knee along medial joint line.  Patellofemoral crepitus both knees.  Varus malalignment both knees.  Hyper extension both knees.  No instability valgus varus stressing bilaterally.  Impression: Right hip osteoarthritis Bilateral knee osteoarthritis  Plan: At this point time she would like to mainly deal with her neck.  She did have questions about the surgery and conservative treatment.  I discussed risk benefits of knee surgery and hip surgery with her.  Risk discussed the postoperative protocol.  Also discussed conservative treatment which would consist of intra-articular injection right hip and cortisone injections to both knees.  She failed conservative treatment with cortisone in both knees and would recommend supplemental injections.  Discussed quad strengthening with her and exercises shown.  She will follow-up with [place] as needed.  Questions encouraged and answered at length.

## 2020-11-21 DIAGNOSIS — M542 Cervicalgia: Secondary | ICD-10-CM | POA: Diagnosis not present

## 2020-11-21 DIAGNOSIS — M6281 Muscle weakness (generalized): Secondary | ICD-10-CM | POA: Diagnosis not present

## 2020-11-21 DIAGNOSIS — M25511 Pain in right shoulder: Secondary | ICD-10-CM | POA: Diagnosis not present

## 2020-11-25 ENCOUNTER — Other Ambulatory Visit: Payer: Self-pay | Admitting: Obstetrics and Gynecology

## 2020-11-25 DIAGNOSIS — Z1231 Encounter for screening mammogram for malignant neoplasm of breast: Secondary | ICD-10-CM

## 2020-11-28 ENCOUNTER — Emergency Department (HOSPITAL_COMMUNITY): Payer: Medicare Other

## 2020-11-28 ENCOUNTER — Encounter (HOSPITAL_COMMUNITY): Payer: Self-pay | Admitting: Emergency Medicine

## 2020-11-28 ENCOUNTER — Emergency Department (HOSPITAL_COMMUNITY)
Admission: EM | Admit: 2020-11-28 | Discharge: 2020-11-28 | Disposition: A | Discharge status: Home / Self Care | Payer: Medicare Other | Attending: Emergency Medicine | Admitting: Emergency Medicine

## 2020-11-28 DIAGNOSIS — R1013 Epigastric pain: Secondary | ICD-10-CM | POA: Insufficient documentation

## 2020-11-28 DIAGNOSIS — R079 Chest pain, unspecified: Secondary | ICD-10-CM | POA: Insufficient documentation

## 2020-11-28 DIAGNOSIS — K449 Diaphragmatic hernia without obstruction or gangrene: Secondary | ICD-10-CM | POA: Diagnosis not present

## 2020-11-28 DIAGNOSIS — Z79899 Other long term (current) drug therapy: Secondary | ICD-10-CM | POA: Insufficient documentation

## 2020-11-28 DIAGNOSIS — Z853 Personal history of malignant neoplasm of breast: Secondary | ICD-10-CM | POA: Insufficient documentation

## 2020-11-28 DIAGNOSIS — I700 Atherosclerosis of aorta: Secondary | ICD-10-CM | POA: Diagnosis not present

## 2020-11-28 LAB — CBC WITH DIFFERENTIAL/PLATELET
Abs Immature Granulocytes: 0.02 10*3/uL (ref 0.00–0.07)
Basophils Absolute: 0 10*3/uL (ref 0.0–0.1)
Basophils Relative: 0 %
Eosinophils Absolute: 0.1 10*3/uL (ref 0.0–0.5)
Eosinophils Relative: 1 %
HCT: 33.3 % — ABNORMAL LOW (ref 36.0–46.0)
Hemoglobin: 11.1 g/dL — ABNORMAL LOW (ref 12.0–15.0)
Immature Granulocytes: 0 %
Lymphocytes Relative: 36 %
Lymphs Abs: 1.8 10*3/uL (ref 0.7–4.0)
MCH: 31.1 pg (ref 26.0–34.0)
MCHC: 33.3 g/dL (ref 30.0–36.0)
MCV: 93.3 fL (ref 80.0–100.0)
Monocytes Absolute: 0.4 10*3/uL (ref 0.1–1.0)
Monocytes Relative: 8 %
Neutro Abs: 2.8 10*3/uL (ref 1.7–7.7)
Neutrophils Relative %: 55 %
Platelets: 189 10*3/uL (ref 150–400)
RBC: 3.57 MIL/uL — ABNORMAL LOW (ref 3.87–5.11)
RDW: 13.9 % (ref 11.5–15.5)
WBC: 5.1 10*3/uL (ref 4.0–10.5)
nRBC: 0 % (ref 0.0–0.2)

## 2020-11-28 LAB — TROPONIN I (HIGH SENSITIVITY)
Troponin I (High Sensitivity): 7 ng/L
Troponin I (High Sensitivity): 7 ng/L

## 2020-11-28 LAB — COMPREHENSIVE METABOLIC PANEL
ALT: 41 U/L (ref 0–44)
AST: 38 U/L (ref 15–41)
Albumin: 3.5 g/dL (ref 3.5–5.0)
Alkaline Phosphatase: 59 U/L (ref 38–126)
Anion gap: 5 (ref 5–15)
BUN: 17 mg/dL (ref 8–23)
CO2: 27 mmol/L (ref 22–32)
Calcium: 9 mg/dL (ref 8.9–10.3)
Chloride: 107 mmol/L (ref 98–111)
Creatinine, Ser: 0.96 mg/dL (ref 0.44–1.00)
GFR, Estimated: >60 mL/min (ref >60)
Glucose, Bld: 88 mg/dL (ref 70–99)
Potassium: 3.2 mmol/L — ABNORMAL LOW (ref 3.5–5.1)
Sodium: 139 mmol/L (ref 135–145)
Total Bilirubin: 1.3 mg/dL — ABNORMAL HIGH (ref 0.3–1.2)
Total Protein: 6.3 g/dL — ABNORMAL LOW (ref 6.5–8.1)

## 2020-11-28 LAB — LIPASE, BLOOD: Lipase: 24 U/L (ref 11–51)

## 2020-11-28 MED ORDER — ALUM & MAG HYDROXIDE-SIMETH 200-200-20 MG/5ML PO SUSP
30.0000 mL | Freq: Once | ORAL | Status: AC
  Administered 2020-11-28: 30 mL via ORAL
  Filled 2020-11-28: qty 30

## 2020-11-28 NOTE — ED Notes (Signed)
This Rn went in to room to draw the patient's second troponin but the CT transporter arrived to take the patient to CT. Pt transported to CT at this time - will collect labs when the patient returns to her room.

## 2020-11-28 NOTE — ED Notes (Signed)
Patient transported to CT 

## 2020-11-28 NOTE — ED Notes (Signed)
Pt has returned from CT.  

## 2020-11-28 NOTE — Discharge Instructions (Addendum)
Take your Prilosec and Tums for symptomatic relief.  Follow-up with a gastroenterologist and your primary care doctor as discussed.  Return here as needed if you have any worsening symptoms.

## 2020-11-28 NOTE — ED Notes (Signed)
Pt A&Ox4 ambulatory at d/c with independent steady gait but was wheeled out of ED via wheelchair. Pt verbalized understanding of d/c instructions and follow up care.

## 2020-11-28 NOTE — ED Triage Notes (Signed)
Patient here for evaluation of 3/10 central chest pressure that started several days ago. Patient states pain is temporarily improved by gas-x tablets but returns approximately 30 minutes later. Patient alert, oriented, and in no apparent distress at this time.

## 2020-11-28 NOTE — ED Provider Notes (Signed)
Emergency Medicine Provider Triage Evaluation Note  Anna [name] , [name] 75 y.o. female  was evaluated in triage.  Pt complains of CP.  Began yesterday.  Pain constant however fluctuates in intensity.  Described as [name] pressure-like sensation to her substernal, central chest.  Does not radiate into her back, left arm or jaw.  No associated numbness, weakness, back pain, diaphoresis, shortness of breath, cough.  Recent Lexiscan in August  Review of Systems  Positive: CP Negative: Back pain, shortness of breath, cough, weakness  Physical Exam  BP 123/69 (BP Location: Right Arm)   Pulse 65   Temp 98.6 F (37 C) (Oral)   Resp 18   SpO2 100%  Gen:   Awake, no distress   Resp:  Normal effort  ABD:  Diffuse tenderness to epigastric region MSK:   Moves extremities without difficulty  Other:    Medical Decision Making  Medically screening exam initiated at 2:00 PM.  Appropriate orders placed.  Anna [name] was informed that the remainder of the evaluation will be completed by another provider, this initial triage assessment does not replace that evaluation, and the importance of remaining in the ED until their evaluation is complete.  Abdominal pain however has some epigastric pain on palpation.  Denies NSAID use, melena. Will get chest, belly labs.  Hemodynamically stable   Anna Yau A, PA-C 11/28/20 1402    Anna Boss, MD 11/28/20 1625

## 2020-11-28 NOTE — ED Provider Notes (Signed)
[facility] EMERGENCY DEPARTMENT Provider Note   CSN: [id number] Arrival date & time: 11/28/20  1205     History Chief Complaint  Patient presents with   Chest Pain    Anna [name] is a 75 y.o. female.  Patient is a 75 year old female who presents with epigastric pain.  She says it starts in her epigastrium and radiates across both sides.  There is a little bit up into her center of her chest.  She says been going on for the last 3 to 4 days.  Its fairly constant but does wax and wane.  No associated nausea or vomiting.  No other abdominal pain.  No change in her stools.  No urinary symptoms.  No shortness of breath other than she does get short of breath when she goes out for walks in the morning but this is unchanged from her baseline.  No leg swelling.  She does not report that anything really makes it worse.  Is not worse after eating.  Does seem to get a little bit better with Gas-X.  She also takes Prilosec.  She does not have any exertional symptoms.      Past Medical History:  Diagnosis Date   Arthritis    Breast cancer [facility])    age 65   Headache    Osteopenia    Vision abnormalities     Patient Active Problem List   Diagnosis Date Noted   Unilateral primary osteoarthritis, right hip 11/06/2020   Unilateral primary osteoarthritis, right knee 11/06/2020   Chest pain of uncertain etiology 42/35/3614   Cardiac murmur 08/29/2020   Mixed dyslipidemia 08/29/2020   Arthritis 08/16/2020   Breast cancer ([place]) 08/16/2020   Headache 08/16/2020   Osteopenia 08/16/2020   Vision abnormalities 08/16/2020   Chronic right shoulder pain 03/07/2018   Chronic migraine 08/11/2017    Past Surgical History:  Procedure Laterality Date   BACK SURGERY     lumbar region   BUNIONECTOMY Bilateral    MASTECTOMY Left    at age 10   [place] Left    left thumb   TUBAL LIGATION       OB History   No obstetric history on file.     Family  History  Problem Relation Age of Onset   Hyperlipidemia Mother    Hypertension Mother    Alzheimer's disease Father    Hypertension Sister    Hypertension Sister    Hypertension Sister    Hypertension Brother    Lung cancer Brother    Hypertension Sister    Hypertension Brother     Social History   Tobacco Use   Smoking status: Never   Smokeless tobacco: Never  Substance Use Topics   Alcohol use: Never   Drug use: Never    Home Medications Prior to Admission medications   Medication Sig Start Date End Date Taking? Authorizing Provider  acetaminophen (TYLENOL) 325 MG tablet Take 650 mg by mouth every 6 (six) hours as needed for mild pain.    [provider]  Calcium 600-200 MG-UNIT tablet Take 2 tablets by mouth daily.    [provider]  ferrous sulfate (SLOW FE) 160 (50 Fe) MG TBCR SR tablet Take 1 tablet by mouth daily.    [provider]  gabapentin (NEURONTIN) 100 MG capsule TAKE 1 CAPSULE BY MOUTH THREE TIMES DAILY 09/06/18   [name], PA-C  levothyroxine (SYNTHROID) 100 MCG tablet Take 100 mcg by mouth  daily. 05/01/19   [provider]  lidocaine (LIDODERM) 5 % Place 1 patch onto the skin daily. Remove & Discard patch within 12 hours or as directed by MD 08/01/19   [name], PA-C  methylPREDNISolone (MEDROL) 4 MG tablet Take as directed 10/09/20   [name], PA-C  omeprazole (PRILOSEC) 20 MG capsule Take 1 capsule (20 mg total) by mouth daily. 08/08/20   [name], PA-C  pravastatin (PRAVACHOL) 40 MG tablet Take 40 mg by mouth daily.  12/28/16   [provider]  SUMAtriptan (IMITREX) 25 MG tablet Take 1 tab at onset of migraine.  May repeat in 2 hrs, if needed.  Max dose: 2 tabs/day. This is a 30 day prescription. 10/03/18   [name], MD  tiZANidine (ZANAFLEX) 4 MG tablet Take 1 tablet (4 mg total) by mouth at bedtime. 10/06/20   Jaynee Eagles, PA-C    Allergies    Septra [sulfamethoxazole-trimethoprim] and Sulfa  antibiotics  Review of Systems   Review of Systems  Constitutional:  Negative for chills, diaphoresis, fatigue and fever.  HENT:  Negative for congestion, rhinorrhea and sneezing.   Eyes: Negative.   Respiratory:  Negative for cough, chest tightness and shortness of breath.   Cardiovascular:  Positive for chest pain. Negative for leg swelling.  Gastrointestinal:  Positive for abdominal pain. Negative for blood in stool, diarrhea, nausea and vomiting.  Genitourinary:  Negative for difficulty urinating, flank pain, frequency and hematuria.  Musculoskeletal:  Negative for arthralgias and back pain.  Skin:  Negative for rash.  Neurological:  Negative for dizziness, speech difficulty, weakness, numbness and headaches.   Physical Exam Updated Vital Signs BP 117/74   Pulse 63   Temp 98.6 F (37 C) (Oral)   Resp 15   SpO2 100%   Physical Exam Constitutional:      Appearance: She is well-developed.  HENT:     Head: Normocephalic and atraumatic.  Eyes:     Pupils: Pupils are equal, round, and reactive to light.  Cardiovascular:     Rate and Rhythm: Normal rate and regular rhythm.     Heart sounds: Normal heart sounds.  Pulmonary:     Effort: Pulmonary effort is normal. No respiratory distress.     Breath sounds: Normal breath sounds. No wheezing or rales.  Chest:     Chest wall: No tenderness.  Abdominal:     General: Bowel sounds are normal.     Palpations: Abdomen is soft.     Tenderness: There is abdominal tenderness (Mild tenderness to the epigastrium). There is no guarding or rebound.  Musculoskeletal:        General: Normal range of motion.     Cervical back: Normal range of motion and neck supple.  Lymphadenopathy:     Cervical: No cervical adenopathy.  Skin:    General: Skin is warm and dry.     Findings: No rash.  Neurological:     Mental Status: She is alert and oriented to person, place, and time.    ED Results / Procedures / Treatments   Labs (all labs  ordered are listed, but only abnormal results are displayed) Labs Reviewed  CBC WITH DIFFERENTIAL/PLATELET - Abnormal; Notable for the following components:      Result Value   RBC 3.57 (*)    Hemoglobin 11.1 (*)    HCT 33.3 (*)    All other components within normal limits  COMPREHENSIVE METABOLIC PANEL - Abnormal; Notable for the following components:  Potassium 3.2 (*)    Total Protein 6.3 (*)    Total Bilirubin 1.3 (*)    All other components within normal limits  LIPASE, BLOOD  TROPONIN I (HIGH SENSITIVITY)  TROPONIN I (HIGH SENSITIVITY)    EKG EKG Interpretation  Date/Time:  Thursday November 28 2020 12:29:59 EDT Ventricular Rate:  70 PR Interval:  174 QRS Duration: 82 QT Interval:  368 QTC Calculation: 397 R Axis:   38 Text Interpretation: Normal sinus rhythm with sinus arrhythmia Nonspecific ST and T wave abnormality Abnormal ECG since last tracing no significant change Confirmed by Malvin Johns [phone]) on 11/28/2020 6:26:30 PM  Radiology CT Abdomen Pelvis Wo Contrast  Result Date: 11/28/2020 CLINICAL DATA:  Epigastric pain central pressure EXAM: CT ABDOMEN AND PELVIS WITHOUT CONTRAST TECHNIQUE: Multidetector CT imaging of the abdomen and pelvis was performed following the standard protocol without IV contrast. COMPARISON:  Ultrasound 09/05/2020 FINDINGS: Lower chest: Lung bases demonstrate atelectasis or scarring at the left base. No acute airspace disease. Borderline cardiomegaly. Small hiatal hernia. Hepatobiliary: No focal liver abnormality is seen. No gallstones, gallbladder wall thickening, or biliary dilatation. Pancreas: Unremarkable. No pancreatic ductal dilatation or surrounding inflammatory changes. Spleen: Normal in size without focal abnormality. Adrenals/Urinary Tract: Adrenal glands are normal. Low-density lesions in the bilateral kidneys are probably cysts but cannot be further characterized without contrast. No hydronephrosis. The bladder is normal  Stomach/Bowel: Possible thickening of the gastric cardia. No dilated small bowel. Large stool burden. No acute bowel wall thickening. Negative appendix. Vascular/Lymphatic: Mild aortic atherosclerosis. No aneurysm. No suspicious nodes Reproductive: Calcified fibroid within the anterior uterine corpus. No adnexal mass Other: Negative for pelvic effusion or free air Musculoskeletal: Degenerative changes.  No acute osseous abnormality IMPRESSION: 1. No CT evidence for acute intra-abdominal or pelvic abnormality. 2. Possible thickening of the gastric cardia, suggest correlation with nonemergent endoscopy to exclude mass. 3. Calcified uterine fibroid Electronically Signed   By: Donavan Foil M.D.   On: 11/28/2020 20:35   DG Chest 2 View  Result Date: 11/28/2020 CLINICAL DATA:  Chest pain. EXAM: CHEST - 2 VIEW COMPARISON:  August 12, 2020. FINDINGS: Stable cardiomediastinal silhouette. Both lungs are clear. The visualized skeletal structures are unremarkable. IMPRESSION: No active cardiopulmonary disease. Electronically Signed   By: Marijo Conception M.D.   On: 11/28/2020 15:00    Procedures Procedures   Medications Ordered in ED Medications  alum & mag hydroxide-simeth (MAALOX/MYLANTA) 200-200-20 MG/5ML suspension 30 mL (30 mLs Oral Given 11/28/20 1902)    ED Course  I have reviewed the triage vital signs and the nursing notes.  Pertinent labs & imaging results that were available during my care of the patient were reviewed by me and considered in my medical decision making (see chart for details).    MDM Rules/Calculators/A&P                           Patient is a 75 year old who presents with epigastric discomfort.  She has no pain specifically over the gallbladder.  She had a recent gallbladder ultrasound within the last couple of months that showed no gallstones.  Her LFTs are nonconcerning.  Her lipase is normal.  Her EKG does not show any ischemic changes.  Her troponins are negative.  I did do  a CT scan which shows no acute abnormality other than there is some inflammation in the stomach.  It is recommended that she get an endoscopy to further evaluate this area.  I discussed these findings with the patient.  She will continue her Prilosec and use Tums as needed.  She was encouraged to follow-up with her PCP and gastroenterologist.  Return precautions were given. Final Clinical Impression(s) / ED Diagnoses Final diagnoses:  Epigastric pain    Rx / DC Orders ED Discharge Orders     None        Malvin Johns, MD 11/28/20 2239

## 2020-12-02 DIAGNOSIS — K219 Gastro-esophageal reflux disease without esophagitis: Secondary | ICD-10-CM | POA: Diagnosis not present

## 2020-12-02 DIAGNOSIS — R1013 Epigastric pain: Secondary | ICD-10-CM | POA: Diagnosis not present

## 2020-12-02 DIAGNOSIS — R079 Chest pain, unspecified: Secondary | ICD-10-CM | POA: Diagnosis not present

## 2020-12-02 DIAGNOSIS — M5412 Radiculopathy, cervical region: Secondary | ICD-10-CM | POA: Diagnosis not present

## 2020-12-03 DIAGNOSIS — M542 Cervicalgia: Secondary | ICD-10-CM | POA: Diagnosis not present

## 2020-12-03 DIAGNOSIS — M6281 Muscle weakness (generalized): Secondary | ICD-10-CM | POA: Diagnosis not present

## 2020-12-03 DIAGNOSIS — M25511 Pain in right shoulder: Secondary | ICD-10-CM | POA: Diagnosis not present

## 2020-12-10 DIAGNOSIS — R079 Chest pain, unspecified: Secondary | ICD-10-CM | POA: Diagnosis not present

## 2020-12-10 DIAGNOSIS — R1013 Epigastric pain: Secondary | ICD-10-CM | POA: Diagnosis not present

## 2020-12-10 DIAGNOSIS — K2950 Unspecified chronic gastritis without bleeding: Secondary | ICD-10-CM | POA: Diagnosis not present

## 2020-12-10 DIAGNOSIS — K3189 Other diseases of stomach and duodenum: Secondary | ICD-10-CM | POA: Diagnosis not present

## 2020-12-11 DIAGNOSIS — K219 Gastro-esophageal reflux disease without esophagitis: Secondary | ICD-10-CM | POA: Diagnosis not present

## 2020-12-13 DIAGNOSIS — M25511 Pain in right shoulder: Secondary | ICD-10-CM | POA: Diagnosis not present

## 2020-12-13 DIAGNOSIS — M6281 Muscle weakness (generalized): Secondary | ICD-10-CM | POA: Diagnosis not present

## 2020-12-13 DIAGNOSIS — M542 Cervicalgia: Secondary | ICD-10-CM | POA: Diagnosis not present

## 2020-12-17 DIAGNOSIS — M5412 Radiculopathy, cervical region: Secondary | ICD-10-CM | POA: Diagnosis not present

## 2020-12-20 DIAGNOSIS — M542 Cervicalgia: Secondary | ICD-10-CM | POA: Diagnosis not present

## 2020-12-20 DIAGNOSIS — M6281 Muscle weakness (generalized): Secondary | ICD-10-CM | POA: Diagnosis not present

## 2020-12-20 DIAGNOSIS — M25511 Pain in right shoulder: Secondary | ICD-10-CM | POA: Diagnosis not present

## 2020-12-31 ENCOUNTER — Ambulatory Visit
Admission: RE | Admit: 2020-12-31 | Discharge: 2020-12-31 | Disposition: A | Discharge status: Home / Self Care | Payer: Medicare Other | Source: Ambulatory Visit | Attending: Obstetrics and Gynecology | Admitting: Obstetrics and Gynecology

## 2020-12-31 DIAGNOSIS — Z1231 Encounter for screening mammogram for malignant neoplasm of breast: Secondary | ICD-10-CM

## 2021-01-01 ENCOUNTER — Other Ambulatory Visit: Payer: Self-pay | Admitting: Obstetrics and Gynecology

## 2021-01-01 DIAGNOSIS — R928 Other abnormal and inconclusive findings on diagnostic imaging of breast: Secondary | ICD-10-CM

## 2021-01-02 DIAGNOSIS — M6281 Muscle weakness (generalized): Secondary | ICD-10-CM | POA: Diagnosis not present

## 2021-01-02 DIAGNOSIS — M542 Cervicalgia: Secondary | ICD-10-CM | POA: Diagnosis not present

## 2021-01-02 DIAGNOSIS — M25511 Pain in right shoulder: Secondary | ICD-10-CM | POA: Diagnosis not present

## 2021-01-03 ENCOUNTER — Ambulatory Visit
Admission: RE | Admit: 2021-01-03 | Discharge: 2021-01-03 | Disposition: A | Discharge status: Home / Self Care | Payer: Medicare Other | Source: Ambulatory Visit | Attending: Obstetrics and Gynecology | Admitting: Obstetrics and Gynecology

## 2021-01-03 ENCOUNTER — Ambulatory Visit: Payer: Medicare Other

## 2021-01-03 DIAGNOSIS — R922 Inconclusive mammogram: Secondary | ICD-10-CM | POA: Diagnosis not present

## 2021-01-03 DIAGNOSIS — R928 Other abnormal and inconclusive findings on diagnostic imaging of breast: Secondary | ICD-10-CM

## 2021-01-08 DIAGNOSIS — K219 Gastro-esophageal reflux disease without esophagitis: Secondary | ICD-10-CM | POA: Diagnosis not present

## 2021-01-10 DIAGNOSIS — M25511 Pain in right shoulder: Secondary | ICD-10-CM | POA: Diagnosis not present

## 2021-01-10 DIAGNOSIS — M542 Cervicalgia: Secondary | ICD-10-CM | POA: Diagnosis not present

## 2021-01-10 DIAGNOSIS — M6281 Muscle weakness (generalized): Secondary | ICD-10-CM | POA: Diagnosis not present

## 2021-01-17 DIAGNOSIS — M25511 Pain in right shoulder: Secondary | ICD-10-CM | POA: Diagnosis not present

## 2021-01-17 DIAGNOSIS — M6281 Muscle weakness (generalized): Secondary | ICD-10-CM | POA: Diagnosis not present

## 2021-01-17 DIAGNOSIS — M542 Cervicalgia: Secondary | ICD-10-CM | POA: Diagnosis not present

## 2021-01-29 DIAGNOSIS — L723 Sebaceous cyst: Secondary | ICD-10-CM | POA: Diagnosis not present

## 2021-02-04 DIAGNOSIS — M546 Pain in thoracic spine: Secondary | ICD-10-CM | POA: Diagnosis not present

## 2021-02-05 ENCOUNTER — Other Ambulatory Visit: Payer: Medicare Other

## 2021-02-06 ENCOUNTER — Other Ambulatory Visit: Payer: Self-pay

## 2021-02-06 ENCOUNTER — Encounter: Payer: Self-pay | Admitting: Cardiology

## 2021-02-06 ENCOUNTER — Other Ambulatory Visit: Payer: Self-pay | Admitting: Physician Assistant

## 2021-02-06 ENCOUNTER — Ambulatory Visit: Payer: Medicare Other | Admitting: Cardiology

## 2021-02-06 VITALS — BP 116/72 | HR 78 | Ht 66.0 in | Wt 201.8 lb

## 2021-02-06 DIAGNOSIS — R0789 Other chest pain: Secondary | ICD-10-CM

## 2021-02-06 DIAGNOSIS — E669 Obesity, unspecified: Secondary | ICD-10-CM | POA: Diagnosis not present

## 2021-02-06 DIAGNOSIS — E782 Mixed hyperlipidemia: Secondary | ICD-10-CM | POA: Diagnosis not present

## 2021-02-06 NOTE — Patient Instructions (Signed)

## 2021-02-06 NOTE — Progress Notes (Signed)
Cardiology Office Note:    Date:  02/06/2021   ID:  Anna [name], DOB January 17, 1946, MRN [id number]  PCP:  [name], MD  Cardiologist:  Berniece Salines, DO  Electrophysiologist:  None   Referring MD: [name], MD   " I am doing ok"  History of Present Illness:    Anna [name] is a 76 y.o. female with a hx of hyperlipidemia, obesity who is here today for follow-up visit.  The patient previously saw my partner Dr. Geraldo Pitter on August 29, 2020 at that time she was experiencing chest discomfort.  Though atypical a nuclear stress test was ordered.  She did get her nuclear stress test which was low risk with no evidence of ischemia or infarction.  Her echocardiogram was also normal.  And transition the patient requested to switch providers.  She is here today and this is my first appointment with the patient.  She tells me that she has been doing well she has not had any shortness of breath or chest pain.  Recently she notes that she was pulling a rubber band and she after that started experience pain around her rib cage.  She has been taking Tylenol for this and this has improved her pain.  Past Medical History:  Diagnosis Date   Arthritis    Breast cancer [facility])    age 11   Headache    Osteopenia    Vision abnormalities     Past Surgical History:  Procedure Laterality Date   BACK SURGERY     lumbar region   BUNIONECTOMY Bilateral    MASTECTOMY Left    at age 39   TRIGGER FINGER RELEASE Left    left thumb   TUBAL LIGATION      Current Medications: Current Meds  Medication Sig   acetaminophen (TYLENOL) 325 MG tablet Take 650 mg by mouth every 6 (six) hours as needed for mild pain.   Calcium 600-200 MG-UNIT tablet Take 2 tablets by mouth daily.   famotidine (PEPCID) 40 MG tablet famotidine 40 mg tablet  TAKE 1 TABLET BY MOUTH AT BEDTIME   ferrous sulfate (SLOW FE) 160 (50 Fe) MG TBCR SR tablet Take 1 tablet by mouth daily.   gabapentin (NEURONTIN) 100 MG capsule TAKE 1  CAPSULE BY MOUTH THREE TIMES DAILY   levothyroxine (SYNTHROID) 100 MCG tablet Take 100 mcg by mouth daily.   lidocaine (LIDODERM) 5 % Place 1 patch onto the skin daily. Remove & Discard patch within 12 hours or as directed by MD   Meloxicam 10 MG CAPS 1 capsule   methylPREDNISolone (MEDROL) 4 MG tablet Take as directed   omeprazole (PRILOSEC) 20 MG capsule Take 1 capsule (20 mg total) by mouth daily.   pravastatin (PRAVACHOL) 40 MG tablet Take 40 mg by mouth daily.    SUMAtriptan (IMITREX) 25 MG tablet Take 1 tab at onset of migraine.  May repeat in 2 hrs, if needed.  Max dose: 2 tabs/day. This is a 30 day prescription.   tiZANidine (ZANAFLEX) 4 MG tablet Take 1 tablet (4 mg total) by mouth at bedtime.     Allergies:   Septra [sulfamethoxazole-trimethoprim] and Sulfa antibiotics   Social History   Socioeconomic History   Marital status: Married    Spouse name: Not on file   Number of children: Not on file   Years of education: Not on file   Highest education level: Not on file  Occupational History   Not on file  Tobacco  Use   Smoking status: Never   Smokeless tobacco: Never  Substance and Sexual Activity   Alcohol use: Never   Drug use: Never   Sexual activity: Not Currently  Other Topics Concern   Not on file  Social History Narrative   Not on file   Social Determinants of Health   Financial Resource Strain: Not on file  Food Insecurity: Not on file  Transportation Needs: Not on file  Physical Activity: Not on file  Stress: Not on file  Social Connections: Not on file     Family History: The patient's family history includes Alzheimer's disease in her father; Hyperlipidemia in her mother; Hypertension in her brother, brother, mother, sister, sister, sister, and sister; Lung cancer in her brother.  ROS:   Review of Systems  Constitution: Negative for decreased appetite, fever and weight gain.  HENT: Negative for congestion, ear discharge, hoarse voice and sore  throat.   Eyes: Negative for discharge, redness, vision loss in right eye and visual halos.  Cardiovascular: Negative for chest pain, dyspnea on exertion, leg swelling, orthopnea and palpitations.  Respiratory: Negative for cough, hemoptysis, shortness of breath and snoring.   Endocrine: Negative for heat intolerance and polyphagia.  Hematologic/Lymphatic: Negative for bleeding problem. Does not bruise/bleed easily.  Skin: Negative for flushing, nail changes, rash and suspicious lesions.  Musculoskeletal: Negative for arthritis, joint pain, muscle cramps, myalgias, neck pain and stiffness.  Gastrointestinal: Negative for abdominal pain, bowel incontinence, diarrhea and excessive appetite.  Genitourinary: Negative for decreased libido, genital sores and incomplete emptying.  Neurological: Negative for brief paralysis, focal weakness, headaches and loss of balance.  Psychiatric/Behavioral: Negative for altered mental status, depression and suicidal ideas.  Allergic/Immunologic: Negative for HIV exposure and persistent infections.    EKGs/Labs/Other Studies Reviewed:    The following studies were reviewed today:   EKG: None today  Recent Labs: 11/28/2020: ALT 41; BUN 17; Creatinine, Ser 0.96; Hemoglobin 11.1; Platelets 189; Potassium 3.2; Sodium 139  Recent Lipid Panel No results found for: CHOL, TRIG, HDL, CHOLHDL, VLDL, LDLCALC, LDLDIRECT  Physical Exam:    VS:  BP 116/72    Pulse 78    Ht 5\' 6"  (1.676 m)    Wt 201 lb 12.8 oz (91.5 kg)    SpO2 100%    BMI 32.57 kg/m     Wt Readings from Last 3 Encounters:  02/06/21 201 lb 12.8 oz (91.5 kg)  09/03/20 196 lb (88.9 kg)  08/29/20 196 lb 1.3 oz (88.9 kg)     GEN: Well nourished, well developed in no acute distress HEENT: Normal NECK: No JVD; No carotid bruits LYMPHATICS: No lymphadenopathy CARDIAC: S1S2 noted,RRR, no murmurs, rubs, gallops RESPIRATORY:  Clear to auscultation without rales, wheezing or rhonchi  ABDOMEN: Soft,  non-tender, non-distended, +bowel sounds, no guarding. EXTREMITIES: No edema, No cyanosis, no clubbing MUSCULOSKELETAL:  No deformity  SKIN: Warm and dry NEUROLOGIC:  Alert and oriented x 3, non-focal PSYCHIATRIC:  Normal affect, good insight  ASSESSMENT:    1. Mixed hyperlipidemia   2. Musculoskeletal chest pain   3. Obesity (BMI 30-39.9)    PLAN:    The rib cage pain does sound musculoskeletal.  But we will continue to monitor the patient.  Her recent stress test which was done in August 2022 was low risk and normal without any evidence of ischemia.  We will bring the patient closer in 6 months for close monitoring.  Hyperlipidemia - continue with current statin medication.  The patient understands the  need to lose weight with diet and exercise. We have discussed specific strategies for this.  The patient is in agreement with the above plan. The patient left the office in stable condition.  The patient will follow up in  Medication Adjustments/Labs and Tests Ordered: Current medicines are reviewed at length with the patient today.  Concerns regarding medicines are outlined above.  No orders of the defined types were placed in this encounter.  No orders of the defined types were placed in this encounter.   Patient Instructions  Medication Instructions:  Your physician recommends that you continue on your current medications as directed. Please refer to the Current Medication list given to you today.  *If you need a refill on your cardiac medications before your next appointment, please call your pharmacy*   Lab Work: None If you have labs (blood work) drawn today and your tests are completely normal, you will receive your results only by: [place] (if you have MyChart) OR A paper copy in the mail If you have any lab test that is abnormal or we need to change your treatment, we will call you to review the results.   Testing/Procedures: None   Follow-Up: At [facility], you and your health needs are our priority.  As part of our continuing mission to provide you with exceptional heart care, we have created designated Provider Care Teams.  These Care Teams include your primary Cardiologist (physician) and Advanced Practice Providers (APPs -  Physician Assistants and Nurse Practitioners) who all work together to provide you with the care you need, when you need it.  We recommend signing up for the patient portal called "MyChart".  Sign up information is provided on this After Visit Summary.  MyChart is used to connect with patients for Virtual Visits (Telemedicine).  Patients are able to view lab/test results, encounter notes, upcoming appointments, etc.  Non-urgent messages can be sent to your provider as well.   To learn more about what you can do with MyChart, go to [email or web address].    Your next appointment:   6 month(s)  The format for your next appointment:   In Person  Provider:   Berniece Salines, DO     Other Instructions     Adopting a Healthy Lifestyle.  Know what a healthy weight is for you (roughly BMI <25) and aim to maintain this   Aim for 7+ servings of fruits and vegetables daily   65-80+ fluid ounces of water or unsweet tea for healthy kidneys   Limit to max 1 drink of alcohol per day; avoid smoking/tobacco   Limit animal fats in diet for cholesterol and heart health - choose grass fed whenever available   Avoid highly processed foods, and foods high in saturated/trans fats   Aim for low stress - take time to unwind and care for your mental health   Aim for 150 min of moderate intensity exercise weekly for heart health, and weights twice weekly for bone health   Aim for 7-9 hours of sleep daily   When it comes to diets, agreement about the perfect plan isnt easy to find, even among the experts. Experts at the [employer] developed an idea known as the Healthy Eating Plate. Just imagine a plate  divided into logical, healthy portions.   The emphasis is on diet quality:   Load up on vegetables and fruits - one-half of your plate: Aim for color and variety, and remember that potatoes dont  count.   Go for whole grains - one-quarter of your plate: Whole wheat, barley, wheat berries, quinoa, oats, brown rice, and foods made with them. If you want pasta, go with whole wheat pasta.   Protein power - one-quarter of your plate: Fish, chicken, beans, and nuts are all healthy, versatile protein sources. Limit red meat.   The diet, however, does go beyond the plate, offering a few other suggestions.   Use healthy plant oils, such as olive, canola, soy, corn, sunflower and peanut. Check the labels, and avoid partially hydrogenated oil, which have unhealthy trans fats.   If youre thirsty, drink water. Coffee and tea are good in moderation, but skip sugary drinks and limit milk and dairy products to one or two daily servings.   The type of carbohydrate in the diet is more important than the amount. Some sources of carbohydrates, such as vegetables, fruits, whole grains, and beans-are healthier than others.   Finally, stay active  Signed, Berniece Salines, DO  02/06/2021 11:03 AM    [place]

## 2021-02-07 DIAGNOSIS — M6281 Muscle weakness (generalized): Secondary | ICD-10-CM | POA: Diagnosis not present

## 2021-02-07 DIAGNOSIS — M542 Cervicalgia: Secondary | ICD-10-CM | POA: Diagnosis not present

## 2021-02-07 DIAGNOSIS — M25511 Pain in right shoulder: Secondary | ICD-10-CM | POA: Diagnosis not present

## 2021-02-10 ENCOUNTER — Ambulatory Visit: Payer: Medicare Other | Admitting: Orthopaedic Surgery

## 2021-02-10 ENCOUNTER — Ambulatory Visit: Payer: Self-pay

## 2021-02-10 ENCOUNTER — Encounter: Payer: Self-pay | Admitting: Orthopaedic Surgery

## 2021-02-10 VITALS — Ht 66.0 in | Wt 201.8 lb

## 2021-02-10 DIAGNOSIS — G8929 Other chronic pain: Secondary | ICD-10-CM | POA: Diagnosis not present

## 2021-02-10 DIAGNOSIS — M25512 Pain in left shoulder: Secondary | ICD-10-CM | POA: Diagnosis not present

## 2021-02-10 DIAGNOSIS — M25561 Pain in right knee: Secondary | ICD-10-CM | POA: Diagnosis not present

## 2021-02-10 DIAGNOSIS — M1611 Unilateral primary osteoarthritis, right hip: Secondary | ICD-10-CM

## 2021-02-10 DIAGNOSIS — M25551 Pain in right hip: Secondary | ICD-10-CM | POA: Diagnosis not present

## 2021-02-10 DIAGNOSIS — M1711 Unilateral primary osteoarthritis, right knee: Secondary | ICD-10-CM

## 2021-02-10 MED ORDER — METHYLPREDNISOLONE ACETATE 40 MG/ML IJ SUSP
40.0000 mg | INTRAMUSCULAR | Status: AC | PRN
  Administered 2021-02-10: 40 mg via INTRA_ARTICULAR

## 2021-02-10 MED ORDER — LIDOCAINE HCL 1 % IJ SOLN
3.0000 mL | INTRAMUSCULAR | Status: AC | PRN
  Administered 2021-02-10: 3 mL

## 2021-02-10 MED ORDER — LIDOCAINE HCL 1 % IJ SOLN
3.0000 mL | INTRAMUSCULAR | Status: AC | PRN
Start: 2021-02-10 — End: 2021-02-10
  Administered 2021-02-10: 3 mL

## 2021-02-10 NOTE — Progress Notes (Signed)
Office Visit Note   Patient: Anna [name]           Date of Birth: 01/30/1946           MRN: [id number] Visit Date: 02/10/2021              Requested by: [name], MD [address] [place],  [place] [address] PCP: [name], MD   Assessment & Plan: Visit Diagnoses:  1. Pain in right hip   2. Chronic pain of right knee   3. Unilateral primary osteoarthritis, right hip   4. Primary osteoarthritis of right knee   5. Chronic left shoulder pain     Plan: I spoke to her in length in detail about joint replacement surgery including hip replacement and knee replacement surgery.  She decided to proceed with her right hip first and I agree with this.  We talked about the risks and benefits of surgery and what to expect from an intraoperative and postoperative course.  I then placed a steroid injection per her request in her left shoulder and her right knee.  She is going to discuss this with her siblings and is currently considering surgery around the end of February to early March.  Follow-Up Instructions: Return for 2 weeks post-op.   Orders:  Orders Placed This Encounter  Procedures   Large Joint Inj   Large Joint Inj   XR Knee 1-2 Views Right   XR HIP UNILAT W OR W/O PELVIS 1V RIGHT   No orders of the defined types were placed in this encounter.     Procedures: Large Joint Inj: L subacromial bursa on 02/10/2021 2:16 PM Indications: pain and diagnostic evaluation Details: 22 G 1.5 in needle  Arthrogram: No  Medications: 3 mL lidocaine 1 %; 40 mg methylPREDNISolone acetate 40 MG/ML Outcome: tolerated well, no immediate complications Procedure, treatment alternatives, risks and benefits explained, specific risks discussed. Consent was given by the patient. Immediately prior to procedure a time out was called to verify the correct patient, procedure, equipment, support staff and site/side marked as required. Patient was prepped and draped  in the usual sterile fashion.    Large Joint Inj: R knee on 02/10/2021 2:17 PM Indications: diagnostic evaluation and pain Details: 22 G 1.5 in needle, superolateral approach  Arthrogram: No  Medications: 3 mL lidocaine 1 %; 40 mg methylPREDNISolone acetate 40 MG/ML Outcome: tolerated well, no immediate complications Procedure, treatment alternatives, risks and benefits explained, specific risks discussed. Consent was given by the patient. Immediately prior to procedure a time out was called to verify the correct patient, procedure, equipment, support staff and site/side marked as required. Patient was prepped and draped in the usual sterile fashion.      Clinical Data: No additional findings.   Subjective: Chief Complaint  Patient presents with   Right Hip - Pain   Right Knee - Pain  The patient comes in today for further evaluation and treatment of known osteoarthritis of her right hip and both her knees.  She is also been dealing with left shoulder pain.  She does ambulate with a cane.  She is not a diabetic.  She has tried and failed conservative treatment for well over a year as a relates to her right hip and both her knees as well as her left shoulder.  She is worked on weight loss and activity modification.  She is taking anti-inflammatories.  She is  now diabetic.  She has had numerous steroid injections as well.  Her most recent BMI today is 32.57.  She is at the point where she does wish to discuss joint replacement surgery.  She would like to proceed first with her right hip and I agree with this as well.  She is had no recent acute change in her medical status.   HPI  Review of Systems She currently denies any headache, chest pain, shortness of breath, fever, chills, nausea  Objective: Vital Signs: Ht 5\' 6"  (1.676 m)    Wt 201 lb 12.8 oz (91.5 kg)    BMI 32.57 kg/m   Physical Exam She is alert and orient x3 and in no acute distress Ortho Exam Examination of her right  hip shows significant stiffness with internal and external rotation and significant pain on rotation.  Her left hip exam is normal.  Both knees show significant patellofemoral crepitation throughout the arc of motion.  Both knees have varus malalignme and significant medial joint line tenderness  Her left shoulder moves smoothly and fluidly but does have signs of impingement. Specialty Comments:  No specialty comments available.  Imaging: XR HIP UNILAT W OR W/O PELVIS 1V RIGHT  Result Date: 02/10/2021 An AP pelvis and lateral right hip shows severe end-stage arthritis of the right hip with joint space narrowing and peritubular osteophytes.  There is also sclerotic changes.  The left hip appears normal.  XR Knee 1-2 Views Right  Result Date: 02/10/2021 An AP and lateral of the right knee shows osteoarthritis of all 3 compartments with varus malalignment, significant medial joint space narrowing and patellofemoral narrowing.  There is osteophytes in all 3 compartments.  There is also calcifications on the medial meniscus.    PMFS History: Patient Active Problem List   Diagnosis Date Noted   Mixed hyperlipidemia 02/06/2021   Musculoskeletal chest pain 02/06/2021   Obesity (BMI 30-39.9) 02/06/2021   Unilateral primary osteoarthritis, right hip 11/06/2020   Unilateral primary osteoarthritis, right knee 11/06/2020   Chest pain of uncertain etiology 23/76/2831   Cardiac murmur 08/29/2020   Mixed dyslipidemia 08/29/2020   Arthritis 08/16/2020   Breast cancer ([place]) 08/16/2020   Headache 08/16/2020   Osteopenia 08/16/2020   Vision abnormalities 08/16/2020   Chronic right shoulder pain 03/07/2018   Chronic migraine 08/11/2017   Past Medical History:  Diagnosis Date   Arthritis    Breast cancer [facility])    age 12   Headache    Osteopenia    Vision abnormalities     Family History  Problem Relation Age of Onset   Hyperlipidemia Mother    Hypertension Mother    Alzheimer's disease Father     Hypertension Sister    Hypertension Sister    Hypertension Sister    Hypertension Brother    Lung cancer Brother    Hypertension Sister    Hypertension Brother     Past Surgical History:  Procedure Laterality Date   BACK SURGERY     lumbar region   BUNIONECTOMY Bilateral    MASTECTOMY Left    at age 15   TRIGGER FINGER RELEASE Left    left thumb   TUBAL LIGATION     Social History   Occupational History   Not on file  Tobacco Use   Smoking status: Never   Smokeless tobacco: Never  Substance and Sexual Activity   Alcohol use: Never   Drug use: Never   Sexual activity: Not Currently

## 2021-02-13 ENCOUNTER — Ambulatory Visit: Payer: Medicare Other | Admitting: Cardiology

## 2021-02-14 DIAGNOSIS — M25511 Pain in right shoulder: Secondary | ICD-10-CM | POA: Diagnosis not present

## 2021-02-14 DIAGNOSIS — M6281 Muscle weakness (generalized): Secondary | ICD-10-CM | POA: Diagnosis not present

## 2021-02-14 DIAGNOSIS — M542 Cervicalgia: Secondary | ICD-10-CM | POA: Diagnosis not present

## 2021-02-21 DIAGNOSIS — M6281 Muscle weakness (generalized): Secondary | ICD-10-CM | POA: Diagnosis not present

## 2021-02-21 DIAGNOSIS — M542 Cervicalgia: Secondary | ICD-10-CM | POA: Diagnosis not present

## 2021-02-21 DIAGNOSIS — M25511 Pain in right shoulder: Secondary | ICD-10-CM | POA: Diagnosis not present

## 2021-02-25 DIAGNOSIS — H04123 Dry eye syndrome of bilateral lacrimal glands: Secondary | ICD-10-CM | POA: Diagnosis not present

## 2021-02-25 DIAGNOSIS — H2513 Age-related nuclear cataract, bilateral: Secondary | ICD-10-CM | POA: Diagnosis not present

## 2021-02-25 DIAGNOSIS — H5203 Hypermetropia, bilateral: Secondary | ICD-10-CM | POA: Diagnosis not present

## 2021-02-25 DIAGNOSIS — H43813 Vitreous degeneration, bilateral: Secondary | ICD-10-CM | POA: Diagnosis not present

## 2021-02-28 DIAGNOSIS — M6281 Muscle weakness (generalized): Secondary | ICD-10-CM | POA: Diagnosis not present

## 2021-02-28 DIAGNOSIS — M25511 Pain in right shoulder: Secondary | ICD-10-CM | POA: Diagnosis not present

## 2021-02-28 DIAGNOSIS — M542 Cervicalgia: Secondary | ICD-10-CM | POA: Diagnosis not present

## 2021-03-05 DIAGNOSIS — M25511 Pain in right shoulder: Secondary | ICD-10-CM | POA: Diagnosis not present

## 2021-03-05 DIAGNOSIS — M542 Cervicalgia: Secondary | ICD-10-CM | POA: Diagnosis not present

## 2021-03-05 DIAGNOSIS — M6281 Muscle weakness (generalized): Secondary | ICD-10-CM | POA: Diagnosis not present

## 2021-03-14 DIAGNOSIS — M542 Cervicalgia: Secondary | ICD-10-CM | POA: Diagnosis not present

## 2021-03-14 DIAGNOSIS — M25511 Pain in right shoulder: Secondary | ICD-10-CM | POA: Diagnosis not present

## 2021-03-14 DIAGNOSIS — M6281 Muscle weakness (generalized): Secondary | ICD-10-CM | POA: Diagnosis not present

## 2021-03-17 ENCOUNTER — Other Ambulatory Visit: Payer: Self-pay

## 2021-03-21 DIAGNOSIS — M25511 Pain in right shoulder: Secondary | ICD-10-CM | POA: Diagnosis not present

## 2021-03-21 DIAGNOSIS — M6281 Muscle weakness (generalized): Secondary | ICD-10-CM | POA: Diagnosis not present

## 2021-03-21 DIAGNOSIS — M542 Cervicalgia: Secondary | ICD-10-CM | POA: Diagnosis not present

## 2021-03-28 DIAGNOSIS — M25511 Pain in right shoulder: Secondary | ICD-10-CM | POA: Diagnosis not present

## 2021-03-28 DIAGNOSIS — M6281 Muscle weakness (generalized): Secondary | ICD-10-CM | POA: Diagnosis not present

## 2021-03-28 DIAGNOSIS — M542 Cervicalgia: Secondary | ICD-10-CM | POA: Diagnosis not present

## 2021-04-03 ENCOUNTER — Telehealth: Payer: Self-pay | Admitting: *Deleted

## 2021-04-03 NOTE — Telephone Encounter (Signed)
? ?  Pre-operative Risk Assessment  ?  ?Patient Name: Anna [name]  ?DOB: September 23, 1945 ?MRN: [id number]  ? ?  ? ?Request for Surgical Clearance   ? ?Procedure:   RIGHT TOTAL HIP ARTHROPLASTY ? ?Date of Surgery:  Clearance 04/22/21                              ?   ?Surgeon:  DR. Jean Rosenthal ?Surgeon's Group or Practice Name:  [facility] ?Phone number:  [phone] ?Fax number:  [phone] ATTN: SHER[facility]E ?  ?Type of Clearance Requested:   ?- Medical  ?  ?Type of Anesthesia:  Spinal ?  ?Additional requests/questions:   ? ?Signed, ?Julaine Hua   ?04/03/2021, 4:55 PM  ? ?

## 2021-04-04 NOTE — Telephone Encounter (Signed)
? ?  Primary Cardiologist: Berniece Salines, DO ? ?Chart reviewed as part of pre-operative protocol coverage. Given past medical history and time since last visit, based on ACC/AHA guidelines, Anna [name] would be at acceptable risk for the planned procedure without further cardiovascular testing.  ? ?Her RCRI is a class I risk, 0.4% risk of major cardiac event.   ? ?I will route this recommendation to the requesting party via Epic fax function and remove from pre-op pool. ? ?Please call with questions. ? ?Jossie Ng. Clem Wisenbaker NP-C ? ?  ?04/04/2021, 11:22 AM ?[place] ?[address] ?Office [phone] Fax [phone] ? ? ? ? ?

## 2021-04-07 ENCOUNTER — Other Ambulatory Visit: Payer: Self-pay | Admitting: Physician Assistant

## 2021-04-07 DIAGNOSIS — M1611 Unilateral primary osteoarthritis, right hip: Secondary | ICD-10-CM

## 2021-04-17 NOTE — Pre-Procedure Instructions (Signed)
Surgical Instructions ? ? ? Your procedure is scheduled on Tuesday, March 21. ? Report to [facility] Main Entrance "A" at 5:30 A.M., then check in with the Admitting office. ? Call this number if you have problems the morning of surgery: ? [phone] ? ? If you have any questions prior to your surgery date call [phone]: Open Monday-Friday 8am-4pm ? ? ? Remember: ? Do not eat after midnight the night before your surgery ? ?You may drink clear liquids until 4:30AM the morning of your surgery.   ?Clear liquids allowed are: Water, Non-Citrus Juices (without pulp), Carbonated Beverages, Clear Tea, Black Coffee ONLY (NO MILK, CREAM OR POWDERED CREAMER of any kind), and Gatorade ? ?Patient Instructions ? ?The night before surgery:  ?No food after midnight. ONLY clear liquids after midnight ? ?The day of surgery (if you do NOT have diabetes):  ?Drink ONE (1) Pre-Surgery Clear Ensure by 4:30AM the morning of surgery. Drink in one sitting. Do not sip.  ?This drink was given to you during your hospital  ?pre-op appointment visit. ? ?Nothing else to drink after completing the  ?Pre-Surgery Clear Ensure. ? ? ?       If you have questions, please contact your surgeon?s office. ? ?  ? Take these medicines the morning of surgery with A SIP OF WATER:  ? ?Acetaminophen (tylenol) ?levothyroxine (SYNTHROID)  ?omeprazole (PRILOSEC)  ?pravastatin (PRAVACHOL) ? ?As of today, STOP taking any Aspirin (unless otherwise instructed by your surgeon) Aleve, Naproxen, Ibuprofen, Motrin, Advil, Goody's, BC's, all herbal medications, fish oil, and all vitamins. ? ?         ?Do not wear jewelry or makeup ?Do not wear lotions, powders, perfumes/colognes, or deodorant. ?Do not shave 48 hours prior to surgery.  Men may shave face and neck. ?Do not bring valuables to the hospital. ?Do not wear nail polish, gel polish, artificial nails, or any other type of covering on natural nails (fingers and toes) ?If you have artificial nails or gel coating  that need to be removed by a nail salon, please have this removed prior to surgery. Artificial nails or gel coating may interfere with anesthesia's ability to adequately monitor your vital signs. ? ?[facility] is not responsible for any belongings or valuables. .  ? ?Do NOT Smoke (Tobacco/Vaping)  24 hours prior to your procedure ? ?If you use a CPAP at night, you may bring your mask for your overnight stay. ?  ?Contacts, glasses, hearing aids, dentures or partials may not be worn into surgery, please bring cases for these belongings ?  ?For patients admitted to the hospital, discharge time will be determined by your treatment team. ?  ?Patients discharged the day of surgery will not be allowed to drive home, and someone needs to stay with them for 24 hours. ? ?NO VISITORS WILL BE ALLOWED IN PRE-OP WHERE PATIENTS ARE PREPPED FOR SURGERY.  ONLY 1 SUPPORT PERSON MAY BE PRESENT IN THE WAITING ROOM WHILE YOU ARE IN SURGERY.  IF YOU ARE TO BE ADMITTED, ONCE YOU ARE IN YOUR ROOM YOU WILL BE ALLOWED TWO (2) VISITORS. 1 (ONE) VISITOR MAY STAY OVERNIGHT BUT MUST ARRIVE TO THE ROOM BY 8pm.  Minor children may have two parents present. Special consideration for safety and communication needs will be reviewed on a case by case basis. ? ?Special instructions:   ? ?Oral Hygiene is also important to reduce your risk of infection.  Remember - BRUSH YOUR TEETH THE MORNING OF SURGERY WITH YOUR  REGULAR TOOTHPASTE ? ? ?[facility]- Preparing For Surgery ? ?Before surgery, you can play an important role. Because skin is not sterile, your skin needs to be as free of germs as possible. You can reduce the number of germs on your skin by washing with CHG (chlorahexidine gluconate) Soap before surgery.  CHG is an antiseptic cleaner which kills germs and bonds with the skin to continue killing germs even after washing.   ? ? ?Please do not use if you have an allergy to CHG or antibacterial soaps. If your skin becomes reddened/irritated stop  using the CHG.  ?Do not shave (including legs and underarms) for at least 48 hours prior to first CHG shower. It is OK to shave your face. ? ?Please follow these instructions carefully. ?  ? ? Shower the NIGHT BEFORE SURGERY and the MORNING OF SURGERY with CHG Soap.  ? If you chose to wash your hair, wash your hair first as usual with your normal shampoo. After you shampoo, rinse your hair and body thoroughly to remove the shampoo.  Then [employer] and genitals (private parts) with your normal soap and rinse thoroughly to remove soap. ? ?After that Use CHG Soap as you would any other liquid soap. You can apply CHG directly to the skin and wash gently with a scrungie or a clean washcloth.  ? ?Apply the CHG Soap to your body ONLY FROM THE NECK DOWN.  Do not use on open wounds or open sores. Avoid contact with your eyes, ears, mouth and genitals (private parts). Wash Face and genitals (private parts)  with your normal soap.  ? ?Wash thoroughly, paying special attention to the area where your surgery will be performed. ? ?Thoroughly rinse your body with warm water from the neck down. ? ?DO NOT shower/wash with your normal soap after using and rinsing off the CHG Soap. ? ?Pat yourself dry with a CLEAN TOWEL. ? ?Wear CLEAN PAJAMAS to bed the night before surgery ? ?Place CLEAN SHEETS on your bed the night before your surgery ? ?DO NOT SLEEP WITH PETS. ? ? ?Day of Surgery: ? ?Take a shower with CHG soap. ?Wear Clean/Comfortable clothing the morning of surgery ?Do not apply any deodorants/lotions.   ?Remember to brush your teeth WITH YOUR REGULAR TOOTHPASTE. ? ? ? ?COVID testing ? ?If you are going to stay overnight or be admitted after your procedure/surgery and require a pre-op COVID test, please follow these instructions after your COVID test  ? ?You are not required to quarantine however you are required to wear a well-fitting mask when you are out and around people not in your household.  If your mask becomes wet or  soiled, replace with a new one. ? ?Wash your hands often with soap and water for 20 seconds or clean your hands with an alcohol-based hand sanitizer that contains at least 60% alcohol. ? ?Do not share personal items. ? ?Notify your provider: ?if you are in close contact with someone who has COVID  ?or if you develop a fever of 100.4 or greater, sneezing, cough, sore throat, shortness of breath or body aches. ? ?  ?Please read over the following fact sheets that you were given.  ? ?

## 2021-04-18 ENCOUNTER — Encounter (HOSPITAL_COMMUNITY): Payer: Self-pay

## 2021-04-18 ENCOUNTER — Other Ambulatory Visit: Payer: Self-pay

## 2021-04-18 ENCOUNTER — Encounter (HOSPITAL_COMMUNITY)
Admission: RE | Admit: 2021-04-18 | Discharge: 2021-04-18 | Disposition: A | Discharge status: Home / Self Care | Payer: Medicare Other | Source: Ambulatory Visit | Attending: Orthopaedic Surgery | Admitting: Orthopaedic Surgery

## 2021-04-18 VITALS — BP 132/68 | HR 69 | Temp 97.9°F | Resp 18 | Ht 66.0 in | Wt 196.8 lb

## 2021-04-18 DIAGNOSIS — E782 Mixed hyperlipidemia: Secondary | ICD-10-CM | POA: Diagnosis not present

## 2021-04-18 DIAGNOSIS — Z01818 Encounter for other preprocedural examination: Secondary | ICD-10-CM

## 2021-04-18 DIAGNOSIS — Z01812 Encounter for preprocedural laboratory examination: Secondary | ICD-10-CM | POA: Diagnosis not present

## 2021-04-18 DIAGNOSIS — M1611 Unilateral primary osteoarthritis, right hip: Secondary | ICD-10-CM | POA: Diagnosis not present

## 2021-04-18 HISTORY — DX: Family history of other specified conditions: Z84.89

## 2021-04-18 HISTORY — DX: Hyperlipidemia, unspecified: E78.5

## 2021-04-18 LAB — CBC
HCT: 37.2 % (ref 36.0–46.0)
Hemoglobin: 12 g/dL (ref 12.0–15.0)
MCH: 30.6 pg (ref 26.0–34.0)
MCHC: 32.3 g/dL (ref 30.0–36.0)
MCV: 94.9 fL (ref 80.0–100.0)
Platelets: 235 10*3/uL (ref 150–400)
RBC: 3.92 MIL/uL (ref 3.87–5.11)
RDW: 13.1 % (ref 11.5–15.5)
WBC: 5.4 10*3/uL (ref 4.0–10.5)
nRBC: 0 % (ref 0.0–0.2)

## 2021-04-18 LAB — TYPE AND SCREEN
ABO/RH(D): A POS
Antibody Screen: NEGATIVE

## 2021-04-18 LAB — SURGICAL PCR SCREEN
MRSA, PCR: NEGATIVE
Staphylococcus aureus: NEGATIVE

## 2021-04-18 NOTE — Progress Notes (Signed)
PCP - [name], MD ?Cardiologist - Berniece Salines, DO with [facility] ? ?Chest x-ray - Not indicated ?EKG - 11/29/20 ?Stress Test - 09/03/20 ?ECHO - 09/11/20 ?Cardiac Cath - Denies ? ?Sleep Study - Denies ? ?DM = Denies ? ?ERAS Protcol -Yes ?PRE-SURGERY Ensure given  ? ?COVID TEST- Not indicated ? ? ?Anesthesia review: Yes cardiac history ? ?Patient denies shortness of breath, fever, cough and chest pain at PAT appointment ? ? ?All instructions explained to the patient, with a verbal understanding of the material. Patient agrees to go over the instructions while at home for a better understanding.  The opportunity to ask questions was provided. ? ? ?

## 2021-04-21 ENCOUNTER — Telehealth: Payer: Self-pay | Admitting: *Deleted

## 2021-04-21 NOTE — Progress Notes (Signed)
Anesthesia Chart Review: ? ?Patient evaluated by cardiology in August 2022 for complaint of chest pain.  Nuclear stress was low risk, nonischemic.  Echocardiogram showed EF 60 to 65%, grade 1 DD, no significant valvular abnormalities.  Cardiac clearance per telephone encounter 04/04/2021, "Chart reviewed as part of pre-operative protocol coverage. Given past medical history and time since last visit, based on ACC/AHA guidelines, Anna [name] would be at acceptable risk for the planned procedure without further cardiovascular testing. Her RCRI is a class I risk, 0.4% risk of major cardiac event." ? ?Preop labs reviewed, unremarkable. ? ?EKG 11/28/2020: NSR with sinus arrhythmia.  Rate 70.  Nonspecific ST and T wave abnormality. ? ?TTE 09/11/2020: ? 1. Left ventricular ejection fraction, by estimation, is 60 to 65%. The  ?left ventricle has normal function. The left ventricle has no regional  ?wall motion abnormalities. There is mild left ventricular hypertrophy.  ?Left ventricular diastolic parameters  ?are consistent with Grade I diastolic dysfunction (impaired relaxation).  ? 2. Right ventricular systolic function is normal. The right ventricular  ?size is normal. There is normal pulmonary artery systolic pressure. The  ?estimated right ventricular systolic pressure is 50.9 mmHg.  ? 3. Left atrial size was mildly dilated.  ? 4. The mitral valve is normal in structure. Trivial mitral valve  ?regurgitation. No evidence of mitral stenosis.  ? 5. The aortic valve is tricuspid. Aortic valve regurgitation is not  ?visualized. No aortic stenosis is present.  ? 6. The inferior vena cava is normal in size with greater than 50%  ?respiratory variability, suggesting right atrial pressure of 3 mmHg.  ? ?Nuclear stress 09/03/2020: ?Nuclear stress EF: 57%. The left ventricular ejection fraction is normal (55-65%). ?This is a low risk study. There is no evidence of ischemia and no evidence of previous infarction. ?The study is  normal. ? ? ?Karoline Caldwell, PA-C ?[facility] Short Stay [facility]/Anesthesiology ?Phone [phone] ?04/21/2021 12:40 PM ? ?

## 2021-04-21 NOTE — Care Plan (Signed)
[facility] RNCM call to patient prior to her Right total hip arthroplasty with Dr. Ninfa Linden on 04/22/21 to discuss that she is an Ortho bundle patient through [facility]. She is agreeable to case management. She has a daughter that will be assisting her as well as friends after surgery to help at her home. She will need a RW and 3in1/BSC. These will be ordered through [facility] to be provided at hospital prior to discharge. Anticipate HHPT will be needed after a short hospital stay. Choice provided and referral sent to [facility], who have accepted patient. Reviewed all post op care instructions. Will continue to follow for needs. ?

## 2021-04-21 NOTE — Telephone Encounter (Signed)
Ortho bundle pre-op call completed. 

## 2021-04-21 NOTE — Anesthesia Preprocedure Evaluation (Addendum)
Anesthesia Evaluation  ?Patient identified by MRN, date of birth, ID band ?Patient awake ? ? ? ?Reviewed: ?Allergy & Precautions, NPO status , Patient's Chart, lab work & pertinent test results ? ?Airway ?Mallampati: II ? ?TM Distance: >3 FB ?Neck ROM: Full ? ? ? Dental ? ?(+) Teeth Intact, Dental Advisory Given ?  ?Pulmonary ?neg pulmonary ROS,  ?  ?Pulmonary exam normal ?breath sounds clear to auscultation ? ? ? ? ? ? Cardiovascular ?negative cardio ROS ?Normal cardiovascular exam ?Rhythm:Regular Rate:Normal ? ? ?  ?Neuro/Psych ? Headaches, negative psych ROS  ? GI/Hepatic ?Neg liver ROS, GERD  Medicated,  ?Endo/Other  ?Hypothyroidism Obesity ? ? Renal/GU ?negative Renal ROS  ? ?  ?Musculoskeletal ? ?(+) Arthritis ,  ? Abdominal ?  ?Peds ? Hematology ?negative hematology ROS ?(+) Plt 235k   ?Anesthesia Other Findings ?H/o breast cancer ? ? Reproductive/Obstetrics ? ?  ? ? ? ? ? ? ? ? ? ? ? ? ? ?  ?  ? ? ? ? ? ? ? ?Anesthesia Physical ?Anesthesia Plan ? ?ASA: 2 ? ?Anesthesia Plan: Spinal  ? ?Post-op Pain Management: Tylenol PO (pre-op)*  ? ?Induction: Intravenous ? ?PONV Risk Score and Plan: 2 and TIVA, Dexamethasone and Ondansetron ? ?Airway Management Planned: Natural Airway and Nasal Cannula ? ?Additional Equipment:  ? ?Intra-op Plan:  ? ?Post-operative Plan:  ? ?Informed Consent: I have reviewed the patients History and Physical, chart, labs and discussed the procedure including the risks, benefits and alternatives for the proposed anesthesia with the patient or authorized representative who has indicated his/her understanding and acceptance.  ? ? ? ?Dental advisory given ? ?Plan Discussed with: CRNA ? ?Anesthesia Plan Comments: (PAT note by Karoline Caldwell, PA-C: ?Patient evaluated by cardiology in August 2022 for complaint of chest pain.  Nuclear stress was low risk, nonischemic.  Echocardiogram showed EF 60 to 65%, grade 1 DD, no significant valvular abnormalities.  Cardiac  clearance per telephone encounter 04/04/2021, "Chart reviewed as part of pre-operative protocol coverage. Given past medical history and time since last visit, based on ACC/AHA guidelines,?Tashi E Quakenbush?would be at acceptable risk for the planned procedure without further cardiovascular testing.?Her RCRI is a class I risk, 0.4% risk of major cardiac event." ? ?Preop labs reviewed, unremarkable. ? ?EKG 11/28/2020: NSR with sinus arrhythmia.  Rate 70.  Nonspecific ST and T wave abnormality. ? ?TTE 09/11/2020: ??1. Left ventricular ejection fraction, by estimation, is 60 to 65%. The  ?left ventricle has normal function. The left ventricle has no regional  ?wall motion abnormalities. There is mild left ventricular hypertrophy.  ?Left ventricular diastolic parameters  ?are consistent with Grade I diastolic dysfunction (impaired relaxation).  ??2. Right ventricular systolic function is normal. The right ventricular  ?size is normal. There is normal pulmonary artery systolic pressure. The  ?estimated right ventricular systolic pressure is 82.9 mmHg.  ??3. Left atrial size was mildly dilated.  ??4. The mitral valve is normal in structure. Trivial mitral valve  ?regurgitation. No evidence of mitral stenosis.  ??5. The aortic valve is tricuspid. Aortic valve regurgitation is not  ?visualized. No aortic stenosis is present.  ??6. The inferior vena cava is normal in size with greater than 50%  ?respiratory variability, suggesting right atrial pressure of 3 mmHg.  ? ?Nuclear stress 09/03/2020: ?? Nuclear stress EF: 57%. The left ventricular ejection fraction is normal (55-65%). ?? This is a low risk study. There is no evidence of ischemia and no evidence of previous infarction. ?? The study  is normal. ? ?)  ? ? ? ? ?Anesthesia Quick Evaluation ? ?

## 2021-04-22 ENCOUNTER — Ambulatory Visit (HOSPITAL_COMMUNITY): Payer: Medicare Other

## 2021-04-22 ENCOUNTER — Observation Stay (HOSPITAL_COMMUNITY): Payer: Medicare Other

## 2021-04-22 ENCOUNTER — Encounter (HOSPITAL_COMMUNITY)
Admission: RE | Disposition: A | Discharge status: Home Health | Payer: Self-pay | Source: Ambulatory Visit | Attending: Orthopaedic Surgery

## 2021-04-22 ENCOUNTER — Ambulatory Visit (HOSPITAL_BASED_OUTPATIENT_CLINIC_OR_DEPARTMENT_OTHER): Payer: Medicare Other | Admitting: Anesthesiology

## 2021-04-22 ENCOUNTER — Other Ambulatory Visit: Payer: Self-pay

## 2021-04-22 ENCOUNTER — Encounter (HOSPITAL_COMMUNITY): Payer: Self-pay | Admitting: Orthopaedic Surgery

## 2021-04-22 ENCOUNTER — Observation Stay (HOSPITAL_COMMUNITY)
Admission: RE | Admit: 2021-04-22 | Discharge: 2021-04-24 | Disposition: A | Discharge status: Home Health | Payer: Medicare Other | Source: Ambulatory Visit | Attending: Orthopaedic Surgery | Admitting: Orthopaedic Surgery

## 2021-04-22 ENCOUNTER — Ambulatory Visit (HOSPITAL_COMMUNITY): Payer: Medicare Other | Admitting: Physician Assistant

## 2021-04-22 DIAGNOSIS — Z79899 Other long term (current) drug therapy: Secondary | ICD-10-CM | POA: Insufficient documentation

## 2021-04-22 DIAGNOSIS — Z853 Personal history of malignant neoplasm of breast: Secondary | ICD-10-CM | POA: Diagnosis not present

## 2021-04-22 DIAGNOSIS — M1611 Unilateral primary osteoarthritis, right hip: Secondary | ICD-10-CM | POA: Diagnosis not present

## 2021-04-22 DIAGNOSIS — Z96641 Presence of right artificial hip joint: Secondary | ICD-10-CM | POA: Diagnosis not present

## 2021-04-22 DIAGNOSIS — Z471 Aftercare following joint replacement surgery: Secondary | ICD-10-CM | POA: Diagnosis not present

## 2021-04-22 HISTORY — PX: TOTAL HIP ARTHROPLASTY: SHX124

## 2021-04-22 LAB — ABO/RH: ABO/RH(D): A POS

## 2021-04-22 SURGERY — ARTHROPLASTY, HIP, TOTAL, ANTERIOR APPROACH
Anesthesia: Spinal | Site: Hip | Laterality: Right

## 2021-04-22 MED ORDER — CEFAZOLIN SODIUM-DEXTROSE 1-4 GM/50ML-% IV SOLN
1.0000 g | Freq: Four times a day (QID) | INTRAVENOUS | Status: AC
  Administered 2021-04-22 (×2): 1 g via INTRAVENOUS
  Filled 2021-04-22 (×2): qty 50

## 2021-04-22 MED ORDER — METHOCARBAMOL 500 MG PO TABS
500.0000 mg | ORAL_TABLET | Freq: Four times a day (QID) | ORAL | Status: DC | PRN
  Administered 2021-04-22 – 2021-04-24 (×6): 500 mg via ORAL
  Filled 2021-04-22 (×6): qty 1

## 2021-04-22 MED ORDER — DEXAMETHASONE SODIUM PHOSPHATE 10 MG/ML IJ SOLN
INTRAMUSCULAR | Status: DC | PRN
  Administered 2021-04-22: 10 mg via INTRAVENOUS

## 2021-04-22 MED ORDER — ALUM & MAG HYDROXIDE-SIMETH 200-200-20 MG/5ML PO SUSP: 30.0000 mL | ORAL | Status: DC | PRN

## 2021-04-22 MED ORDER — FERROUS SULFATE 325 (65 FE) MG PO TABS
324.0000 mg | ORAL_TABLET | Freq: Every day | ORAL | Status: DC
  Administered 2021-04-23 – 2021-04-24 (×2): 324 mg via ORAL
  Filled 2021-04-22 (×2): qty 1

## 2021-04-22 MED ORDER — LACTATED RINGERS IV SOLN
INTRAVENOUS | Status: DC
Start: 2021-04-22 — End: 2021-04-22

## 2021-04-22 MED ORDER — FENTANYL CITRATE (PF) 100 MCG/2ML IJ SOLN
25.0000 ug | INTRAMUSCULAR | Status: DC | PRN
  Administered 2021-04-22 (×3): 50 ug via INTRAVENOUS

## 2021-04-22 MED ORDER — CEFAZOLIN SODIUM-DEXTROSE 2-4 GM/100ML-% IV SOLN
2.0000 g | INTRAVENOUS | Status: AC
  Administered 2021-04-22: 2 g via INTRAVENOUS
  Filled 2021-04-22: qty 100

## 2021-04-22 MED ORDER — GLYCOPYRROLATE PF 0.2 MG/ML IJ SOSY
PREFILLED_SYRINGE | INTRAMUSCULAR | Status: AC
  Filled 2021-04-22: qty 1

## 2021-04-22 MED ORDER — POVIDONE-IODINE 10 % EX SWAB
2.0000 "application " | Freq: Once | CUTANEOUS | Status: AC
  Administered 2021-04-22: 2 via TOPICAL

## 2021-04-22 MED ORDER — HYDROMORPHONE HCL 1 MG/ML IJ SOLN
0.5000 mg | INTRAMUSCULAR | Status: DC | PRN
  Administered 2021-04-22: 1 mg via INTRAVENOUS
  Administered 2021-04-22: 0.5 mg via INTRAVENOUS
  Filled 2021-04-22 (×2): qty 1

## 2021-04-22 MED ORDER — PROPOFOL 500 MG/50ML IV EMUL
INTRAVENOUS | Status: DC | PRN
  Administered 2021-04-22: 100 ug/kg/min via INTRAVENOUS

## 2021-04-22 MED ORDER — DIPHENHYDRAMINE HCL 12.5 MG/5ML PO ELIX
12.5000 mg | ORAL_SOLUTION | ORAL | Status: DC | PRN
  Filled 2021-04-22: qty 10

## 2021-04-22 MED ORDER — CHLORHEXIDINE GLUCONATE 0.12 % MT SOLN
15.0000 mL | Freq: Once | OROMUCOSAL | Status: AC
  Administered 2021-04-22: 15 mL via OROMUCOSAL
  Filled 2021-04-22: qty 15

## 2021-04-22 MED ORDER — ORAL CARE MOUTH RINSE: 15.0000 mL | Freq: Once | OROMUCOSAL | Status: AC

## 2021-04-22 MED ORDER — PANTOPRAZOLE SODIUM 40 MG PO TBEC
40.0000 mg | DELAYED_RELEASE_TABLET | Freq: Every day | ORAL | Status: DC
  Administered 2021-04-23 – 2021-04-24 (×2): 40 mg via ORAL
  Filled 2021-04-22 (×3): qty 1

## 2021-04-22 MED ORDER — 0.9 % SODIUM CHLORIDE (POUR BTL) OPTIME
TOPICAL | Status: DC | PRN
  Administered 2021-04-22: 1000 mL

## 2021-04-22 MED ORDER — BUPIVACAINE IN DEXTROSE 0.75-8.25 % IT SOLN
INTRATHECAL | Status: DC | PRN
  Administered 2021-04-22: 1.6 mL via INTRATHECAL

## 2021-04-22 MED ORDER — ONDANSETRON HCL 4 MG/2ML IJ SOLN: 4.0000 mg | Freq: Four times a day (QID) | INTRAMUSCULAR | Status: DC | PRN

## 2021-04-22 MED ORDER — PHENOL 1.4 % MT LIQD
1.0000 | OROMUCOSAL | Status: DC | PRN
  Administered 2021-04-22: 1 via OROMUCOSAL
  Filled 2021-04-22: qty 177

## 2021-04-22 MED ORDER — EPHEDRINE SULFATE-NACL 50-0.9 MG/10ML-% IV SOSY
PREFILLED_SYRINGE | INTRAVENOUS | Status: DC | PRN
  Administered 2021-04-22 (×4): 5 mg via INTRAVENOUS

## 2021-04-22 MED ORDER — EPHEDRINE 5 MG/ML INJ
INTRAVENOUS | Status: AC
  Filled 2021-04-22: qty 5

## 2021-04-22 MED ORDER — OXYCODONE HCL 5 MG PO TABS
10.0000 mg | ORAL_TABLET | ORAL | Status: DC | PRN
  Administered 2021-04-22 – 2021-04-23 (×5): 10 mg via ORAL
  Filled 2021-04-22: qty 2

## 2021-04-22 MED ORDER — FENTANYL CITRATE (PF) 100 MCG/2ML IJ SOLN
INTRAMUSCULAR | Status: AC
  Filled 2021-04-22: qty 2

## 2021-04-22 MED ORDER — METOCLOPRAMIDE HCL 5 MG/ML IJ SOLN: 5.0000 mg | Freq: Three times a day (TID) | INTRAMUSCULAR | Status: DC | PRN

## 2021-04-22 MED ORDER — ACETAMINOPHEN 500 MG PO TABS
1000.0000 mg | ORAL_TABLET | Freq: Once | ORAL | Status: AC
Start: 2021-04-22 — End: 2021-04-22
  Administered 2021-04-22: 500 mg via ORAL
  Filled 2021-04-22: qty 2

## 2021-04-22 MED ORDER — TRANEXAMIC ACID-NACL 1000-0.7 MG/100ML-% IV SOLN
1000.0000 mg | INTRAVENOUS | Status: AC
  Administered 2021-04-22: 1000 mg via INTRAVENOUS
  Filled 2021-04-22: qty 100

## 2021-04-22 MED ORDER — MENTHOL 3 MG MT LOZG: 1.0000 | LOZENGE | OROMUCOSAL | Status: DC | PRN

## 2021-04-22 MED ORDER — ONDANSETRON HCL 4 MG/2ML IJ SOLN
INTRAMUSCULAR | Status: AC
  Filled 2021-04-22: qty 2

## 2021-04-22 MED ORDER — SODIUM CHLORIDE 0.9 % IV SOLN: INTRAVENOUS | Status: DC

## 2021-04-22 MED ORDER — SODIUM CHLORIDE 0.9 % IR SOLN
Status: DC | PRN
  Administered 2021-04-22: 1000 mL

## 2021-04-22 MED ORDER — PRAVASTATIN SODIUM 40 MG PO TABS
40.0000 mg | ORAL_TABLET | Freq: Every day | ORAL | Status: DC
  Administered 2021-04-23 – 2021-04-24 (×2): 40 mg via ORAL
  Filled 2021-04-22 (×2): qty 1

## 2021-04-22 MED ORDER — ONDANSETRON HCL 4 MG/2ML IJ SOLN: 4.0000 mg | Freq: Once | INTRAMUSCULAR | Status: DC | PRN

## 2021-04-22 MED ORDER — ONDANSETRON HCL 4 MG PO TABS: 4.0000 mg | ORAL_TABLET | Freq: Four times a day (QID) | ORAL | Status: DC | PRN

## 2021-04-22 MED ORDER — OYSTER SHELL CALCIUM/D3 500-5 MG-MCG PO TABS
1.0000 | ORAL_TABLET | Freq: Every day | ORAL | Status: DC
  Administered 2021-04-22 – 2021-04-24 (×3): 1 via ORAL
  Filled 2021-04-22 (×3): qty 1

## 2021-04-22 MED ORDER — FAMOTIDINE 20 MG PO TABS
20.0000 mg | ORAL_TABLET | Freq: Every day | ORAL | Status: DC
  Administered 2021-04-22 – 2021-04-23 (×2): 20 mg via ORAL
  Filled 2021-04-22 (×2): qty 1

## 2021-04-22 MED ORDER — METHOCARBAMOL 1000 MG/10ML IJ SOLN
500.0000 mg | Freq: Four times a day (QID) | INTRAVENOUS | Status: DC | PRN
  Filled 2021-04-22: qty 5

## 2021-04-22 MED ORDER — ACETAMINOPHEN 325 MG PO TABS: 325.0000 mg | ORAL_TABLET | Freq: Four times a day (QID) | ORAL | Status: DC | PRN

## 2021-04-22 MED ORDER — DOCUSATE SODIUM 100 MG PO CAPS
100.0000 mg | ORAL_CAPSULE | Freq: Two times a day (BID) | ORAL | Status: DC
  Administered 2021-04-22 – 2021-04-24 (×5): 100 mg via ORAL
  Filled 2021-04-22 (×5): qty 1

## 2021-04-22 MED ORDER — DEXAMETHASONE SODIUM PHOSPHATE 10 MG/ML IJ SOLN
INTRAMUSCULAR | Status: AC
  Filled 2021-04-22: qty 1

## 2021-04-22 MED ORDER — ASPIRIN 81 MG PO CHEW
81.0000 mg | CHEWABLE_TABLET | Freq: Two times a day (BID) | ORAL | Status: DC
  Administered 2021-04-22 – 2021-04-24 (×4): 81 mg via ORAL
  Filled 2021-04-22 (×4): qty 1

## 2021-04-22 MED ORDER — LEVOTHYROXINE SODIUM 100 MCG PO TABS
100.0000 ug | ORAL_TABLET | Freq: Every day | ORAL | Status: DC
  Administered 2021-04-23 – 2021-04-24 (×2): 100 ug via ORAL
  Filled 2021-04-22 (×2): qty 1

## 2021-04-22 MED ORDER — ONDANSETRON HCL 4 MG/2ML IJ SOLN
INTRAMUSCULAR | Status: DC | PRN
  Administered 2021-04-22: 4 mg via INTRAVENOUS

## 2021-04-22 MED ORDER — VITAMIN D 25 MCG (1000 UNIT) PO TABS
5000.0000 [IU] | ORAL_TABLET | Freq: Every day | ORAL | Status: DC
  Administered 2021-04-22 – 2021-04-24 (×3): 5000 [IU] via ORAL
  Filled 2021-04-22 (×3): qty 5

## 2021-04-22 MED ORDER — GLYCOPYRROLATE 0.2 MG/ML IJ SOLN
INTRAMUSCULAR | Status: DC | PRN
  Administered 2021-04-22: .1 mg via INTRAVENOUS

## 2021-04-22 MED ORDER — OXYCODONE HCL 5 MG PO TABS
5.0000 mg | ORAL_TABLET | ORAL | Status: DC | PRN
  Administered 2021-04-23 – 2021-04-24 (×5): 10 mg via ORAL
  Filled 2021-04-22 (×9): qty 2

## 2021-04-22 MED ORDER — METOCLOPRAMIDE HCL 5 MG PO TABS: 5.0000 mg | ORAL_TABLET | Freq: Three times a day (TID) | ORAL | Status: DC | PRN

## 2021-04-22 SURGICAL SUPPLY — 60 items
BAG COUNTER SPONGE SURGICOUNT (BAG) ×2 IMPLANT
BENZOIN TINCTURE PRP APPL 2/3 (GAUZE/BANDAGES/DRESSINGS) ×1 IMPLANT
BLADE CLIPPER SURG (BLADE) IMPLANT
BLADE SAW SGTL 18X1.27X75 (BLADE) ×2 IMPLANT
BNDG COHESIVE 3X5 TAN ST LF (GAUZE/BANDAGES/DRESSINGS) ×1 IMPLANT
CATH FOLEY 2WAY SLVR  5CC 14FR (CATHETERS) ×2
CATH FOLEY 2WAY SLVR 5CC 14FR (CATHETERS) IMPLANT
COVER SURGICAL LIGHT HANDLE (MISCELLANEOUS) ×2 IMPLANT
CUP SECTOR GRIPTON 50MM (Cup) ×1 IMPLANT
DRAPE C-ARM 42X72 X-RAY (DRAPES) ×2 IMPLANT
DRAPE STERI IOBAN 125X83 (DRAPES) ×2 IMPLANT
DRAPE U-SHAPE 47X51 STRL (DRAPES) ×6 IMPLANT
DRSG AQUACEL AG ADV 3.5X10 (GAUZE/BANDAGES/DRESSINGS) ×2 IMPLANT
DURAPREP 26ML APPLICATOR (WOUND CARE) ×2 IMPLANT
ELECT BLADE 4.0 EZ CLEAN MEGAD (MISCELLANEOUS) ×2
ELECT BLADE 6.5 EXT (BLADE) IMPLANT
ELECT REM PT RETURN 9FT ADLT (ELECTROSURGICAL) ×2
ELECTRODE BLDE 4.0 EZ CLN MEGD (MISCELLANEOUS) ×1 IMPLANT
ELECTRODE REM PT RTRN 9FT ADLT (ELECTROSURGICAL) ×1 IMPLANT
FACESHIELD WRAPAROUND (MASK) ×4 IMPLANT
FACESHIELD WRAPAROUND OR TEAM (MASK) ×2 IMPLANT
GAUZE XEROFORM 1X8 LF (GAUZE/BANDAGES/DRESSINGS) ×1 IMPLANT
GLOVE SRG 8 PF TXTR STRL LF DI (GLOVE) ×2 IMPLANT
GLOVE SURG LTX SZ8 (GLOVE) ×2 IMPLANT
GLOVE SURG ORTHO LTX SZ7.5 (GLOVE) ×4 IMPLANT
GLOVE SURG UNDER POLY LF SZ8 (GLOVE) ×4
GOWN STRL REUS W/ TWL LRG LVL3 (GOWN DISPOSABLE) ×2 IMPLANT
GOWN STRL REUS W/ TWL XL LVL3 (GOWN DISPOSABLE) ×2 IMPLANT
GOWN STRL REUS W/TWL LRG LVL3 (GOWN DISPOSABLE) ×4
GOWN STRL REUS W/TWL XL LVL3 (GOWN DISPOSABLE) ×4
HANDPIECE INTERPULSE COAX TIP (DISPOSABLE) ×2
HEAD FEM STD 32X+1 STRL (Hips) IMPLANT
HEAD FEM STD 32X+5 STRL (Hips) ×1 IMPLANT
KIT BASIN OR (CUSTOM PROCEDURE TRAY) ×2 IMPLANT
KIT TURNOVER KIT B (KITS) ×2 IMPLANT
LINER ACET PNNCL PLUS4 NEUTRAL (Hips) IMPLANT
MANIFOLD NEPTUNE II (INSTRUMENTS) ×2 IMPLANT
NS IRRIG 1000ML POUR BTL (IV SOLUTION) ×2 IMPLANT
PACK TOTAL JOINT (CUSTOM PROCEDURE TRAY) ×2 IMPLANT
PACK UNIVERSAL I (CUSTOM PROCEDURE TRAY) ×1 IMPLANT
PAD ARMBOARD 7.5X6 YLW CONV (MISCELLANEOUS) ×2 IMPLANT
PINNACLE PLUS 4 NEUTRAL (Hips) ×2 IMPLANT
SET HNDPC FAN SPRY TIP SCT (DISPOSABLE) ×1 IMPLANT
STAPLER VISISTAT 35W (STAPLE) ×1 IMPLANT
STEM FEM SZ3 STD ACTIS (Stem) ×1 IMPLANT
STRIP CLOSURE SKIN 1/2X4 (GAUZE/BANDAGES/DRESSINGS) ×2 IMPLANT
SUT ETHIBOND NAB CT1 #1 30IN (SUTURE) ×2 IMPLANT
SUT MNCRL AB 4-0 PS2 18 (SUTURE) IMPLANT
SUT VIC AB 0 CT1 27 (SUTURE) ×2
SUT VIC AB 0 CT1 27XBRD ANBCTR (SUTURE) ×1 IMPLANT
SUT VIC AB 1 CT1 27 (SUTURE)
SUT VIC AB 1 CT1 27XBRD ANBCTR (SUTURE) ×1 IMPLANT
SUT VIC AB 2-0 CT1 27 (SUTURE) ×4
SUT VIC AB 2-0 CT1 TAPERPNT 27 (SUTURE) ×1 IMPLANT
TOWEL GREEN STERILE (TOWEL DISPOSABLE) ×2 IMPLANT
TOWEL GREEN STERILE FF (TOWEL DISPOSABLE) ×2 IMPLANT
TRAY CATH 16FR W/PLASTIC CATH (SET/KITS/TRAYS/PACK) IMPLANT
TRAY FOLEY W/BAG SLVR 16FR (SET/KITS/TRAYS/PACK) ×2
TRAY FOLEY W/BAG SLVR 16FR ST (SET/KITS/TRAYS/PACK) IMPLANT
WATER STERILE IRR 1000ML POUR (IV SOLUTION) ×4 IMPLANT

## 2021-04-22 NOTE — Care Plan (Signed)
Ortho Bundle Case Management Note ? ?Patient Details  ?Name: Anna [name] ?MRN: [id number] ?Date of Birth: [birth year] ? ?[facility] RNCM call to patient prior to her Right total hip arthroplasty with Dr. Ninfa Linden on 04/22/21 to discuss that she is an Ortho bundle patient through [facility]. She is agreeable to case management. She has a daughter that will be assisting her as well as friends after surgery to help at her home. She will need a RW and 3in1/BSC. These will be ordered through [facility] to be provided at hospital prior to discharge by [facility] staff. Anticipate HHPT will be needed after a short hospital stay. Choice provided and referral sent to [facility], who have accepted patient. Reviewed all post op care instructions. Will continue to follow for needs.              ? ? ? ?DME Arranged:  3-N-1, [place] rolling ?DME Agency:  [facility] ? ?HH Arranged:  PT ?[place] [place] Agency:  [place] ? ?Additional Comments: ?Please contact me with any questions of if this plan should need to change. ? ?Jamse Arn, RN, BSN, [employer]  [phone] ?04/22/2021, 4:26 PM ?  ?

## 2021-04-22 NOTE — TOC Progression Note (Signed)
Transition of Care (TOC) - Progression Note  ? ? ?Patient Details  ?Name: Anna [name] ?MRN: [id number] ?Date of Birth: Jun 08, 1945 ? ?Transition of Care (TOC) CM/SW Contact  ?Marilu Favre, RN ?Phone Number: ?04/22/2021, 2:30 PM ? ?Clinical Narrative:    ? ? ?DR Blackman's office arranged home health services with [facility], confirmed with Marjory Lies with [facility].  ? ?3C staff will provide any needed DME.  ? ? ? ? ?Transition of Care (TOC) Screening Note ? ? ?Patient Details  ?Name: Anna [name] ?Date of Birth: [birth year] ? ? ? ? ? ?Transition of Care Department [facility]) has reviewed patient and no TOC needs have been identified at this time. We will continue to monitor patient advancement through interdisciplinary progression rounds. If new patient transition needs arise, please place a TOC consult. ?  ?  ?  ? ?Expected Discharge Plan and Services ?  ?  ?  ?  ?  ?                ?  ?  ?  ?  ?  ?  ?  ?  ?  ?  ? ? ?Social Determinants of Health (SDOH) Interventions ?  ? ?Readmission Risk Interventions ?No flowsheet data found. ? ?

## 2021-04-22 NOTE — H&P (Signed)
TOTAL HIP ADMISSION H&P ? ?Patient is admitted for right total hip arthroplasty. ? ?Subjective: ? ?Chief Complaint: right hip pain ? ?HPI: Anna [name], 76 y.o. female, has a history of pain and functional disability in the right hip(s) due to arthritis and patient has failed non-surgical conservative treatments for greater than 12 weeks to include NSAID's and/or analgesics, corticosteriod injections, flexibility and strengthening excercises, use of assistive devices, weight reduction as appropriate, and activity modification.  Onset of symptoms was gradual starting 5 years ago with gradually worsening course since that time.The patient noted no past surgery on the right hip(s).  Patient currently rates pain in the right hip at 10 out of 10 with activity. Patient has night pain, worsening of pain with activity and weight bearing, trendelenberg gait, pain that interfers with activities of daily living, and pain with passive range of motion. Patient has evidence of subchondral sclerosis, periarticular osteophytes, and joint space narrowing by imaging studies. This condition presents safety issues increasing the risk of falls.  There is no current active infection. ? ?Patient Active Problem List  ? Diagnosis Date Noted  ? Mixed hyperlipidemia 02/06/2021  ? Musculoskeletal chest pain 02/06/2021  ? Obesity (BMI 30-39.9) 02/06/2021  ? Unilateral primary osteoarthritis, right hip 11/06/2020  ? Unilateral primary osteoarthritis, right knee 11/06/2020  ? Chest pain of uncertain etiology 58/10/9831  ? Cardiac murmur 08/29/2020  ? Mixed dyslipidemia 08/29/2020  ? Arthritis 08/16/2020  ? Breast cancer ([place]) 08/16/2020  ? Headache 08/16/2020  ? Osteopenia 08/16/2020  ? Vision abnormalities 08/16/2020  ? Chronic right shoulder pain 03/07/2018  ? Chronic migraine 08/11/2017  ? ?Past Medical History:  ?Diagnosis Date  ? Arthritis   ? Breast cancer ([place])   ? age 29  ? Family history of adverse reaction to anesthesia   ? Son takes  longer to wake  ? Headache   ? Hyperlipidemia   ? Osteopenia   ? Vision abnormalities   ?  ?Past Surgical History:  ?Procedure Laterality Date  ? BACK SURGERY    ? lumbar region  ? BUNIONECTOMY Bilateral   ? MASTECTOMY Left   ? at age 81  ? TRIGGER FINGER RELEASE Left   ? left thumb  ? TUBAL LIGATION    ?  ?Current Facility-Administered Medications  ?Medication Dose Route Frequency Provider Last Rate Last Admin  ? ceFAZolin (ANCEF) IVPB 2g/100 mL premix  2 g Intravenous On Call to OR [name], PA-C      ? lactated ringers infusion   Intravenous Continuous Annye Asa, MD      ? tranexamic acid (CYKLOKAPRON) IVPB 1,000 mg  1,000 mg Intravenous To OR [name], PA-C      ? ?Allergies  ?Allergen Reactions  ? Septra [Sulfamethoxazole-Trimethoprim]   ?  Other reaction(s): itching  ? Sulfa Antibiotics Itching  ?  ?Social History  ? ?Tobacco Use  ? Smoking status: Never  ? Smokeless tobacco: Never  ?Substance Use Topics  ? Alcohol use: Never  ?  ?Family History  ?Problem Relation Age of Onset  ? Hyperlipidemia Mother   ? Hypertension Mother   ? Alzheimer's disease Father   ? Hypertension Sister   ? Hypertension Sister   ? Hypertension Sister   ? Hypertension Brother   ? Lung cancer Brother   ? Hypertension Sister   ? Hypertension Brother   ?  ? ?Review of Systems  ?Musculoskeletal:  Positive for gait problem.  ?All other systems reviewed and are negative. ? ?  Objective: ? ?Physical Exam ?Vitals reviewed.  ?Constitutional:   ?   Appearance: Normal appearance.  ?HENT:  ?   Head: Normocephalic and atraumatic.  ?Eyes:  ?   Extraocular Movements: Extraocular movements intact.  ?   Pupils: Pupils are equal, round, and reactive to light.  ?Cardiovascular:  ?   Rate and Rhythm: Normal rate and regular rhythm.  ?   Pulses: Normal pulses.  ?Pulmonary:  ?   Effort: Pulmonary effort is normal.  ?Abdominal:  ?   Palpations: Abdomen is soft.  ?Musculoskeletal:  ?   Cervical back: Normal range of motion and neck  supple.  ?   Right hip: Tenderness and bony tenderness present. Decreased range of motion. Decreased strength.  ?Neurological:  ?   Mental Status: She is alert and oriented to person, place, and time.  ?Psychiatric:     ?   Behavior: Behavior normal.  ? ? ?Vital signs in last 24 hours: ?Temp:  [98 ?F (36.7 ?C)] 98 ?F (36.7 ?C) (03/21 0547) ?Pulse Rate:  [72] 72 (03/21 0547) ?Resp:  [17] 17 (03/21 0547) ?BP: (125)/(53) 125/53 (03/21 0547) ?SpO2:  [100 %] 100 % (03/21 0547) ?Weight:  [88.9 kg] 88.9 kg (03/21 0547) ? ?Labs: ? ? ?Estimated body mass index is 31.64 kg/m? as calculated from the following: ?  Height as of this encounter: '5\' 6"'$  (1.676 m). ?  Weight as of this encounter: 88.9 kg. ? ? ?Imaging Review ?Plain radiographs demonstrate severe degenerative joint disease of the right hip(s). The bone quality appears to be good for age and reported activity level. ? ? ? ? ? ?Assessment/Plan: ? ?End stage arthritis, right hip(s) ? ?The patient history, physical examination, clinical judgement of the provider and imaging studies are consistent with end stage degenerative joint disease of the right hip(s) and total hip arthroplasty is deemed medically necessary. The treatment options including medical management, injection therapy, arthroscopy and arthroplasty were discussed at length. The risks and benefits of total hip arthroplasty were presented and reviewed. The risks due to aseptic loosening, infection, stiffness, dislocation/subluxation,  thromboembolic complications and other imponderables were discussed.  The patient acknowledged the explanation, agreed to proceed with the plan and consent was signed. Patient is being admitted for inpatient treatment for surgery, pain control, PT, OT, prophylactic antibiotics, VTE prophylaxis, progressive ambulation and ADL's and discharge planning.The patient is planning to be discharged home with home health services ? ? ? ?

## 2021-04-22 NOTE — Transfer of Care (Signed)
Immediate Anesthesia Transfer of Care Note ? ?Patient: SARANN TREGRE ? ?Procedure(s) Performed: Right TOTAL HIP ARTHROPLASTY ANTERIOR APPROACH (Right: Hip) ? ?Patient Location: PACU ? ?Anesthesia Type:MAC and Spinal ? ?Level of Consciousness: drowsy and patient cooperative ? ?Airway & Oxygen Therapy: Patient Spontanous Breathing and Patient connected to face mask oxygen ? ?Post-op Assessment: Report given to RN and Post -op Vital signs reviewed and stable ? ?Post vital signs: Reviewed and stable ? ?Last Vitals:  ?Vitals Value Taken Time  ?BP 122/57 04/22/21 0919  ?Temp    ?Pulse 85 04/22/21 0920  ?Resp 13 04/22/21 0920  ?SpO2 99 % 04/22/21 0920  ?Vitals shown include unvalidated device data. ? ?Last Pain:  ?Vitals:  ? 04/22/21 0627  ?TempSrc:   ?PainSc: 0-No pain  ?   ? ?  ? ?Complications: No notable events documented. ?

## 2021-04-22 NOTE — Anesthesia Procedure Notes (Signed)
Spinal ? ?Patient location during procedure: OR ?Start time: 04/22/2021 7:33 AM ?End time: 04/22/2021 7:36 AM ?Reason for block: surgical anesthesia ?Staffing ?Performed: anesthesiologist  ?Anesthesiologist: Santa Lighter, MD ?Preanesthetic Checklist ?Completed: patient identified, IV checked, risks and benefits discussed, surgical consent, monitors and equipment checked, pre-op evaluation and timeout performed ?Spinal Block ?Patient position: sitting ?Prep: DuraPrep and site prepped and draped ?Patient monitoring: continuous pulse ox and blood pressure ?Approach: midline ?Location: L3-4 ?Injection technique: single-shot ?Needle ?Needle type: Pencan  ?Needle gauge: 24 G ?Assessment ?Sensory level: T6 ?Events: CSF return ?Additional Notes ?Functioning IV was confirmed and monitors were applied. Sterile prep and drape, including hand hygiene, mask and sterile gloves were used. The patient was positioned and the spine was prepped. The skin was anesthetized with lidocaine.  Free flow of clear CSF was obtained prior to injecting local anesthetic into the CSF.  The spinal needle aspirated freely following injection.  The needle was carefully withdrawn.  The patient tolerated the procedure well. Consent was obtained prior to procedure with all questions answered and concerns addressed. Risks including but not limited to bleeding, infection, nerve damage, paralysis, failed block, inadequate analgesia, allergic reaction, high spinal, itching and headache were discussed and the patient wished to proceed.  ? ?Hoy Morn, MD ? ? ? ?

## 2021-04-22 NOTE — Evaluation (Signed)
Physical Therapy Evaluation ?Patient Details ?Name: Anna [name] ?MRN: [id number] ?DOB: Aug 20, 1945 ?Today's Date: 04/22/2021 ? ?History of Present Illness ? Pt is a 75 y/o female admitted secondary to R THA. PMH includes breast cancer and back surgery.  ?Clinical Impression ? Pt is s/p surgery above with deficits below. Pt requiring min A for bed mobility and transfers this session using RW. Pt reporting increased pain which limited mobility. Educated about ROM exercises for RLE. Reports daughter will be assisting her at d/c. Will continue to follow acutely.    ?   ? ?Recommendations for follow up therapy are one component of a multi-disciplinary discharge planning process, led by the attending physician.  Recommendations may be updated based on patient status, additional functional criteria and insurance authorization. ? ?Follow Up Recommendations Follow physician's recommendations for discharge plan and follow up therapies ? ?  ?Assistance Recommended at Discharge Intermittent Supervision/Assistance  ?Patient can return home with the following ? A little help with bathing/dressing/bathroom;Assistance with cooking/housework;Assist for transportation ? ?  ?Equipment Recommendations Rolling walker (2 wheels);BSC/3in1  ?Recommendations for Other Services ?    ?  ?Functional Status Assessment Patient has had a recent decline in their functional status and demonstrates the ability to make significant improvements in function in a reasonable and predictable amount of time.  ? ?  ?Precautions / Restrictions Precautions ?Precautions: Fall ?Restrictions ?Weight Bearing Restrictions: Yes ?RLE Weight Bearing: Weight bearing as tolerated  ? ?  ? ?Mobility ? Bed Mobility ?Overal bed mobility: Needs Assistance ?Bed Mobility: Supine to Sit, Sit to Supine ?  ?  ?Supine to sit: Min assist ?Sit to supine: Min assist ?  ?General bed mobility comments: Required assist with RLE management. Increased time required. ?   ? ?Transfers ?Overall transfer level: Needs assistance ?Equipment used: Rolling walker (2 wheels) ?Transfers: Sit to/from Stand ?Sit to Stand: Min assist ?  ?  ?  ?  ?  ?General transfer comment: Min A for lift assist and steadying. Cues for hand placement. ?  ? ?Ambulation/Gait ?Ambulation/Gait assistance: Min guard ?Gait Distance (Feet): 2 Feet ?Assistive device: Rolling walker (2 wheels) ?Gait Pattern/deviations: Step-to pattern, Decreased step length - right, Decreased step length - left, Antalgic ?Gait velocity: Decreased ?  ?  ?General Gait Details: Slow, antalgic steps at EOB. Pt with increased pain and unable to tolerate further. ? ?Stairs ?  ?  ?  ?  ?  ? ?Wheelchair Mobility ?  ? ?Modified Rankin (Stroke Patients Only) ?  ? ?  ? ?Balance Overall balance assessment: Needs assistance ?Sitting-balance support: No upper extremity supported, Feet supported ?Sitting balance-Leahy Scale: Good ?  ?  ?Standing balance support: Bilateral upper extremity supported ?Standing balance-Leahy Scale: Poor ?Standing balance comment: Reliant on BUE support ?  ?  ?  ?  ?  ?  ?  ?  ?  ?  ?  ?   ? ? ? ?Pertinent Vitals/Pain Pain Assessment ?Pain Assessment: Faces ?Faces Pain Scale: Hurts even more ?Pain Location: R hip ?Pain Descriptors / Indicators: Aching, Guarding, Grimacing ?Pain Intervention(s): Limited activity within patient's tolerance, Monitored during session, Repositioned  ? ? ?Home Living Family/patient expects to be discharged to:: Private residence ?Living Arrangements: Alone ?Available Help at Discharge: Family;Available 24 hours/day (daughter) ?Type of Home: House ?Home Access: Ramped entrance ?  ?  ?  ?Home Layout: One level ?Home Equipment: Kasandra Knudsen - single point ?   ?  ?Prior Function Prior Level of Function : Independent/Modified Independent ?  ?  ?  ?  ?  ?  ?  Mobility Comments: Has been using cane for ambulation ?  ?  ? ? ?Hand Dominance  ?   ? ?  ?Extremity/Trunk Assessment  ? Upper Extremity  Assessment ?Upper Extremity Assessment: Defer to OT evaluation ?  ? ?Lower Extremity Assessment ?Lower Extremity Assessment: RLE deficits/detail ?RLE Deficits / Details: Deficits consistent with post op pain and weakness. ?  ? ?Cervical / Trunk Assessment ?Cervical / Trunk Assessment: Normal  ?Communication  ? Communication: No difficulties  ?Cognition Arousal/Alertness: Suspect due to medications ?Behavior During Therapy: [facility] for tasks assessed/performed ?Overall Cognitive Status: Within Functional Limits for tasks assessed ?  ?  ?  ?  ?  ?  ?  ?  ?  ?  ?  ?  ?  ?  ?  ?  ?  ?  ?  ? ?  ?General Comments General comments (skin integrity, edema, etc.): Pt's daughter present ? ?  ?Exercises Other Exercises ?Other Exercises: Educated about performing ankle pumps 20X/hr  ? ?Assessment/Plan  ?  ?PT Assessment Patient needs continued PT services  ?PT Problem List Decreased strength;Decreased range of motion;Decreased activity tolerance;Decreased balance;Decreased mobility;Decreased knowledge of use of DME;Pain ? ?   ?  ?PT Treatment Interventions DME instruction;Gait training;Stair training;Therapeutic activities;Functional mobility training;Therapeutic exercise;Balance training;Patient/family education   ? ?PT Goals (Current goals can be found in the Care Plan section)  ?Acute Rehab PT Goals ?Patient Stated Goal: to go home ?PT Goal Formulation: With patient ?Time For Goal Achievement: 05/06/21 ?Potential to Achieve Goals: Good ? ?  ?Frequency 7X/week ?  ? ? ?Co-evaluation   ?  ?  ?  ?  ? ? ?  ?AM-PAC PT "6 Clicks" Mobility  ?Outcome Measure Help needed turning from your back to your side while in a flat bed without using bedrails?: A Little ?Help needed moving from lying on your back to sitting on the side of a flat bed without using bedrails?: A Little ?Help needed moving to and from a bed to a chair (including a wheelchair)?: A Little ?Help needed standing up from a chair using your arms (e.g., wheelchair or bedside  chair)?: A Little ?Help needed to walk in hospital room?: A Little ?Help needed climbing 3-5 steps with a railing? : A Lot ?6 Click Score: 17 ? ?  ?End of Session Equipment Utilized During Treatment: Gait belt ?Activity Tolerance: Patient limited by pain ?Patient left: in bed;with call bell/phone within reach;with family/visitor present ?Nurse Communication: Mobility status ?PT Visit Diagnosis: Unsteadiness on feet (R26.81) ?  ? ?Time: 6629-4765 ?PT Time Calculation (min) (ACUTE ONLY): 19 min ? ? ?Charges:   PT Evaluation ?$PT Eval Low Complexity: 1 Low ?  ?  ?   ? ? ?Reuel Derby, PT, DPT  ?Acute Rehabilitation Services  ?Pager: [phone] ?Office: [phone] ? ? ?[facility] ?04/22/2021, 3:19 PM ? ?

## 2021-04-22 NOTE — Anesthesia Postprocedure Evaluation (Signed)
Anesthesia Post Note ? ?Patient: Anna [name] ? ?Procedure(s) Performed: Right TOTAL HIP ARTHROPLASTY ANTERIOR APPROACH (Right: Hip) ? ?  ? ?Patient location during evaluation: PACU ?Anesthesia Type: Spinal ?Level of consciousness: awake, awake and alert and oriented ?Pain management: pain level controlled ?Vital Signs Assessment: post-procedure vital signs reviewed and stable ?Respiratory status: spontaneous breathing, nonlabored ventilation and respiratory function stable ?Cardiovascular status: blood pressure returned to baseline and stable ?Postop Assessment: no headache, no backache, spinal receding and no apparent nausea or vomiting ?Anesthetic complications: no ? ? ?No notable events documented. ? ?Last Vitals:  ?Vitals:  ? 04/22/21 1031 04/22/21 1631  ?BP: (!) 102/54 110/61  ?Pulse: 67 88  ?Resp: 18 18  ?Temp:  36.4 ?C  ?SpO2: 98% 98%  ?  ?Last Pain:  ?Vitals:  ? 04/22/21 1925  ?TempSrc:   ?PainSc: 3   ? ? ?  ?  ?  ?  ?  ?  ? ?Santa Lighter ? ? ? ? ?

## 2021-04-22 NOTE — Op Note (Signed)
NAME: Anna [name], Anna E. ?MEDICAL RECORD NO: [id number] ?ACCOUNT NO: [id number] ?DATE OF BIRTH: 1945/05/26 ?FACILITY: [facility] ?LOCATION: [facility] ?PHYSICIAN: Lind Guest. Ninfa Linden, MD ? ?Operative Report  ? ?DATE OF PROCEDURE: 04/22/2021 ? ?PREOPERATIVE DIAGNOSIS:  Primary osteoarthritis and degenerative joint disease, right hip. ? ?POSTOPERATIVE DIAGNOSIS:  Primary osteoarthritis and degenerative joint disease, right hip. ? ?PROCEDURE:  Right total hip arthroplasty through direct anterior approach. ? ?IMPLANTS:  DePuy Sector Gription acetabular component size 50, size 32+4 neutral polyethylene liner, size 3 ACTIS femoral component with standard offset, size 32+5 metal hip ball. ? ?SURGEON:  Lind Guest. Ninfa Linden, MD ? ?ASSISTANT:  Benita Stabile, PA-C ? ?ANESTHESIA:  Spinal. ? ?ANTIBIOTICS:  2 g IV Ancef. ? ?ESTIMATED BLOOD LOSS:  300 mL. ? ?COMPLICATIONS:  None. ? ?INDICATIONS:  The patient is a 76 year old female that I have known for many years now. She has debilitating well documented arthritis of her right hip.  At this point, it is detrimentally affecting her mobility, her quality of life and activities of  ?daily living to the point she does wish to proceed with a total right hip arthroplasty and we agree with this as well.  We talked in length and detail about the risks and benefits of the surgery.  We talked about the risk of acute blood loss anemia,  ?nerve and vessel injury, fracture, infection, implant failure, dislocation, DVT, leg length differences and skin and soft tissue issues.  We talked about our goals being decreased pain, improve mobility and overall improve quality of life. ? ?DESCRIPTION OF PROCEDURE:  After informed consent was obtained, appropriate right hip was marked.  She was brought to the operating room and sat up on the stretcher where spinal anesthesia was obtained.  She was laid in a supine position on the  ?stretcher.  A Foley catheter was placed and traction boots were placed on both her  feet.  Next, she was placed supine on the Hana fracture table.  The perineal post in place and both legs in line skeletal traction devices, but no traction applied.  Her  ?right operative hip was assessed radiographically and then, we prepped the right hip with DuraPrep and sterile drapes.  A timeout was called.  She was identified as correct patient, correct right hip.  We then made an incision just inferior and posterior ? to the anterior superior iliac spine and carried this slightly obliquely down the leg.  We dissected down to tensor fascia lata muscle.  Tensor fascia was then divided longitudinally to proceed with direct anterior approach to the hip.  We identified  ?and cauterized circumflex vessels and then identified the hip capsule, opened the hip capsule in an L-type format, finding moderate joint effusion.  We placed curved retractors around the medial and lateral femoral neck and made our femoral neck cut with ? an oscillating saw, proximal to the lesser trochanter and completed this with an osteotome.  I placed a corkscrew guide in the femoral head and removed the femoral heads in entirety and found a wide area devoid of cartilage.  I then placed a bent  ?Hohmann over the medial acetabular rim and removed remnants of acetabular labrum and other debris.  We then began reaming under direct visualization from a size 43 reamer in a stepwise increments going up to a size 49 reamer with all reamers placed under ? direct visualization and the last reamer was placed under direct fluoroscopy, so we could obtain our depth of reaming, our inclination and anteversion.  I then placed real DePuy Sector Gription acetabular component size 50 and a 32+4 neutral  ?polyethylene liner for that size acetabular component.  Attention was then turned to the femur.  With the leg externally rotated to 120 degrees extended, and adducted, we were able to place a Mueller retractor medially and Hohmann retractor above the  ?greater  trochanter. We released lateral joint capsule and used a box cutting osteotome to enter the femoral canal and a rongeur to lateralize.  We then began broaching using the ACTIS broaching system from a size 0 going to a size 3.  With a size 3 in  ?place, we trialed a standard offset femoral neck and a 32+1 trial hip ball, reduced this in the acetabulum.  We were pleased with leg length, offset, range of motion and stability assessed radiographically and clinically.  We then dislocated the hip and  ?removed the trial components.  I placed the real ACTIS femoral component with high offset size 3 and we went with the real 32+1 hip ball, but this time on reducing the pelvis, I just did not like how it looked radiographically and something seemed off  ?about the positioning of that.  We dislocated the hip and removed that ball and decided to go with 32+5 metal hip ball for the real final hip ball.  We placed a 32+5 metal hip ball and reduced this in the acetabulum and this one I was pleased with  ?definitely stability, leg length, range of motion and offset assessed both mechanically and radiographically.  We then irrigated the soft tissue with normal saline solution using pulsatile lavage.  The joint capsule was closed with interrupted #1  ?Ethibond suture followed by #1 Vicryl to close the tensor fascia.  0 Vicryl was used to close deep tissue and 2-0 Vicryl was used to close the subcutaneous tissue.  The skin was closed with staples.  An Aquacel dressing was applied.  She was taken off  ?the Hana table and taken to recovery room in stable condition with all final counts being correct.  No complications noted.  Of note, Benita Stabile, PA-C, assisted during the entire case and his assistance was crucial for every aspect of this case including  ?assistance with soft tissue management and retracting.  He was essential for helping with guiding implant placement and layered closure of the wound.  His assistance was medically  necessary. ? ? ?NIK ?D: 04/22/2021 9:01:11 am T: 04/22/2021 9:26:00 am  ?JOB: [id number]/ [id number]  ?

## 2021-04-22 NOTE — Brief Op Note (Signed)
04/22/2021 ? ?9:02 AM ? ?PATIENT:  Anna [name]  76 y.o. female ? ?PRE-OPERATIVE DIAGNOSIS:  Osteoarthritis / Degenerative joint Diease Right hip ? ?POST-OPERATIVE DIAGNOSIS:  Osteoarthritis / Degenerative joint Diease Right hip ? ?PROCEDURE:  Procedure(s): ?Right TOTAL HIP ARTHROPLASTY ANTERIOR APPROACH (Right) ? ?SURGEON:  Surgeon(s) and Role: ?   [name], MD - Primary ? ?PHYSICIAN ASSISTANT:  Benita Stabile, PA-C ? ?ANESTHESIA:   spinal ? ?EBL:  300 mL  ? ?COUNTS:  YES ? ?DICTATION: .Other Dictation: Dictation Number [id number] ? ?PLAN OF CARE: Admit for overnight observation ? ?PATIENT DISPOSITION:  PACU - hemodynamically stable. ?  ?Delay start of Pharmacological VTE agent (>24hrs) due to surgical blood loss or risk of bleeding: no ? ?

## 2021-04-23 ENCOUNTER — Encounter (HOSPITAL_COMMUNITY): Payer: Self-pay | Admitting: Orthopaedic Surgery

## 2021-04-23 DIAGNOSIS — Z79899 Other long term (current) drug therapy: Secondary | ICD-10-CM | POA: Diagnosis not present

## 2021-04-23 DIAGNOSIS — M1611 Unilateral primary osteoarthritis, right hip: Secondary | ICD-10-CM | POA: Diagnosis not present

## 2021-04-23 DIAGNOSIS — Z853 Personal history of malignant neoplasm of breast: Secondary | ICD-10-CM | POA: Diagnosis not present

## 2021-04-23 LAB — BASIC METABOLIC PANEL
Anion gap: 7 (ref 5–15)
BUN: 16 mg/dL (ref 8–23)
CO2: 27 mmol/L (ref 22–32)
Calcium: 8.9 mg/dL (ref 8.9–10.3)
Chloride: 102 mmol/L (ref 98–111)
Creatinine, Ser: 0.87 mg/dL (ref 0.44–1.00)
GFR, Estimated: >60 mL/min (ref >60)
Glucose, Bld: 147 mg/dL — ABNORMAL HIGH (ref 70–99)
Potassium: 3.7 mmol/L (ref 3.5–5.1)
Sodium: 136 mmol/L (ref 135–145)

## 2021-04-23 LAB — CBC
HCT: 29.4 % — ABNORMAL LOW (ref 36.0–46.0)
Hemoglobin: 9.8 g/dL — ABNORMAL LOW (ref 12.0–15.0)
MCH: 31 pg (ref 26.0–34.0)
MCHC: 33.3 g/dL (ref 30.0–36.0)
MCV: 93 fL (ref 80.0–100.0)
Platelets: 197 10*3/uL (ref 150–400)
RBC: 3.16 MIL/uL — ABNORMAL LOW (ref 3.87–5.11)
RDW: 12.8 % (ref 11.5–15.5)
WBC: 11.5 10*3/uL — ABNORMAL HIGH (ref 4.0–10.5)
nRBC: 0 % (ref 0.0–0.2)

## 2021-04-23 MED ORDER — METHOCARBAMOL 500 MG PO TABS: 500.0000 mg | ORAL_TABLET | Freq: Four times a day (QID) | ORAL | 1 refills | Status: DC | PRN

## 2021-04-23 MED ORDER — ASPIRIN 81 MG PO CHEW: 81.0000 mg | CHEWABLE_TABLET | Freq: Two times a day (BID) | ORAL | 0 refills | Status: DC

## 2021-04-23 MED ORDER — OXYCODONE HCL 5 MG PO TABS: 5.0000 mg | ORAL_TABLET | ORAL | 0 refills | Status: DC | PRN

## 2021-04-23 NOTE — Progress Notes (Signed)
Physical Therapy Treatment ?Patient Details ?Name: Anna [name] ?MRN: [id number] ?DOB: Jul 23, 1945 ?Today's Date: 04/23/2021 ? ? ?History of Present Illness Pt is a 76 y/o female admitted secondary to R THA. PMH includes breast cancer and back surgery. ? ?  ?PT Comments  ? ? Patient progressing towards physical therapy goals since last session, however limited by pain in R hip. Patient ambulatory in hallway with very slow, antalgic gait pattern and heavy use of RW to offweight R LE. Provided surgical hip HEP to patient with instruction on frequency and technique. Patient demonstrated understanding and able to perform most of exercises actively and some with minimal assistance. D/c plan remains appropriate. ?  ?Recommendations for follow up therapy are one component of a multi-disciplinary discharge planning process, led by the attending physician.  Recommendations may be updated based on patient status, additional functional criteria and insurance authorization. ? ?Follow Up Recommendations ? Follow physician's recommendations for discharge plan and follow up therapies ?  ?  ?Assistance Recommended at Discharge Intermittent Supervision/Assistance  ?Patient can return home with the following A little help with bathing/dressing/bathroom;Assistance with cooking/housework;Assist for transportation ?  ?Equipment Recommendations ? Rolling Raymon Schlarb (2 wheels);BSC/3in1  ?  ?Recommendations for Other Services   ? ? ?  ?Precautions / Restrictions Precautions ?Precautions: Fall ?Restrictions ?Weight Bearing Restrictions: Yes ?RLE Weight Bearing: Weight bearing as tolerated  ?  ? ?Mobility ? Bed Mobility ?  ?  ?  ?  ?  ?  ?  ?General bed mobility comments: handoff from OT standing in bathroom ?  ? ?Transfers ?Overall transfer level: Needs assistance ?Equipment used: Rolling Valda Christenson (2 wheels) ?Transfers: Sit to/from Stand ?Sit to Stand: Min assist ?  ?  ?  ?  ?  ?General transfer comment: minA for slow controlled descent into chair.  Patient kicking R LE out in front to avoid bending at the hip ?  ? ?Ambulation/Gait ?Ambulation/Gait assistance: Supervision ?Gait Distance (Feet): 70 Feet ?Assistive device: Rolling Naira Standiford (2 wheels) ?Gait Pattern/deviations: Step-to pattern, Decreased step length - right, Decreased step length - left, Antalgic, Decreased stance time - right, Decreased stride length, Decreased weight shift to right ?Gait velocity: Decreased ?  ?  ?General Gait Details: heavy use of UE support to offweight R LE due to pain. Very slow antalgic gait pattern. Cues for heel strike and R foot pointing forward as patient tends to keep R LE externally rotated ? ? ?Stairs ?Stairs:  (patient states she can use ramp to access home) ?  ?  ?  ?  ? ? ?Wheelchair Mobility ?  ? ?Modified Rankin (Stroke Patients Only) ?  ? ? ?  ?Balance Overall balance assessment: Needs assistance ?Sitting-balance support: No upper extremity supported, Feet supported ?Sitting balance-Leahy Scale: Good ?  ?  ?Standing balance support: Bilateral upper extremity supported ?Standing balance-Leahy Scale: Poor ?Standing balance comment: Reliant on BUE support ?  ?  ?  ?  ?  ?  ?  ?  ?  ?  ?  ?  ? ?  ?Cognition Arousal/Alertness: Awake/alert ?Behavior During Therapy: [facility] for tasks assessed/performed ?Overall Cognitive Status: Within Functional Limits for tasks assessed ?  ?  ?  ?  ?  ?  ?  ?  ?  ?  ?  ?  ?  ?  ?  ?  ?  ?  ?  ? ?  ?Exercises Total Joint Exercises ?Ankle Circles/Pumps: AROM, Both, 10 reps, Other (comment) (long sitting) ?Quad Sets: AROM, Both, 5  reps, Other (comment) (long sitting) ?Heel Slides: AROM, Left, AAROM, Right, 5 reps, Other (comment) (long sitting) ?Hip ABduction/ADduction: AROM, Left, AAROM, Right, 5 reps, Other (comment) (long sitting) ?Other Exercises ?Other Exercises: Provided patient with surgical hip HEP with instruction on frequency and technique. ? ?  ?General Comments   ?  ?  ? ?Pertinent Vitals/Pain Pain Assessment ?Pain Assessment:  Faces ?Faces Pain Scale: Hurts whole lot ?Pain Location: R hip ?Pain Descriptors / Indicators: Aching, Guarding, Grimacing ?Pain Intervention(s): Monitored during session  ? ? ?Home Living Family/patient expects to be discharged to:: Private residence ?Living Arrangements: Alone ?Available Help at Discharge: Family;Available 24 hours/day (daughter and sisters coming for the first 3 weeks) ?Type of Home: House ?Home Access: Ramped entrance ?  ?  ?  ?Home Layout: One level ?Home Equipment: Kasandra Knudsen - single point ?   ?  ?Prior Function    ?  ?  ?   ? ?PT Goals (current goals can now be found in the care plan section) Acute Rehab PT Goals ?Patient Stated Goal: to go home ?PT Goal Formulation: With patient ?Time For Goal Achievement: 05/06/21 ?Potential to Achieve Goals: Good ?Progress towards PT goals: Progressing toward goals ? ?  ?Frequency ? ? ? 7X/week ? ? ? ?  ?PT Plan Current plan remains appropriate  ? ? ?Co-evaluation   ?  ?  ?  ?  ? ?  ?AM-PAC PT "6 Clicks" Mobility   ?Outcome Measure ? Help needed turning from your back to your side while in a flat bed without using bedrails?: A Little ?Help needed moving from lying on your back to sitting on the side of a flat bed without using bedrails?: A Little ?Help needed moving to and from a bed to a chair (including a wheelchair)?: A Little ?Help needed standing up from a chair using your arms (e.g., wheelchair or bedside chair)?: A Little ?Help needed to walk in hospital room?: A Little ?Help needed climbing 3-5 steps with a railing? : A Lot ?6 Click Score: 17 ? ?  ?End of Session Equipment Utilized During Treatment: Gait belt ?Activity Tolerance: Patient limited by pain ?Patient left: in chair;with call bell/phone within reach ?Nurse Communication: Mobility status ?PT Visit Diagnosis: Unsteadiness on feet (R26.81) ?  ? ? ?Time: [id number] ?PT Time Calculation (min) (ACUTE ONLY): 24 min ? ?Charges:  $Gait Training: 8-22 mins ?$Therapeutic Exercise: 8-22 mins           ?          ? ?Jayvier Burgher A. Gilford Rile, PT, DPT ?Acute Rehabilitation Services ?Pager [phone] ?Office [phone] ? ? ? ?Junah Yam A Arnel Wymer ?04/23/2021, 9:39 AM ? ?

## 2021-04-23 NOTE — Evaluation (Signed)
Occupational Therapy Evaluation Patient Details Name: Anna [name] MRN: [id number] DOB: [birth year] Today's Date: 04/23/2021   History of Present Illness Pt is a 76 y/o female admitted secondary to R THA. PMH includes breast cancer and back surgery.   Clinical Impression   Prior to this admission, patient was living independently and completing all ADLs independently. Currently, patient is requiring min A for bed mobility and extra time due to pain. Patient is min-max A with ADLs (max A for lower body dressing and bathing however has sock aide at home and will have assist from daughter and sisters). Patient with slightly improved activity tolerance in comparison to PT eval, however requires increased time with RW with all ambulation. BSC ordered for patient due to patient's difficulty getting to the bathroom independently.   OT recommending HHOT at discharge, and OT will continue to follow acutely to address problem list outlined below.    Recommendations for follow up therapy are one component of a multi-disciplinary discharge planning process, led by the attending physician.  Recommendations may be updated based on patient status, additional functional criteria and insurance authorization.   Follow Up Recommendations  Home health OT    Assistance Recommended at Discharge Frequent or constant Supervision/Assistance  Patient can return home with the following A little help with walking and/or transfers;A lot of help with bathing/dressing/bathroom;Assistance with cooking/housework;Assist for transportation;Help with stairs or ramp for entrance    Functional Status Assessment  Patient has had a recent decline in their functional status and demonstrates the ability to make significant improvements in function in a reasonable and predictable amount of time.  Equipment Recommendations  BSC/3in1;Other (comment) (RW)    Recommendations for Other Services       Precautions / Restrictions  Precautions Precautions: Fall Restrictions Weight Bearing Restrictions: Yes RLE Weight Bearing: Weight bearing as tolerated      Mobility Bed Mobility Overal bed mobility: Needs Assistance Bed Mobility: Supine to Sit     Supine to sit: Min assist     General bed mobility comments: Required assist with RLE management. Increased time required. Increased assist with bed pad to fully come into sitting provided at RLE    Transfers Overall transfer level: Needs assistance Equipment used: Rolling walker (2 wheels) Transfers: Sit to/from Stand Sit to Stand: Min assist           General transfer comment: Min A for lift assist and steadying. Cues for hand placement.      Balance Overall balance assessment: Needs assistance Sitting-balance support: No upper extremity supported, Feet supported Sitting balance-Leahy Scale: Good     Standing balance support: Bilateral upper extremity supported Standing balance-Leahy Scale: Poor Standing balance comment: Reliant on BUE support                           ADL either performed or assessed with clinical judgement   ADL Overall ADL's : Needs assistance/impaired Eating/Feeding: Set up;Sitting   Grooming: Set up;Sitting;Standing   Upper Body Bathing: Minimal assistance;Sitting   Lower Body Bathing: Sitting/lateral leans;Sit to/from stand;Maximal assistance   Upper Body Dressing : Minimal assistance;Sitting;Standing   Lower Body Dressing: Maximal assistance;Sitting/lateral leans;Sit to/from stand Lower Body Dressing Details (indicate cue type and reason): has sock aide at home to assist Toilet Transfer: Moderate assistance;Cueing for sequencing;Cueing for safety;Regular Toilet;Rolling walker (2 wheels);Grab bars Toilet Transfer Details (indicate cue type and reason): heavy use of brag bar to descend Toileting- Clothing Manipulation and Hygiene: Moderate  assistance;Cueing for safety;Cueing for sequencing Toileting -  Clothing Manipulation Details (indicate cue type and reason): mod A to come into standing to complete, unable to shift weight in sitting to complete     Functional mobility during ADLs: Moderate assistance;Rolling walker (2 wheels) General ADL Comments: Patient presenting with decreased activity tolerance and post op pain     Vision Baseline Vision/History: 1 Wears glasses Ability to See in Adequate Light: 0 Adequate Patient Visual Report: No change from baseline       Perception     Praxis      Pertinent Vitals/Pain Pain Assessment Pain Assessment: Faces Faces Pain Scale: Hurts even more Pain Location: R hip Pain Descriptors / Indicators: Aching, Guarding, Grimacing Pain Intervention(s): Limited activity within patient's tolerance, Monitored during session     Hand Dominance     Extremity/Trunk Assessment Upper Extremity Assessment Upper Extremity Assessment: Overall WFL for tasks assessed   Lower Extremity Assessment Lower Extremity Assessment: Defer to PT evaluation;Overall [facility] for tasks assessed RLE Deficits / Details: Deficits consistent with post op pain and weakness.   Cervical / Trunk Assessment Cervical / Trunk Assessment: Normal   Communication Communication Communication: No difficulties   Cognition Arousal/Alertness: Awake/alert Behavior During Therapy: WFL for tasks assessed/performed Overall Cognitive Status: Within Functional Limits for tasks assessed                                       General Comments       Exercises     Shoulder Instructions      Home Living Family/patient expects to be discharged to:: Private residence Living Arrangements: Alone Available Help at Discharge: Family;Available 24 hours/day (daughter and sisters coming for the first 3 weeks) Type of Home: House Home Access: Ramped entrance     Home Layout: One level     Bathroom Shower/Tub: Walk-in shower (minimal threshold)   Bathroom Toilet:  Standard     Home Equipment: Cane - single point          Prior Functioning/Environment Prior Level of Function : Independent/Modified Independent             Mobility Comments: Has been using cane for ambulation ADLs Comments: Independent        OT Problem List: Decreased strength;Decreased activity tolerance;Impaired balance (sitting and/or standing);Decreased knowledge of use of DME or AE;Pain;Decreased coordination      OT Treatment/Interventions: Self-care/ADL training;Therapeutic exercise;Energy conservation;DME and/or AE instruction;Manual therapy;Therapeutic activities;Patient/family education;Balance training    OT Goals(Current goals can be found in the care plan section) Acute Rehab OT Goals Patient Stated Goal: to have less pain OT Goal Formulation: With patient Time For Goal Achievement: 05/07/21 Potential to Achieve Goals: Good ADL Goals Pt Will Perform Lower Body Bathing: Independently;sitting/lateral leans;sit to/from stand;with adaptive equipment Pt Will Perform Lower Body Dressing: Independently;with adaptive equipment;sitting/lateral leans;sit to/from stand Pt Will Transfer to Toilet: Independently;regular height toilet;ambulating Pt Will Perform Toileting - Clothing Manipulation and hygiene: Independently;sitting/lateral leans;sit to/from stand  OT Frequency: Min 5X/week    Co-evaluation              AM-PAC OT "6 Clicks" Daily Activity     Outcome Measure Help from another person eating meals?: A Little Help from another person taking care of personal grooming?: A Little Help from another person toileting, which includes using toliet, bedpan, or urinal?: A Lot Help from another person bathing (including washing, rinsing,  drying)?: A Lot Help from another person to put on and taking off regular upper body clothing?: A Little Help from another person to put on and taking off regular lower body clothing?: A Lot 6 Click Score: 15   End of  Session Equipment Utilized During Treatment: Gait belt;Rolling walker (2 wheels) Nurse Communication: Mobility status  Activity Tolerance: Patient limited by pain;Patient tolerated treatment well Patient left: Other (comment) (Handed off to PT at end of session)  OT Visit Diagnosis: Other abnormalities of gait and mobility (R26.89);Unsteadiness on feet (R26.81);Muscle weakness (generalized) (M62.81);Pain Pain - Right/Left: Right Pain - part of body: Hip                Time: 1610-9604 OT Time Calculation (min): 32 min Charges:  OT General Charges $OT Visit: 1 Visit OT Evaluation $OT Eval Moderate Complexity: 1 Mod OT Treatments $Self Care/Home Management : 8-22 mins  Pollyann Glen E. Dominga Mcduffie, OTR/L Acute Rehabilitation Services [phone]   Cherlyn Cushing 04/23/2021, 9:16 AM

## 2021-04-23 NOTE — Discharge Instructions (Signed)

## 2021-04-23 NOTE — Discharge Summary (Signed)
?Patient ID: ?Anna [name] ?MRN: [id number] ?DOB/AGE: February 10, 1945 76 y.o. ? ?Admit date: 04/22/2021 ?Discharge date: 04/23/2021 ? ?Admission Diagnoses:  ?Principal Problem: ?  Unilateral primary osteoarthritis, right hip ?Active Problems: ?  Status post total replacement of right hip ? ? ?Discharge Diagnoses:  ?Status post right total hip arthroplasty ? ?Post op anemia asymptomatic  ? ?Past Medical History:  ?Diagnosis Date  ? Arthritis   ? Breast cancer ([place])   ? age 79  ? Family history of adverse reaction to anesthesia   ? Son takes longer to wake  ? Headache   ? Hyperlipidemia   ? Osteopenia   ? Vision abnormalities   ? ? ?Surgeries: Procedure(s): ?Right TOTAL HIP ARTHROPLASTY ANTERIOR APPROACH on 04/22/2021 ?  ?Consultants:  ? ?Discharged Condition: Improved ? ?Hospital Course: Anna [name] is an 76 y.o. female who was admitted 04/22/2021 for operative treatment ofUnilateral primary osteoarthritis, right hip. Patient has severe unremitting pain that affects sleep, daily activities, and work/hobbies. After pre-op clearance the patient was taken to the operating room on 04/22/2021 and underwent  Procedure(s): ?Right TOTAL HIP ARTHROPLASTY ANTERIOR APPROACH.   ? ?Patient was given perioperative antibiotics:  ?Anti-infectives (From admission, onward)  ? ? Start     Dose/Rate Route Frequency Ordered Stop  ? 04/22/21 1130  ceFAZolin (ANCEF) IVPB 1 g/50 mL premix       ? 1 g ?100 mL/hr over 30 Minutes Intravenous Every 6 hours 04/22/21 1034 04/22/21 1814  ? 04/22/21 0600  ceFAZolin (ANCEF) IVPB 2g/100 mL premix       ? 2 g ?200 mL/hr over 30 Minutes Intravenous On call to O.R. 04/22/21 0867 04/22/21 0737  ? ?  ?  ? ?Patient was given sequential compression devices, early ambulation, and chemoprophylaxis to prevent DVT. ? ?Patient benefited maximally from hospital stay and there were no complications.   ? ?Recent vital signs: Patient Vitals for the past 24 hrs: ? BP Temp Temp src Pulse Resp SpO2  ?04/23/21 0730 (!) 91/57  98.3 ?F (36.8 ?C) Oral 74 18 96 %  ?04/23/21 0418 (!) 120/58 98.7 ?F (37.1 ?C) Oral 84 20 100 %  ?04/22/21 2321 103/64 98.3 ?F (36.8 ?C) Oral 89 20 99 %  ?04/22/21 2010 116/68 98.3 ?F (36.8 ?C) Oral 91 18 100 %  ?04/22/21 1631 110/61 97.6 ?F (36.4 ?C) Oral 88 18 98 %  ?04/22/21 1031 (!) 102/54 -- -- 67 18 98 %  ?04/22/21 1020 126/63 (!) 97.1 ?F (36.2 ?C) -- 72 14 97 %  ?04/22/21 1005 133/61 -- -- 75 12 96 %  ?04/22/21 0950 (!) 129/58 -- -- 68 12 99 %  ?04/22/21 0947 -- -- -- 67 15 98 %  ?04/22/21 0935 129/64 -- -- 79 17 98 %  ?04/22/21 0920 (!) 122/57 (!) 97.1 ?F (36.2 ?C) -- 85 13 99 %  ?  ? ?Recent laboratory studies:  ?Recent Labs  ?  04/23/21 ?0705  ?WBC 11.5*  ?HGB 9.8*  ?HCT 29.4*  ?PLT 197  ?NA 136  ?K 3.7  ?CL 102  ?CO2 27  ?BUN 16  ?CREATININE 0.87  ?GLUCOSE 147*  ?CALCIUM 8.9  ? ? ? ?Discharge Medications:   ?Allergies as of 04/23/2021   ? ?   Reactions  ? Septra [sulfamethoxazole-trimethoprim]   ? Other reaction(s): itching  ? Sulfa Antibiotics Itching  ? ?  ? ?  ?Medication List  ?  ? ?TAKE these medications   ? ?Acetaminophen 500 MG capsule ?Take 500-1,000 mg  by mouth daily. Take 1000 mg in the morning and 500 mg at bedtime ?  ?ASPERCREME LIDOCAINE EX ?Apply 1 application. topically daily as needed (Pain). ?  ?aspirin 81 MG chewable tablet ?Chew 1 tablet (81 mg total) by mouth 2 (two) times daily. ?  ?Calcium 600-200 MG-UNIT tablet ?Take 2 tablets by mouth daily. ?  ?famotidine 40 MG tablet ?Commonly known as: PEPCID ?Take 40 mg by mouth at bedtime. ?  ?ferrous sulfate 324 MG Tbec ?Take 324 mg by mouth daily with breakfast. ?  ?levothyroxine 100 MCG tablet ?Commonly known as: SYNTHROID ?Take 100 mcg by mouth daily. ?  ?lidocaine 5 % ?Commonly known as: Lidoderm ?Place 1 patch onto the skin daily. Remove & Discard patch within 12 hours or as directed by MD ?What changed:  ?when to take this ?reasons to take this ?  ?methocarbamol 500 MG tablet ?Commonly known as: ROBAXIN ?Take 1 tablet (500 mg total) by  mouth every 6 (six) hours as needed for muscle spasms. ?  ?omeprazole 20 MG capsule ?Commonly known as: PRILOSEC ?Take 1 capsule (20 mg total) by mouth daily. ?  ?OVER THE COUNTER MEDICATION ?Place 1 drop into both eyes daily as needed (Dry eye). Dry eye formula ?  ?oxyCODONE 5 MG immediate release tablet ?Commonly known as: Oxy IR/ROXICODONE ?Take 1-2 tablets (5-10 mg total) by mouth every 4 (four) hours as needed for moderate pain (pain score 4-6). ?  ?pravastatin 40 MG tablet ?Commonly known as: PRAVACHOL ?Take 40 mg by mouth daily. ?  ?SUMAtriptan 25 MG tablet ?Commonly known as: IMITREX ?Take 1 tab at onset of migraine.  May repeat in 2 hrs, if needed.  Max dose: 2 tabs/day. This is a 30 day prescription. ?  ?Vitamin D-3 125 MCG (5000 UT) Tabs ?Take 5,000 Units by mouth daily. ?  ? ?  ? ?  ?  ? ? ?  ?Durable Medical Equipment  ?(From admission, onward)  ?  ? ? ?  ? ?  Start     Ordered  ? 04/22/21 1035  DME 3 n 1  Once       ? 04/22/21 1034  ? 04/22/21 1035  DME Walker rolling  Once       ?Question Answer Comment  ?Walker: With 5 Inch Wheels   ?Patient needs a walker to treat with the following condition Status post total replacement of right hip   ?  ? 04/22/21 1034  ? ?  ?  ? ?  ? ? ?Diagnostic Studies: DG Pelvis Portable ? ?Result Date: 04/22/2021 ?CLINICAL DATA:  Status post total knee replacement of right hip. EXAM: PORTABLE PELVIS 1-2 VIEWS COMPARISON:  Intraoperative fluoroscopic images of the right hip 04/22/2021. Radiographs of the right hip 02/10/2021. FINDINGS: Postoperative changes from recent right total hip arthroplasty. The femoral and acetabular components appear well seated. No unexpected finding on this single AP view radiograph. Overlying skin staples. Incidentally noted calcified uterine fibroids. IMPRESSION: Postoperative changes from recent right total hip arthroplasty. No unexpected finding on this single AP view radiograph. Electronically Signed   By: Kellie Simmering D.O.   On: 04/22/2021  09:46  ? ?DG C-Arm 1-60 Min-No Report ? ?Result Date: 04/22/2021 ?Fluoroscopy was utilized by the requesting physician.  No radiographic interpretation.  ? ?DG C-Arm 1-60 Min-No Report ? ?Result Date: 04/22/2021 ?Fluoroscopy was utilized by the requesting physician.  No radiographic interpretation.  ? ?DG HIP UNILAT WITH PELVIS 1V RIGHT ? ?Result Date: 04/22/2021 ?CLINICAL DATA:  Right  hip arthroplasty EXAM: DG HIP (WITH OR WITHOUT PELVIS) 1V RIGHT COMPARISON:  Hip radiograph 02/10/2021 FINDINGS: Eight C-arm fluoroscopic images were obtained intraoperatively and submitted for post operative interpretation. Initial images demonstrate marked degenerative change about the right hip. Subsequent images demonstrate postsurgical changes reflecting right hip arthroplasty. Hardware alignment is within expected limits, without evidence of complication. Degenerative changes are also noted about the left hip. The symphysis pubis is intact. A calcified uterine fibroid is noted. Fluoro time 20 seconds, dose 2.44 mGy. Please see the performing provider's procedural report for further detail. IMPRESSION: Status post right hip arthroplasty without evidence of immediate complication. Electronically Signed   By: Valetta Mole M.D.   On: 04/22/2021 09:17   ? ?Disposition: Discharge disposition: 06-Home-Health Care Svc ? ? ? ? ? ? ? ? ? Follow-up Information   ? ? [facility] [place] Follow up.   ?Specialty: Home Health Services ?Contact information: ?[address] ?[address] ?[place] [address] ?[phone] ? ? ?  ?  ? ? [name], MD. Go on 05/07/2021.   ?Specialty: Orthopedic Surgery ?Why: at 10:30 am for your first in office visit with Dr. Ninfa Linden ?Contact information: ?[address] ?[place] [address] ?[phone] ? ? ?  ?  ? ?  ?  ? ?  ? ? ? ?Signed: ?Sahej Schrieber ?04/23/2021, 8:47 AM ? ? ? ?

## 2021-04-23 NOTE — Progress Notes (Signed)
Subjective: ?1 Day Post-Op Procedure(s) (LRB): ?Right TOTAL HIP ARTHROPLASTY ANTERIOR APPROACH (Right) ?Patient reports pain as 8 on 0-10 scale.  Denies any dizziness, chest pain or SOB. Walked to door yesterday.  ? ?Objective: ?Vital signs in last 24 hours: ?Temp:  [97.1 ?F (36.2 ?C)-98.7 ?F (37.1 ?C)] 98.3 ?F (36.8 ?C) (03/22 0730) ?Pulse Rate:  [67-91] 74 (03/22 0730) ?Resp:  [12-20] 18 (03/22 0730) ?BP: (91-133)/(54-68) 91/57 (03/22 0730) ?SpO2:  [96 %-100 %] 96 % (03/22 0730) ? ?Intake/Output from previous day: ?03/21 0701 - 03/22 0700 ?In: 1811.8 [P.O.:270; I.V.:1457.5; IV Piggyback:84.3] ?Out: 2025 [Urine:1725; Blood:300] ?Intake/Output this shift: ?No intake/output data recorded. ? ?Recent Labs  ?  04/23/21 ?0705  ?HGB 9.8*  ? ?Recent Labs  ?  04/23/21 ?0705  ?WBC 11.5*  ?RBC 3.16*  ?HCT 29.4*  ?PLT 197  ? ?Recent Labs  ?  04/23/21 ?0705  ?NA 136  ?K 3.7  ?CL 102  ?CO2 27  ?BUN 16  ?CREATININE 0.87  ?GLUCOSE 147*  ?CALCIUM 8.9  ? ?No results for input(s): LABPT, INR in the last 72 hours. ? ?Sensation intact distally ?Dorsiflexion/Plantar flexion intact ?Incision: moderate drainage ?Compartment soft ? ? ?Assessment/Plan: ?1 Day Post-Op Procedure(s) (LRB): ?Right TOTAL HIP ARTHROPLASTY ANTERIOR APPROACH (Right) ?Up with therapy ?Acute blood loss anemia without symptoms . Will monitor for symptoms of anemia . ?Possible discharge home later today if stable and does well with PT.  ? ? ? ? ?[place] ?04/23/2021, 8:02 AM ? ?

## 2021-04-23 NOTE — Progress Notes (Signed)
Physical Therapy Treatment ?Patient Details ?Name: Anna [name] ?MRN: [id number] ?DOB: July 26, 1945 ?Today's Date: 04/23/2021 ? ? ?History of Present Illness Pt is a 76 y/o female admitted secondary to R THA. PMH includes breast cancer and back surgery. ? ?  ?PT Comments  ? ? Patient making slow progress towards therapy goals. Patient continues to be limited by pain in R LE. Patient requires minA for sit to stand transfer. Ambulating with supervision but very slow and antalgic. Encouraged continued mobility with nursing staff during the day. D/c plan remains appropriate.  ?   ?Recommendations for follow up therapy are one component of a multi-disciplinary discharge planning process, led by the attending physician.  Recommendations may be updated based on patient status, additional functional criteria and insurance authorization. ? ?Follow Up Recommendations ? Follow physician's recommendations for discharge plan and follow up therapies ?  ?  ?Assistance Recommended at Discharge Intermittent Supervision/Assistance  ?Patient can return home with the following A little help with bathing/dressing/bathroom;Assistance with cooking/housework;Assist for transportation ?  ?Equipment Recommendations ? Rolling Graylyn Bunney (2 wheels);BSC/3in1  ?  ?Recommendations for Other Services   ? ? ?  ?Precautions / Restrictions Precautions ?Precautions: Fall ?Restrictions ?Weight Bearing Restrictions: Yes ?RLE Weight Bearing: Weight bearing as tolerated  ?  ? ?Mobility ? Bed Mobility ?Overal bed mobility: Needs Assistance ?Bed Mobility: Supine to Sit, Sit to Supine ?  ?  ?Supine to sit: Min assist ?Sit to supine: Min assist ?  ?General bed mobility comments: assist for R LE management in/out of bed ?  ? ?Transfers ?Overall transfer level: Needs assistance ?Equipment used: Rolling Yisell Sprunger (2 wheels) ?Transfers: Sit to/from Stand ?Sit to Stand: Min assist ?  ?  ?  ?  ?  ?General transfer comment: minA to rise from low bed surface and from toilet ?   ? ?Ambulation/Gait ?Ambulation/Gait assistance: Supervision ?Gait Distance (Feet): 80 Feet ?Assistive device: Rolling Faatimah Spielberg (2 wheels) ?Gait Pattern/deviations: Step-to pattern, Decreased step length - right, Decreased step length - left, Antalgic, Decreased stance time - right, Decreased stride length, Decreased weight shift to right ?Gait velocity: decreased ?  ?  ?General Gait Details: continues to heavily use UE support to offweight R LE due to pain. Patient complaining of burning pain in R hip. Supervision for safety. Very slow antalgic gait pattern ? ? ?Stairs ?Stairs:  (patient states she can use ramp to access home) ?  ?  ?  ?  ? ? ?Wheelchair Mobility ?  ? ?Modified Rankin (Stroke Patients Only) ?  ? ? ?  ?Balance Overall balance assessment: Needs assistance ?Sitting-balance support: No upper extremity supported, Feet supported ?Sitting balance-Leahy Scale: Good ?  ?  ?Standing balance support: Bilateral upper extremity supported ?Standing balance-Leahy Scale: Poor ?Standing balance comment: Reliant on BUE support ?  ?  ?  ?  ?  ?  ?  ?  ?  ?  ?  ?  ? ?  ?Cognition Arousal/Alertness: Awake/alert ?Behavior During Therapy: [facility] for tasks assessed/performed ?Overall Cognitive Status: Within Functional Limits for tasks assessed ?  ?  ?  ?  ?  ?  ?  ?  ?  ?  ?  ?  ?  ?  ?  ?  ?  ?  ?  ? ?  ?Exercises Total Joint Exercises ?Ankle Circles/Pumps: AROM, Both, 10 reps, Other (comment) (long sitting) ?Quad Sets: AROM, Both, 5 reps, Other (comment) (long sitting) ?Heel Slides: AROM, Left, AAROM, Right, 5 reps, Other (comment) (long sitting) ?Hip  ABduction/ADduction: AROM, Left, AAROM, Right, 5 reps, Other (comment) (long sitting) ?Other Exercises ?Other Exercises: Provided patient with surgical hip HEP with instruction on frequency and technique. ? ?  ?General Comments   ?  ?  ? ?Pertinent Vitals/Pain Pain Assessment ?Pain Assessment: Faces ?Faces Pain Scale: Hurts even more ?Pain Location: R hip ?Pain Descriptors /  Indicators: Aching, Guarding, Grimacing ?Pain Intervention(s): Monitored during session, Repositioned  ? ? ?Home Living   ?  ?  ?  ?  ?  ?  ?  ?  ?  ?   ?  ?Prior Function    ?  ?  ?   ? ?PT Goals (current goals can now be found in the care plan section) Acute Rehab PT Goals ?Patient Stated Goal: to go home ?PT Goal Formulation: With patient ?Time For Goal Achievement: 05/06/21 ?Potential to Achieve Goals: Good ?Progress towards PT goals: Progressing toward goals ? ?  ?Frequency ? ? ? 7X/week ? ? ? ?  ?PT Plan Current plan remains appropriate  ? ? ?Co-evaluation   ?  ?  ?  ?  ? ?  ?AM-PAC PT "6 Clicks" Mobility   ?Outcome Measure ? Help needed turning from your back to your side while in a flat bed without using bedrails?: A Little ?Help needed moving from lying on your back to sitting on the side of a flat bed without using bedrails?: A Little ?Help needed moving to and from a bed to a chair (including a wheelchair)?: A Little ?Help needed standing up from a chair using your arms (e.g., wheelchair or bedside chair)?: A Little ?Help needed to walk in hospital room?: A Little ?Help needed climbing 3-5 steps with a railing? : A Lot ?6 Click Score: 17 ? ?  ?End of Session Equipment Utilized During Treatment: Gait belt ?Activity Tolerance: Patient limited by pain ?Patient left: with call bell/phone within reach;in bed ?Nurse Communication: Mobility status ?PT Visit Diagnosis: Unsteadiness on feet (R26.81) ?  ? ? ?Time: 1343-1410 ?PT Time Calculation (min) (ACUTE ONLY): 27 min ? ?Charges:  $Gait Training: 23-37 mins          ?          ? ?Savi Lastinger A. Gilford Rile, PT, DPT ?Acute Rehabilitation Services ?Pager [phone] ?Office [phone] ? ? ? ?Rainn Zupko A Jamy Cleckler ?04/23/2021, 2:20 PM ? ?

## 2021-04-24 ENCOUNTER — Other Ambulatory Visit: Payer: Self-pay | Admitting: Physician Assistant

## 2021-04-24 DIAGNOSIS — M1611 Unilateral primary osteoarthritis, right hip: Secondary | ICD-10-CM | POA: Diagnosis not present

## 2021-04-24 DIAGNOSIS — Z79899 Other long term (current) drug therapy: Secondary | ICD-10-CM | POA: Diagnosis not present

## 2021-04-24 DIAGNOSIS — Z853 Personal history of malignant neoplasm of breast: Secondary | ICD-10-CM | POA: Diagnosis not present

## 2021-04-24 LAB — CBC
HCT: 26.5 % — ABNORMAL LOW (ref 36.0–46.0)
Hemoglobin: 9.1 g/dL — ABNORMAL LOW (ref 12.0–15.0)
MCH: 31.5 pg (ref 26.0–34.0)
MCHC: 34.3 g/dL (ref 30.0–36.0)
MCV: 91.7 fL (ref 80.0–100.0)
Platelets: 170 10*3/uL (ref 150–400)
RBC: 2.89 MIL/uL — ABNORMAL LOW (ref 3.87–5.11)
RDW: 13.1 % (ref 11.5–15.5)
WBC: 10.9 10*3/uL — ABNORMAL HIGH (ref 4.0–10.5)
nRBC: 0 % (ref 0.0–0.2)

## 2021-04-24 MED ORDER — OXYCODONE HCL 5 MG PO CAPS: 5.0000 mg | ORAL_CAPSULE | Freq: Four times a day (QID) | ORAL | 0 refills | Status: DC | PRN

## 2021-04-24 NOTE — Progress Notes (Signed)
Physical Therapy Treatment ?Patient Details ?Name: Anna [name] ?MRN: [id number] ?DOB: 09/05/1945 ?Today's Date: 04/24/2021 ? ? ?History of Present Illness Pt is a 76 y/o female admitted secondary to R THA. PMH includes breast cancer and back surgery. ? ?  ?PT Comments  ? ? Patient progressing towards physical therapy goals. Patient eager to return home. Patient increasing ambulation distance this session to 100' but still demos slow antalgic gait pattern. Patient encouraged to keep R LE from externally rotating during mobility and to point toes forward when stepping, good follow through. D/c plan remains appropriate.  ?   ?Recommendations for follow up therapy are one component of a multi-disciplinary discharge planning process, led by the attending physician.  Recommendations may be updated based on patient status, additional functional criteria and insurance authorization. ? ?Follow Up Recommendations ? Follow physician's recommendations for discharge plan and follow up therapies ?  ?  ?Assistance Recommended at Discharge Intermittent Supervision/Assistance  ?Patient can return home with the following A little help with bathing/dressing/bathroom;Assistance with cooking/housework;Assist for transportation ?  ?Equipment Recommendations ? Rolling Anna [name] (2 wheels);BSC/3in1  ?  ?Recommendations for Other Services   ? ? ?  ?Precautions / Restrictions Precautions ?Precautions: Fall ?Restrictions ?Weight Bearing Restrictions: Yes ?RLE Weight Bearing: Weight bearing as tolerated  ?  ? ?Mobility ? Bed Mobility ?  ?  ?  ?  ?  ?  ?  ?General bed mobility comments: up in recliner on arrival ?  ? ?Transfers ?Overall transfer level: Needs assistance ?Equipment used: Rolling Anna [name] (2 wheels) ?Transfers: Sit to/from Stand ?Sit to Stand: Min guard ?  ?  ?  ?  ?  ?General transfer comment: min guard from low recliner. Good hand placement ?  ? ?Ambulation/Gait ?Ambulation/Gait assistance: Supervision ?Gait Distance (Feet): 100  Feet ?Assistive device: Rolling Anna [name] (2 wheels) ?Gait Pattern/deviations: Step-to pattern, Decreased step length - right, Decreased step length - left, Antalgic, Decreased stance time - right, Decreased stride length, Decreased weight shift to right ?Gait velocity: decreased ?  ?  ?General Gait Details: supervision for safety. Heavy use of UE support. Continues to complain of burning pain during mobility. Seems to be moving R LE better this session ? ? ?Stairs ?  ?  ?  ?  ?  ? ? ?Wheelchair Mobility ?  ? ?Modified Rankin (Stroke Patients Only) ?  ? ? ?  ?Balance Overall balance assessment: Needs assistance ?Sitting-balance support: No upper extremity supported, Feet supported ?Sitting balance-Leahy Scale: Good ?  ?  ?Standing balance support: Bilateral upper extremity supported, Reliant on assistive device for balance ?Standing balance-Leahy Scale: Poor ?  ?  ?  ?  ?  ?  ?  ?  ?  ?  ?  ?  ?  ? ?  ?Cognition Arousal/Alertness: Awake/alert ?Behavior During Therapy: [facility] for tasks assessed/performed ?Overall Cognitive Status: Within Functional Limits for tasks assessed ?  ?  ?  ?  ?  ?  ?  ?  ?  ?  ?  ?  ?  ?  ?  ?  ?  ?  ?  ? ?  ?Exercises   ? ?  ?General Comments   ?  ?  ? ?Pertinent Vitals/Pain Pain Assessment ?Pain Assessment: Faces ?Faces Pain Scale: Hurts little more ?Pain Location: R hip ?Pain Descriptors / Indicators: Aching, Guarding, Grimacing ?Pain Intervention(s): Monitored during session, Repositioned  ? ? ?Home Living   ?  ?  ?  ?  ?  ?  ?  ?  ?  ?   ?  ?  Prior Function    ?  ?  ?   ? ?PT Goals (current goals can now be found in the care plan section) Acute Rehab PT Goals ?Patient Stated Goal: to go home ?PT Goal Formulation: With patient ?Time For Goal Achievement: 05/06/21 ?Potential to Achieve Goals: Good ?Progress towards PT goals: Progressing toward goals ? ?  ?Frequency ? ? ? 7X/week ? ? ? ?  ?PT Plan Current plan remains appropriate  ? ? ?Co-evaluation   ?  ?  ?  ?  ? ?  ?AM-PAC PT "6 Clicks"  Mobility   ?Outcome Measure ? Help needed turning from your back to your side while in a flat bed without using bedrails?: A Little ?Help needed moving from lying on your back to sitting on the side of a flat bed without using bedrails?: A Little ?Help needed moving to and from a bed to a chair (including a wheelchair)?: A Little ?Help needed standing up from a chair using your arms (e.g., wheelchair or bedside chair)?: A Little ?Help needed to walk in hospital room?: A Little ?Help needed climbing 3-5 steps with a railing? : A Lot ?6 Click Score: 17 ? ?  ?End of Session   ?Activity Tolerance: Patient tolerated treatment well ?Patient left: in chair;with call bell/phone within reach ?Nurse Communication: Mobility status ?PT Visit Diagnosis: Unsteadiness on feet (R26.81) ?  ? ? ?Time: 3244-0102 ?PT Time Calculation (min) (ACUTE ONLY): 23 min ? ?Charges:  $Gait Training: 23-37 mins          ?          ? ?Verle Brillhart A. Gilford Rile, PT, DPT ?Acute Rehabilitation Services ?Pager [phone] ?Office [phone] ? ? ? ?Marilyn Wing A Marthann Abshier ?04/24/2021, 9:23 AM ? ?

## 2021-04-24 NOTE — Progress Notes (Signed)
Patient awaiting transport via wheelchair by volunteer for discharge home; with complaints of moderate pain on her right hip and was recently medicated for it; incision on her right hip with hydrocolloid dressing and is stained but is dry and intact; room was checked for all her belongings and daughter took it along with her; discharge instructions concerning her medications, incision care, follow up appointment and when to call the doctor as needed were all discussed with patient and daughter by RN and both expressed understanding on the instructions given. ?

## 2021-04-24 NOTE — Progress Notes (Signed)
Patient ID: Anna [name], female   DOB: 1945-08-26, 76 y.o.   MRN: [id number] ?She is making a little better progress today with her mobility.  When I came by the bedside she was up with therapy.  They feel comfortable with letting her go home today.  She does have good family support.  Her vital signs are stable.  Her H&H is stable.  There is acute blood loss anemia from her surgery but she is asymptomatic from that.  Her right operative hip is stable.  She can be discharged home today. ?

## 2021-04-24 NOTE — Progress Notes (Signed)
Occupational Therapy Treatment ?Patient Details ?Name: Anna [name] ?MRN: [id number] ?DOB: Aug 12, 1945 ?Today's Date: 04/24/2021 ? ? ?History of present illness Pt is a 76 y/o female admitted secondary to R THA. PMH includes breast cancer and back surgery. ?  ?OT comments ? Patient received in supine and agreeable to OT session. Patient performed self care seated on EOB with education on reacher use to aide with LB dressing. Patient required increased time and continues to have complaints of pain with min guard to stand from EOB.  Patient was min guard to transfer to recliner with RW and was pleased by her increased mobility.  Patient is expected to return home to day and could benefit from further OT with HHOT to increase independence and safety with self care.   ? ?Recommendations for follow up therapy are one component of a multi-disciplinary discharge planning process, led by the attending physician.  Recommendations may be updated based on patient status, additional functional criteria and insurance authorization. ?   ?Follow Up Recommendations ? Home health OT  ?  ?Assistance Recommended at Discharge Frequent or constant Supervision/Assistance  ?Patient can return home with the following ? A little help with walking and/or transfers;A lot of help with bathing/dressing/bathroom;Assistance with cooking/housework;Assist for transportation;Help with stairs or ramp for entrance ?  ?Equipment Recommendations ? BSC/3in1;Other (comment)  ?  ?Recommendations for Other Services   ? ?  ?Precautions / Restrictions Precautions ?Precautions: Fall ?Restrictions ?Weight Bearing Restrictions: Yes ?RLE Weight Bearing: Weight bearing as tolerated  ? ? ?  ? ?Mobility Bed Mobility ?Overal bed mobility: Needs Assistance ?Bed Mobility: Supine to Sit ?  ?  ?Supine to sit: Min assist ?  ?  ?General bed mobility comments: required assistance managing RLE to EOB ?  ? ?Transfers ?Overall transfer level: Needs assistance ?Equipment used:  Rolling walker (2 wheels) ?Transfers: Sit to/from Stand ?Sit to Stand: Min guard ?  ?  ?  ?  ?  ?General transfer comment: min guard to stand from raised bed and transferred to recliner ?  ?  ?Balance Overall balance assessment: Needs assistance ?Sitting-balance support: No upper extremity supported, Feet supported ?Sitting balance-Leahy Scale: Good ?  ?  ?Standing balance support: Single extremity supported, Bilateral upper extremity supported, During functional activity ?Standing balance-Leahy Scale: Poor ?Standing balance comment: able to perform peri area cleaning while standing and pulling up clothing ?  ?  ?  ?  ?  ?  ?  ?  ?  ?  ?  ?   ? ?ADL either performed or assessed with clinical judgement  ? ?ADL Overall ADL's : Needs assistance/impaired ?  ?  ?  ?  ?Upper Body Bathing: Set up;Sitting ?Upper Body Bathing Details (indicate cue type and reason): performed seated on EOB ?Lower Body Bathing: Min guard;Sit to/from stand ?Lower Body Bathing Details (indicate cue type and reason): stood with RW to bathe peri area with min guard assist. Did not wash below feed due to did not want to remove compression stockings ?Upper Body Dressing : Set up;Sitting ?Upper Body Dressing Details (indicate cue type and reason): donned bra and pullover top seated on EOB ?Lower Body Dressing: Sitting/lateral leans;Sit to/from stand;Moderate assistance ?Lower Body Dressing Details (indicate cue type and reason): education on reacher use to assist with LB dressing, used sock aid to donn socks, required assistance to pull up pants ?  ?  ?  ?  ?  ?  ?  ?General ADL Comments: continues to demonstrate pain during self  care requiring min guard assist to stand from elevated surface ?  ? ?Extremity/Trunk Assessment   ?  ?  ?  ?  ?  ? ?Vision   ?  ?  ?Perception   ?  ?Praxis   ?  ? ?Cognition Arousal/Alertness: Awake/alert ?Behavior During Therapy: [facility] for tasks assessed/performed ?Overall Cognitive Status: Within Functional Limits for tasks  assessed ?  ?  ?  ?  ?  ?  ?  ?  ?  ?  ?  ?  ?  ?  ?  ?  ?General Comments: demonstrated good understanding of AE for LB dressing ?  ?  ?   ?Exercises   ? ?  ?Shoulder Instructions   ? ? ?  ?General Comments    ? ? ?Pertinent Vitals/ Pain       Pain Assessment ?Pain Assessment: Faces ?Faces Pain Scale: Hurts little more ?Pain Location: R hip ?Pain Descriptors / Indicators: Aching, Guarding, Grimacing ?Pain Intervention(s): Limited activity within patient's tolerance, Monitored during session, Repositioned ? ?Home Living   ?  ?  ?  ?  ?  ?  ?  ?  ?  ?  ?  ?  ?  ?  ?  ?  ?  ?  ? ?  ?Prior Functioning/Environment    ?  ?  ?  ?   ? ?Frequency ? Min 5X/week  ? ? ? ? ?  ?Progress Toward Goals ? ?OT Goals(current goals can now be found in the care plan section) ? Progress towards OT goals: Progressing toward goals ? ?Acute Rehab OT Goals ?Patient Stated Goal: go home ?OT Goal Formulation: With patient ?Time For Goal Achievement: 05/07/21 ?Potential to Achieve Goals: Good ?ADL Goals ?Pt Will Perform Lower Body Bathing: Independently;sitting/lateral leans;sit to/from stand;with adaptive equipment ?Pt Will Perform Lower Body Dressing: Independently;with adaptive equipment;sitting/lateral leans;sit to/from stand ?Pt Will Transfer to Toilet: Independently;regular height toilet;ambulating ?Pt Will Perform Toileting - Clothing Manipulation and hygiene: Independently;sitting/lateral leans;sit to/from stand  ?Plan Discharge plan remains appropriate   ? ?Co-evaluation ? ? ?   ?  ?  ?  ?  ? ?  ?AM-PAC OT "6 Clicks" Daily Activity     ?Outcome Measure ? ? Help from another person eating meals?: A Little ?Help from another person taking care of personal grooming?: A Little ?Help from another person toileting, which includes using toliet, bedpan, or urinal?: A Lot ?Help from another person bathing (including washing, rinsing, drying)?: A Little ?Help from another person to put on and taking off regular upper body clothing?: A  Little ?Help from another person to put on and taking off regular lower body clothing?: A Lot ?6 Click Score: 16 ? ?  ?End of Session Equipment Utilized During Treatment: Rolling walker (2 wheels);Other (comment) (AE) ? ?OT Visit Diagnosis: Other abnormalities of gait and mobility (R26.89);Unsteadiness on feet (R26.81);Muscle weakness (generalized) (M62.81);Pain ?Pain - Right/Left: Right ?Pain - part of body: Hip ?  ?Activity Tolerance Patient limited by pain;Patient tolerated treatment well ?  ?Patient Left in chair;with call bell/phone within reach ?  ?Nurse Communication Mobility status ?  ? ?   ? ?Time: 0254-2706 ?OT Time Calculation (min): 40 min ? ?Charges: OT General Charges ?$OT Visit: 1 Visit ?OT Treatments ?$Self Care/Home Management : 38-52 mins ? ?Lodema Hong, OTA ?Acute Rehabilitation Services  ?Pager [phone] ?Office [phone] ? ? ?[place] ?04/24/2021, 8:34 AM ?

## 2021-04-24 NOTE — Discharge Summary (Signed)
?Patient ID: ?Anna [name] ?MRN: [id number] ?DOB/AGE: 1946-01-22 76 y.o. ? ?Admit date: 04/22/2021 ?Discharge date: 04/24/2021 ? ?Admission Diagnoses:  ?Principal Problem: ?  Unilateral primary osteoarthritis, right hip ?Active Problems: ?  Status post total replacement of right hip ? ? ?Discharge Diagnoses:  ?Same ? ?Past Medical History:  ?Diagnosis Date  ? Arthritis   ? Breast cancer ([place])   ? age 25  ? Family history of adverse reaction to anesthesia   ? Son takes longer to wake  ? Headache   ? Hyperlipidemia   ? Osteopenia   ? Vision abnormalities   ? ? ?Surgeries: Procedure(s): ?Right TOTAL HIP ARTHROPLASTY ANTERIOR APPROACH on 04/22/2021 ?  ?Consultants:  ? ?Discharged Condition: Improved ? ?Hospital Course: Anna [name] is an 76 y.o. female who was admitted 04/22/2021 for operative treatment ofUnilateral primary osteoarthritis, right hip. Patient has severe unremitting pain that affects sleep, daily activities, and work/hobbies. After pre-op clearance the patient was taken to the operating room on 04/22/2021 and underwent  Procedure(s): ?Right TOTAL HIP ARTHROPLASTY ANTERIOR APPROACH.   ? ?Patient was given perioperative antibiotics:  ?Anti-infectives (From admission, onward)  ? ? Start     Dose/Rate Route Frequency Ordered Stop  ? 04/22/21 1130  ceFAZolin (ANCEF) IVPB 1 g/50 mL premix       ? 1 g ?100 mL/hr over 30 Minutes Intravenous Every 6 hours 04/22/21 1034 04/23/21 1450  ? 04/22/21 0600  ceFAZolin (ANCEF) IVPB 2g/100 mL premix       ? 2 g ?200 mL/hr over 30 Minutes Intravenous On call to O.R. 04/22/21 0552 04/23/21 1729  ? ?  ?  ? ?Patient was given sequential compression devices, early ambulation, and chemoprophylaxis to prevent DVT. ? ?Patient benefited maximally from hospital stay and there were no complications.   ? ?Recent vital signs: Patient Vitals for the past 24 hrs: ? BP Temp Temp src Pulse Resp SpO2  ?04/24/21 0342 (!) 107/52 99.6 ?F (37.6 ?C) Oral 99 18 95 %  ?04/23/21 2256 (!) 97/53 99.5 ?F  (37.5 ?C) -- 95 18 95 %  ?04/23/21 1955 (!) 92/53 99.1 ?F (37.3 ?C) Oral 96 18 100 %  ?04/23/21 1552 102/60 99.6 ?F (37.6 ?C) Oral 92 18 97 %  ?04/23/21 1132 98/60 99 ?F (37.2 ?C) Oral 86 18 100 %  ?  ? ?Recent laboratory studies:  ?Recent Labs  ?  04/23/21 ?0705 04/24/21 ?0642  ?WBC 11.5* 10.9*  ?HGB 9.8* 9.1*  ?HCT 29.4* 26.5*  ?PLT 197 170  ?NA 136  --   ?K 3.7  --   ?CL 102  --   ?CO2 27  --   ?BUN 16  --   ?CREATININE 0.87  --   ?GLUCOSE 147*  --   ?CALCIUM 8.9  --   ? ? ? ?Discharge Medications:   ?Allergies as of 04/24/2021   ? ?   Reactions  ? Septra [sulfamethoxazole-trimethoprim]   ? Other reaction(s): itching  ? Sulfa Antibiotics Itching  ? ?  ? ?  ?Medication List  ?  ? ?TAKE these medications   ? ?Acetaminophen 500 MG capsule ?Take 500-1,000 mg by mouth daily. Take 1000 mg in the morning and 500 mg at bedtime ?  ?ASPERCREME LIDOCAINE EX ?Apply 1 application. topically daily as needed (Pain). ?  ?aspirin 81 MG chewable tablet ?Chew 1 tablet (81 mg total) by mouth 2 (two) times daily. ?  ?Calcium 600-200 MG-UNIT tablet ?Take 2 tablets by mouth daily. ?  ?famotidine  40 MG tablet ?Commonly known as: PEPCID ?Take 40 mg by mouth at bedtime. ?  ?ferrous sulfate 324 MG Tbec ?Take 324 mg by mouth daily with breakfast. ?  ?levothyroxine 100 MCG tablet ?Commonly known as: SYNTHROID ?Take 100 mcg by mouth daily. ?  ?lidocaine 5 % ?Commonly known as: Lidoderm ?Place 1 patch onto the skin daily. Remove & Discard patch within 12 hours or as directed by MD ?What changed:  ?when to take this ?reasons to take this ?  ?methocarbamol 500 MG tablet ?Commonly known as: ROBAXIN ?Take 1 tablet (500 mg total) by mouth every 6 (six) hours as needed for muscle spasms. ?  ?omeprazole 20 MG capsule ?Commonly known as: PRILOSEC ?Take 1 capsule (20 mg total) by mouth daily. ?  ?OVER THE COUNTER MEDICATION ?Place 1 drop into both eyes daily as needed (Dry eye). Dry eye formula ?  ?oxyCODONE 5 MG immediate release tablet ?Commonly known  as: Oxy IR/ROXICODONE ?Take 1-2 tablets (5-10 mg total) by mouth every 4 (four) hours as needed for moderate pain (pain score 4-6). ?  ?pravastatin 40 MG tablet ?Commonly known as: PRAVACHOL ?Take 40 mg by mouth daily. ?  ?SUMAtriptan 25 MG tablet ?Commonly known as: IMITREX ?Take 1 tab at onset of migraine.  May repeat in 2 hrs, if needed.  Max dose: 2 tabs/day. This is a 30 day prescription. ?  ?Vitamin D-3 125 MCG (5000 UT) Tabs ?Take 5,000 Units by mouth daily. ?  ? ?  ? ?  ?  ? ? ?  ?Durable Medical Equipment  ?(From admission, onward)  ?  ? ? ?  ? ?  Start     Ordered  ? 04/22/21 1035  DME 3 n 1  Once       ? 04/22/21 1034  ? 04/22/21 1035  DME Walker rolling  Once       ?Question Answer Comment  ?Walker: With 5 Inch Wheels   ?Patient needs a walker to treat with the following condition Status post total replacement of right hip   ?  ? 04/22/21 1034  ? ?  ?  ? ?  ? ? ?Diagnostic Studies: DG Pelvis Portable ? ?Result Date: 04/22/2021 ?CLINICAL DATA:  Status post total knee replacement of right hip. EXAM: PORTABLE PELVIS 1-2 VIEWS COMPARISON:  Intraoperative fluoroscopic images of the right hip 04/22/2021. Radiographs of the right hip 02/10/2021. FINDINGS: Postoperative changes from recent right total hip arthroplasty. The femoral and acetabular components appear well seated. No unexpected finding on this single AP view radiograph. Overlying skin staples. Incidentally noted calcified uterine fibroids. IMPRESSION: Postoperative changes from recent right total hip arthroplasty. No unexpected finding on this single AP view radiograph. Electronically Signed   By: Kellie Simmering D.O.   On: 04/22/2021 09:46  ? ?DG C-Arm 1-60 Min-No Report ? ?Result Date: 04/22/2021 ?Fluoroscopy was utilized by the requesting physician.  No radiographic interpretation.  ? ?DG C-Arm 1-60 Min-No Report ? ?Result Date: 04/22/2021 ?Fluoroscopy was utilized by the requesting physician.  No radiographic interpretation.  ? ?DG HIP UNILAT WITH  PELVIS 1V RIGHT ? ?Result Date: 04/22/2021 ?CLINICAL DATA:  Right hip arthroplasty EXAM: DG HIP (WITH OR WITHOUT PELVIS) 1V RIGHT COMPARISON:  Hip radiograph 02/10/2021 FINDINGS: Eight C-arm fluoroscopic images were obtained intraoperatively and submitted for post operative interpretation. Initial images demonstrate marked degenerative change about the right hip. Subsequent images demonstrate postsurgical changes reflecting right hip arthroplasty. Hardware alignment is within expected limits, without evidence of complication. Degenerative changes  are also noted about the left hip. The symphysis pubis is intact. A calcified uterine fibroid is noted. Fluoro time 20 seconds, dose 2.44 mGy. Please see the performing provider's procedural report for further detail. IMPRESSION: Status post right hip arthroplasty without evidence of immediate complication. Electronically Signed   By: Valetta Mole M.D.   On: 04/22/2021 09:17   ? ?Disposition: Discharge disposition: 01-Home or Self Care ? ? ? ? ? ? ? ? ? Follow-up Information   ? ? [facility]   ?Specialty: Home Health Services ?Contact information: ?[address] ?[address] ?[place] [address] ?[phone] ? ? ?  ?  ? ? [name], MD. Go on 05/07/2021.   ?Specialty: Orthopedic Surgery ?Why: at 10:30 am for your first in office visit with Dr. Ninfa Linden ?Contact information: ?[address] ?[place] [address] ?[phone] ? ? ?  ?  ? ?  ?  ? ?  ? ? ? ?Signed: ?[name] ?04/24/2021, 8:22 AM ? ?  ?

## 2021-04-24 NOTE — Plan of Care (Signed)
?  Problem: Safety: ?Goal: Ability to remain free from injury will improve ?Outcome: Completed/Met ?  ?Problem: Education: ?Goal: Knowledge of the prescribed therapeutic regimen will improve ?Outcome: Completed/Met ?Goal: Understanding of discharge needs will improve ?Outcome: Completed/Met ?Goal: Individualized Educational Video(s) ?Outcome: Completed/Met ?  ?Problem: Activity: ?Goal: Ability to avoid complications of mobility impairment will improve ?Outcome: Completed/Met ?Goal: Ability to tolerate increased activity will improve ?Outcome: Completed/Met ?  ?Problem: Clinical Measurements: ?Goal: Postoperative complications will be avoided or minimized ?Outcome: Completed/Met ?  ?Problem: Pain Management: ?Goal: Pain level will decrease with appropriate interventions ?Outcome: Completed/Met ?  ?Problem: Skin Integrity: ?Goal: Will show signs of wound healing ?Outcome: Completed/Met ?  ?

## 2021-04-25 ENCOUNTER — Telehealth: Payer: Self-pay | Admitting: *Deleted

## 2021-04-25 DIAGNOSIS — M8580 Other specified disorders of bone density and structure, unspecified site: Secondary | ICD-10-CM | POA: Diagnosis not present

## 2021-04-25 DIAGNOSIS — Z853 Personal history of malignant neoplasm of breast: Secondary | ICD-10-CM | POA: Diagnosis not present

## 2021-04-25 DIAGNOSIS — M1711 Unilateral primary osteoarthritis, right knee: Secondary | ICD-10-CM | POA: Diagnosis not present

## 2021-04-25 DIAGNOSIS — E782 Mixed hyperlipidemia: Secondary | ICD-10-CM | POA: Diagnosis not present

## 2021-04-25 DIAGNOSIS — G8929 Other chronic pain: Secondary | ICD-10-CM | POA: Diagnosis not present

## 2021-04-25 DIAGNOSIS — Z471 Aftercare following joint replacement surgery: Secondary | ICD-10-CM | POA: Diagnosis not present

## 2021-04-25 DIAGNOSIS — Z9181 History of falling: Secondary | ICD-10-CM | POA: Diagnosis not present

## 2021-04-25 DIAGNOSIS — Z7982 Long term (current) use of aspirin: Secondary | ICD-10-CM | POA: Diagnosis not present

## 2021-04-25 DIAGNOSIS — G43709 Chronic migraine without aura, not intractable, without status migrainosus: Secondary | ICD-10-CM | POA: Diagnosis not present

## 2021-04-25 DIAGNOSIS — M25511 Pain in right shoulder: Secondary | ICD-10-CM | POA: Diagnosis not present

## 2021-04-25 DIAGNOSIS — Z96641 Presence of right artificial hip joint: Secondary | ICD-10-CM | POA: Diagnosis not present

## 2021-04-25 NOTE — Telephone Encounter (Signed)
Ortho bundle D/C call completed. 

## 2021-04-28 DIAGNOSIS — M8580 Other specified disorders of bone density and structure, unspecified site: Secondary | ICD-10-CM | POA: Diagnosis not present

## 2021-04-28 DIAGNOSIS — G8929 Other chronic pain: Secondary | ICD-10-CM | POA: Diagnosis not present

## 2021-04-28 DIAGNOSIS — Z7982 Long term (current) use of aspirin: Secondary | ICD-10-CM | POA: Diagnosis not present

## 2021-04-28 DIAGNOSIS — E782 Mixed hyperlipidemia: Secondary | ICD-10-CM | POA: Diagnosis not present

## 2021-04-28 DIAGNOSIS — M25511 Pain in right shoulder: Secondary | ICD-10-CM | POA: Diagnosis not present

## 2021-04-28 DIAGNOSIS — Z853 Personal history of malignant neoplasm of breast: Secondary | ICD-10-CM | POA: Diagnosis not present

## 2021-04-28 DIAGNOSIS — M1711 Unilateral primary osteoarthritis, right knee: Secondary | ICD-10-CM | POA: Diagnosis not present

## 2021-04-28 DIAGNOSIS — Z96641 Presence of right artificial hip joint: Secondary | ICD-10-CM | POA: Diagnosis not present

## 2021-04-28 DIAGNOSIS — Z9181 History of falling: Secondary | ICD-10-CM | POA: Diagnosis not present

## 2021-04-28 DIAGNOSIS — Z471 Aftercare following joint replacement surgery: Secondary | ICD-10-CM | POA: Diagnosis not present

## 2021-04-28 DIAGNOSIS — G43709 Chronic migraine without aura, not intractable, without status migrainosus: Secondary | ICD-10-CM | POA: Diagnosis not present

## 2021-04-30 ENCOUNTER — Telehealth: Payer: Self-pay | Admitting: *Deleted

## 2021-04-30 DIAGNOSIS — E782 Mixed hyperlipidemia: Secondary | ICD-10-CM | POA: Diagnosis not present

## 2021-04-30 DIAGNOSIS — Z471 Aftercare following joint replacement surgery: Secondary | ICD-10-CM | POA: Diagnosis not present

## 2021-04-30 DIAGNOSIS — G43709 Chronic migraine without aura, not intractable, without status migrainosus: Secondary | ICD-10-CM | POA: Diagnosis not present

## 2021-04-30 DIAGNOSIS — M8580 Other specified disorders of bone density and structure, unspecified site: Secondary | ICD-10-CM | POA: Diagnosis not present

## 2021-04-30 DIAGNOSIS — M1711 Unilateral primary osteoarthritis, right knee: Secondary | ICD-10-CM | POA: Diagnosis not present

## 2021-04-30 DIAGNOSIS — Z853 Personal history of malignant neoplasm of breast: Secondary | ICD-10-CM | POA: Diagnosis not present

## 2021-04-30 DIAGNOSIS — M25511 Pain in right shoulder: Secondary | ICD-10-CM | POA: Diagnosis not present

## 2021-04-30 DIAGNOSIS — Z96641 Presence of right artificial hip joint: Secondary | ICD-10-CM | POA: Diagnosis not present

## 2021-04-30 DIAGNOSIS — Z9181 History of falling: Secondary | ICD-10-CM | POA: Diagnosis not present

## 2021-04-30 DIAGNOSIS — Z7982 Long term (current) use of aspirin: Secondary | ICD-10-CM | POA: Diagnosis not present

## 2021-04-30 DIAGNOSIS — G8929 Other chronic pain: Secondary | ICD-10-CM | POA: Diagnosis not present

## 2021-04-30 NOTE — Telephone Encounter (Signed)
7 day Ortho bundle call completed. Doing well overall. F/U next week. No CM needs at this time. ?

## 2021-05-02 DIAGNOSIS — Z96641 Presence of right artificial hip joint: Secondary | ICD-10-CM | POA: Diagnosis not present

## 2021-05-02 DIAGNOSIS — Z7982 Long term (current) use of aspirin: Secondary | ICD-10-CM | POA: Diagnosis not present

## 2021-05-02 DIAGNOSIS — M1711 Unilateral primary osteoarthritis, right knee: Secondary | ICD-10-CM | POA: Diagnosis not present

## 2021-05-02 DIAGNOSIS — M25511 Pain in right shoulder: Secondary | ICD-10-CM | POA: Diagnosis not present

## 2021-05-02 DIAGNOSIS — Z853 Personal history of malignant neoplasm of breast: Secondary | ICD-10-CM | POA: Diagnosis not present

## 2021-05-02 DIAGNOSIS — G43709 Chronic migraine without aura, not intractable, without status migrainosus: Secondary | ICD-10-CM | POA: Diagnosis not present

## 2021-05-02 DIAGNOSIS — E782 Mixed hyperlipidemia: Secondary | ICD-10-CM | POA: Diagnosis not present

## 2021-05-02 DIAGNOSIS — Z9181 History of falling: Secondary | ICD-10-CM | POA: Diagnosis not present

## 2021-05-02 DIAGNOSIS — G8929 Other chronic pain: Secondary | ICD-10-CM | POA: Diagnosis not present

## 2021-05-02 DIAGNOSIS — M8580 Other specified disorders of bone density and structure, unspecified site: Secondary | ICD-10-CM | POA: Diagnosis not present

## 2021-05-02 DIAGNOSIS — Z471 Aftercare following joint replacement surgery: Secondary | ICD-10-CM | POA: Diagnosis not present

## 2021-05-05 DIAGNOSIS — Z471 Aftercare following joint replacement surgery: Secondary | ICD-10-CM | POA: Diagnosis not present

## 2021-05-05 DIAGNOSIS — M1711 Unilateral primary osteoarthritis, right knee: Secondary | ICD-10-CM | POA: Diagnosis not present

## 2021-05-05 DIAGNOSIS — Z9181 History of falling: Secondary | ICD-10-CM | POA: Diagnosis not present

## 2021-05-05 DIAGNOSIS — G8929 Other chronic pain: Secondary | ICD-10-CM | POA: Diagnosis not present

## 2021-05-05 DIAGNOSIS — M8580 Other specified disorders of bone density and structure, unspecified site: Secondary | ICD-10-CM | POA: Diagnosis not present

## 2021-05-05 DIAGNOSIS — G43709 Chronic migraine without aura, not intractable, without status migrainosus: Secondary | ICD-10-CM | POA: Diagnosis not present

## 2021-05-05 DIAGNOSIS — Z7982 Long term (current) use of aspirin: Secondary | ICD-10-CM | POA: Diagnosis not present

## 2021-05-05 DIAGNOSIS — M25511 Pain in right shoulder: Secondary | ICD-10-CM | POA: Diagnosis not present

## 2021-05-05 DIAGNOSIS — Z853 Personal history of malignant neoplasm of breast: Secondary | ICD-10-CM | POA: Diagnosis not present

## 2021-05-05 DIAGNOSIS — Z96641 Presence of right artificial hip joint: Secondary | ICD-10-CM | POA: Diagnosis not present

## 2021-05-05 DIAGNOSIS — E782 Mixed hyperlipidemia: Secondary | ICD-10-CM | POA: Diagnosis not present

## 2021-05-07 ENCOUNTER — Ambulatory Visit (INDEPENDENT_AMBULATORY_CARE_PROVIDER_SITE_OTHER): Payer: Medicare Other | Admitting: Orthopaedic Surgery

## 2021-05-07 ENCOUNTER — Telehealth: Payer: Self-pay | Admitting: *Deleted

## 2021-05-07 DIAGNOSIS — Z96641 Presence of right artificial hip joint: Secondary | ICD-10-CM

## 2021-05-07 NOTE — Telephone Encounter (Signed)
Ortho bundle 14 day in office meeting today. 

## 2021-05-07 NOTE — Progress Notes (Signed)
HPI: Ms. Grossberg returns today status post right total hip arthroplasty 04/22/2021.  She is overall doing well she is ambulating with a cane.  States her range of motion and strength improving she does have some buttocks pain right knee pain.  She is no longer taking any pain medication.  She is also taking aspirin for DVT prophylaxis she was on no aspirin prior to surgery.  She has been taking occasional Robaxin.  She denies any fevers chills shortness of breath chest pain. ? ?Physical exam: Right hip surgical incisions healing well.  Staples well proximal skin.  No signs of dehiscence.  Action.  Right hip fluid motion with internal rotation external rotation.  Calf supple nontender dorsiflexion plantarflexion right ankle intact.  Ambulates with a slight antalgic gait with cane. ? ?Impression: Status post right total hip arthroplasty ? ?Plan: Staples removed Steri-Strips applied.  Scar tissue mobilization encouraged.  Incision wet in shower.  She will remain on an aspirin once daily for another 2 weeks and then discontinue as she is on no prior to surgery.  She is on no pain medications at this point in time.  She will follow-up with [place] in 1 month sooner if there is any questions concerns. ?

## 2021-05-08 DIAGNOSIS — G43709 Chronic migraine without aura, not intractable, without status migrainosus: Secondary | ICD-10-CM | POA: Diagnosis not present

## 2021-05-08 DIAGNOSIS — Z96641 Presence of right artificial hip joint: Secondary | ICD-10-CM | POA: Diagnosis not present

## 2021-05-08 DIAGNOSIS — M8580 Other specified disorders of bone density and structure, unspecified site: Secondary | ICD-10-CM | POA: Diagnosis not present

## 2021-05-08 DIAGNOSIS — Z853 Personal history of malignant neoplasm of breast: Secondary | ICD-10-CM | POA: Diagnosis not present

## 2021-05-08 DIAGNOSIS — Z7982 Long term (current) use of aspirin: Secondary | ICD-10-CM | POA: Diagnosis not present

## 2021-05-08 DIAGNOSIS — E782 Mixed hyperlipidemia: Secondary | ICD-10-CM | POA: Diagnosis not present

## 2021-05-08 DIAGNOSIS — M1711 Unilateral primary osteoarthritis, right knee: Secondary | ICD-10-CM | POA: Diagnosis not present

## 2021-05-08 DIAGNOSIS — M25511 Pain in right shoulder: Secondary | ICD-10-CM | POA: Diagnosis not present

## 2021-05-08 DIAGNOSIS — Z471 Aftercare following joint replacement surgery: Secondary | ICD-10-CM | POA: Diagnosis not present

## 2021-05-08 DIAGNOSIS — Z9181 History of falling: Secondary | ICD-10-CM | POA: Diagnosis not present

## 2021-05-08 DIAGNOSIS — G8929 Other chronic pain: Secondary | ICD-10-CM | POA: Diagnosis not present

## 2021-06-04 ENCOUNTER — Ambulatory Visit (INDEPENDENT_AMBULATORY_CARE_PROVIDER_SITE_OTHER): Payer: Medicare Other | Admitting: Orthopaedic Surgery

## 2021-06-04 ENCOUNTER — Encounter: Payer: Self-pay | Admitting: Orthopaedic Surgery

## 2021-06-04 DIAGNOSIS — Z96641 Presence of right artificial hip joint: Secondary | ICD-10-CM

## 2021-06-04 NOTE — Progress Notes (Signed)
The patient is now about 6 weeks status post a right total hip arthroplasty.  She is able to with a cane.  She is driving.  She is 76 years old and active.  She does report pain around the IT band and some numbness and tingling.  She does report improving her range of motion and strength overall. ? ?She tolerates removing her right hip around.  There is a little bit of pain in the groin area and pain at her incision but her incision looks good.  There is just some slight stiffness. ? ?I have recommended Voltaren gel around her hip and I think that will help 2-3 times a day.  We will see her back in 4 weeks to see how she is doing overall but no x-rays are needed. ?

## 2021-06-10 ENCOUNTER — Ambulatory Visit: Payer: Self-pay

## 2021-06-10 ENCOUNTER — Encounter: Payer: Self-pay | Admitting: Orthopedic Surgery

## 2021-06-10 ENCOUNTER — Ambulatory Visit (INDEPENDENT_AMBULATORY_CARE_PROVIDER_SITE_OTHER): Payer: Medicare Other

## 2021-06-10 ENCOUNTER — Ambulatory Visit: Payer: Medicare Other | Admitting: Orthopedic Surgery

## 2021-06-10 DIAGNOSIS — M79644 Pain in right finger(s): Secondary | ICD-10-CM

## 2021-06-10 DIAGNOSIS — G8929 Other chronic pain: Secondary | ICD-10-CM

## 2021-06-10 DIAGNOSIS — M67442 Ganglion, left hand: Secondary | ICD-10-CM

## 2021-06-10 DIAGNOSIS — M79645 Pain in left finger(s): Secondary | ICD-10-CM

## 2021-06-10 DIAGNOSIS — M1812 Unilateral primary osteoarthritis of first carpometacarpal joint, left hand: Secondary | ICD-10-CM | POA: Insufficient documentation

## 2021-06-10 DIAGNOSIS — M67449 Ganglion, unspecified hand: Secondary | ICD-10-CM | POA: Insufficient documentation

## 2021-06-10 DIAGNOSIS — D509 Iron deficiency anemia, unspecified: Secondary | ICD-10-CM | POA: Diagnosis not present

## 2021-06-10 DIAGNOSIS — E7800 Pure hypercholesterolemia, unspecified: Secondary | ICD-10-CM | POA: Diagnosis not present

## 2021-06-10 NOTE — Progress Notes (Signed)
? ?Office Visit Note ?  ?Patient: Anna [name]           ?Date of Birth: 09-24-1945           ?MRN: [id number] ?Visit Date: 06/10/2021 ?             ?Requested by: [name], MD ?[place]   [id number] ?PCP: [name], MD ? ? ?Assessment & Plan: ?Visit Diagnoses:  ?1. Chronic pain of left thumb   ?2. Chronic pain of right thumb   ?3. 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(CMC) joint of left thumb   ?4. Digital mucous cyst of finger   ? ? ?Plan: Discussed with patient that she has osteoarthritis of the left thumb at the [facility] joint with mild degenerative change present at the MP joint also.  She also has a small mass at the dorsal radial aspect of the right thumb at the IP joint that seems consistent with a mucous cyst.  We reviewed the nature of thumb CMC arthritis as well as its diagnosis, prognosis, and both conservative and surgical treatment options.  After our discussion, she would like to continue with her topical creams and Tylenol.  She is not interested in a brace or corticosteroid injection yet.  She has no pain associated with the right thumb IP joint or the small lesion.  She like to continue observe this for now as well.  She can come back in the office as needed. ? ?Follow-Up Instructions: No follow-ups on file.  ? ?Orders:  ?Orders Placed This Encounter  ?Procedures  ? XR Finger Thumb Right  ? XR Finger Thumb Left  ? ?No orders of the defined types were placed in this encounter. ? ? ? ? Procedures: ?No procedures performed ? ? ?Clinical Data: ?No additional findings. ? ? ?Subjective: ?Chief Complaint  ?Patient presents with  ? Left Hand - New Patient (Initial Visit)  ? Right Hand - New Patient (Initial Visit)  ? ? ?This is a 76 year old right-hand-dominant female presents with complaints involving both thumbs.  The left thumb she describes pain at the base of the thumb at the area of the [facility] joint.  This been going on for years now.  She notes that is not worsening but  has been unchanged for the last several years.  Her pain is worse with prolonged activity and localized only to the base of the thumb.  She has been using various lidocaine creams and taking oral Tylenol with modest symptom relief.  She denies any pain at the MP joint.  On the right side, she noticed a small mass to the dorsal radial aspect of the thumb around the IP joint.  This is not painful.  She has no pain with range of motion of the thumb.  This was just noticed incidentally. ? ? ?Review of Systems ? ? ?Objective: ?Vital Signs: There were no vitals taken for this visit. ? ?Physical Exam ?Constitutional:   ?   Appearance: Normal appearance.  ?Cardiovascular:  ?   Rate and Rhythm: Normal rate.  ?   Pulses: Normal pulses.  ?Pulmonary:  ?   Effort: Pulmonary effort is normal.  ?Skin: ?   General: Skin is warm and dry.  ?   Capillary Refill: Capillary refill takes less than 2 seconds.  ?Neurological:  ?   Mental Status: She is alert.  ? ? ?Right Hand Exam  ? ?Tenderness  ?The patient is experiencing no tenderness.  ? ?Range of Motion  ?  The patient has normal right wrist ROM.  ? ?Other  ?Erythema: absent ?Sensation: normal ?Pulse: present ? ? ?Left Hand Exam  ? ?Tenderness  ?Left hand tenderness location: TTP at thumb [facility] joint w/ mild to moderate swelling.  ? ?Other  ?Erythema: absent ?Sensation: normal ?Pulse: present ? ?Comments:  Pain and crepitus with CMC grind test.  No pain w/ ROM of MP joint.  No static or dynamic MP hyper-extension.  No palmar abduction contracture. Negative Finkelstein test.  No thumb triggering.  ? ? ? ? ?Specialty Comments:  ?No specialty comments available. ? ?Imaging: ?No results found. ? ? ?PMFS History: ?Patient Active Problem List  ? Diagnosis Date Noted  ? 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[facility]) joint of left thumb 06/10/2021  ? Digital mucous cyst of finger 06/10/2021  ? 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 Mixed hyperlipidemia 02/06/2021  ? Musculoskeletal chest  pain 02/06/2021  ? Obesity (BMI 30-39.9) 02/06/2021  ? Unilateral primary osteoarthritis, right hip 11/06/2020  ? Unilateral primary osteoarthritis, right knee 11/06/2020  ? Chest pain of uncertain etiology 47/10/6281  ? Cardiac murmur 08/29/2020  ? Mixed dyslipidemia 08/29/2020  ? Arthritis 08/16/2020  ? Breast cancer ([place]) 08/16/2020  ? Headache 08/16/2020  ? Osteopenia 08/16/2020  ? Vision abnormalities 08/16/2020  ? Chronic right shoulder pain 03/07/2018  ? Chronic migraine 08/11/2017  ? ?Past Medical History:  ?Diagnosis Date  ? Arthritis   ? Breast cancer ([place])   ? age 49  ? Family history of adverse reaction to anesthesia   ? Son takes longer to wake  ? Headache   ? Hyperlipidemia   ? Osteopenia   ? Vision abnormalities   ?  ?Family History  ?Problem Relation Age of Onset  ? Hyperlipidemia Mother   ? Hypertension Mother   ? Alzheimer's disease Father   ? Hypertension Sister   ? Hypertension Sister   ? Hypertension Sister   ? Hypertension Brother   ? Lung cancer Brother   ? Hypertension Sister   ? Hypertension Brother   ?  ?Past Surgical History:  ?Procedure Laterality Date  ? BACK SURGERY    ? lumbar region  ? BUNIONECTOMY Bilateral   ? MASTECTOMY Left   ? at age 72  ? TOTAL HIP ARTHROPLASTY Right 04/22/2021  ? Procedure: Right TOTAL HIP ARTHROPLASTY ANTERIOR APPROACH;  Surgeon: [name], MD;  Location: [place];  Service: Orthopedics;  Laterality: Right;  ? TRIGGER FINGER RELEASE Left   ? left thumb  ? TUBAL LIGATION    ? ?Social History  ? ?Occupational History  ? Not on file  ?Tobacco Use  ? Smoking status: Never  ? Smokeless tobacco: Never  ?Vaping Use  ? Vaping Use: Never used  ?Substance and Sexual Activity  ? Alcohol use: Never  ? Drug use: Never  ? Sexual activity: Not Currently  ? ? ? ? ? ? ?

## 2021-06-18 DIAGNOSIS — K219 Gastro-esophageal reflux disease without esophagitis: Secondary | ICD-10-CM | POA: Diagnosis not present

## 2021-06-18 DIAGNOSIS — E7800 Pure hypercholesterolemia, unspecified: Secondary | ICD-10-CM | POA: Diagnosis not present

## 2021-06-18 DIAGNOSIS — D509 Iron deficiency anemia, unspecified: Secondary | ICD-10-CM | POA: Diagnosis not present

## 2021-06-18 DIAGNOSIS — E039 Hypothyroidism, unspecified: Secondary | ICD-10-CM | POA: Diagnosis not present

## 2021-06-18 DIAGNOSIS — E559 Vitamin D deficiency, unspecified: Secondary | ICD-10-CM | POA: Diagnosis not present

## 2021-07-02 ENCOUNTER — Telehealth: Payer: Self-pay | Admitting: *Deleted

## 2021-07-02 ENCOUNTER — Encounter: Payer: Self-pay | Admitting: Orthopaedic Surgery

## 2021-07-02 ENCOUNTER — Ambulatory Visit (INDEPENDENT_AMBULATORY_CARE_PROVIDER_SITE_OTHER): Payer: Medicare Other | Admitting: Orthopaedic Surgery

## 2021-07-02 DIAGNOSIS — Z96641 Presence of right artificial hip joint: Secondary | ICD-10-CM

## 2021-07-02 NOTE — Telephone Encounter (Signed)
Ortho bundle 30 day call and survey completed. ?

## 2021-07-02 NOTE — Progress Notes (Signed)
The patient is now 10 weeks status post a right total hip arthroplasty.  She is ambling with a cane.  She is an active 75 years old.  She is driving.  She reports improved range of motion and strength and still has a tender area at the top of her incision but no issues with it other than just being tender.  She does report some numbness as well.  I can move her operative hip around easily without any issues at all from my standpoint.  She does exhibit good strength as well.  I did let her know that the numbness should slowly improve with time.  At this standpoint we do not need to see her back for 6 months unless there are issues.  I would like a standing AP pelvis and lateral of her right operative hip at that visit.

## 2021-07-05 ENCOUNTER — Ambulatory Visit
Admission: EM | Admit: 2021-07-05 | Discharge: 2021-07-05 | Disposition: A | Discharge status: Home / Self Care | Payer: Medicare Other | Attending: Internal Medicine | Admitting: Internal Medicine

## 2021-07-05 ENCOUNTER — Encounter: Payer: Self-pay | Admitting: Emergency Medicine

## 2021-07-05 DIAGNOSIS — S61211A Laceration without foreign body of left index finger without damage to nail, initial encounter: Secondary | ICD-10-CM | POA: Diagnosis not present

## 2021-07-05 DIAGNOSIS — Z23 Encounter for immunization: Secondary | ICD-10-CM | POA: Diagnosis not present

## 2021-07-05 MED ORDER — TETANUS-DIPHTH-ACELL PERTUSSIS 5-2.5-18.5 LF-MCG/0.5 IM SUSY
0.5000 mL | PREFILLED_SYRINGE | Freq: Once | INTRAMUSCULAR | Status: AC
  Administered 2021-07-05: 0.5 mL via INTRAMUSCULAR

## 2021-07-05 NOTE — ED Provider Notes (Signed)
[facility]    CSN: [id number] Arrival date & time: 07/05/21  1306      History   Chief Complaint Chief Complaint  Patient presents with   Laceration    HPI Anna [name] is a 76 y.o. female.   Patient presents with laceration to left index finger that occurred approximately 2 hours prior to arrival to urgent care.  Patient reports that she was cutting chicken with a knife when it slipped and hit her finger.  Denies that she takes blood thinners.  Denies numbness or tingling.  Patient has full range of motion of finger.  Denies pain to the area.  Patient is not sure when the last tetanus vaccine was.   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   Mixed hyperlipidemia 02/06/2021   Musculoskeletal chest pain 02/06/2021   Obesity (BMI 30-39.9) 02/06/2021   Unilateral primary osteoarthritis, right hip 11/06/2020   Unilateral primary osteoarthritis, right knee 11/06/2020   Chest pain of uncertain etiology 23/30/0762   Cardiac murmur 08/29/2020   Mixed dyslipidemia 08/29/2020   Arthritis 08/16/2020   Breast cancer ([place]) 08/16/2020   Headache 08/16/2020   Osteopenia 08/16/2020   Vision abnormalities 08/16/2020   Chronic right shoulder pain 03/07/2018   Chronic migraine 08/11/2017    Past Surgical History:  Procedure Laterality Date   BACK SURGERY     lumbar region   BUNIONECTOMY Bilateral    MASTECTOMY Left    at age 64   [place] Right 04/22/2021   Procedure: Right TOTAL HIP ARTHROPLASTY ANTERIOR APPROACH;  Surgeon:  [name], MD;  Location: [place];  Service: Orthopedics;  Laterality: Right;   TRIGGER FINGER RELEASE Left    left thumb   TUBAL LIGATION      OB History   No obstetric history on file.      Home Medications    Prior to Admission medications   Medication Sig Start Date End Date Taking? Authorizing Provider  Acetaminophen 500 MG capsule Take 500-1,000 mg by mouth daily. Take 1000 mg in the morning and 500 mg at bedtime    [provider]  ASPERCREME LIDOCAINE EX Apply 1 application. topically daily as needed (Pain).    [provider]  aspirin 81 MG chewable tablet Chew 1 tablet (81 mg total) by mouth 2 (two) times daily. 04/23/21   [name], PA-C  Calcium 600-200 MG-UNIT tablet Take 2 tablets by mouth daily.    [provider]  Cholecalciferol (VITAMIN D-3) 125 MCG (5000 UT) TABS Take 5,000 Units by mouth daily.    [provider]  famotidine (PEPCID) 40 MG tablet Take 40 mg by mouth at bedtime.    [provider]  ferrous sulfate 324 MG TBEC Take 324 mg by mouth daily with breakfast.    [provider]  levothyroxine (SYNTHROID) 100 MCG tablet Take 100 mcg by mouth daily. 05/01/19   [provider]  lidocaine (LIDODERM) 5 %  Place 1 patch onto the skin daily. Remove & Discard patch within 12 hours or as directed by MD Patient taking differently: Place 1 patch onto the skin daily as needed (pain). Remove & Discard patch within 12 hours or as directed by MD 08/01/19   [name], PA-C  methocarbamol (ROBAXIN) 500 MG tablet Take 1 tablet (500 mg total) by mouth every 6 (six) hours as needed for muscle spasms. 04/23/21   [name], PA-C  omeprazole (PRILOSEC) 20 MG capsule Take 1 capsule (20 mg total) by mouth daily. 08/08/20   [name], PA-C  OVER THE COUNTER MEDICATION Place 1 drop into both eyes daily as needed (Dry eye). Dry eye formula    [provider]  pravastatin (PRAVACHOL) 40 MG  tablet Take 40 mg by mouth daily.  12/28/16   [provider]  SUMAtriptan (IMITREX) 25 MG tablet Take 1 tab at onset of migraine.  May repeat in 2 hrs, if needed.  Max dose: 2 tabs/day. This is a 30 day prescription. 10/03/18   [name], MD    Family History Family History  Problem Relation Age of Onset   Hyperlipidemia Mother    Hypertension Mother    Alzheimer's disease Father    Hypertension Sister    Hypertension Sister    Hypertension Sister    Hypertension Brother    Lung cancer Brother    Hypertension Sister    Hypertension Brother     Social History Social History   Tobacco Use   Smoking status: Never   Smokeless tobacco: Never  Vaping Use   Vaping Use: Never used  Substance Use Topics   Alcohol use: Never   Drug use: Never     Allergies   Septra [sulfamethoxazole-trimethoprim] and Sulfa antibiotics   Review of Systems Review of Systems Per HPI  Physical Exam Triage Vital Signs ED Triage Vitals [07/05/21 1318]  Enc Vitals Group     BP 98/61     Pulse Rate 68     Resp 19     Temp 98.4 F (36.9 C)     Temp src      SpO2 97 %     Weight      Height      Head Circumference      Peak Flow      Pain Score 0     Pain Loc      Pain Edu?      Excl. in [place]?    No data found.  Updated Vital Signs BP 98/61 (BP Location: Right Arm)   Pulse 68   Temp 98.4 F (36.9 C)   Resp 19   SpO2 97%   Visual Acuity Right Eye Distance:   Left Eye Distance:   Bilateral Distance:    Right Eye Near:   Left Eye Near:    Bilateral Near:     Physical Exam Constitutional:      General: She is not in acute distress.    Appearance: Normal appearance. She is not toxic-appearing or diaphoretic.  HENT:     Head: Normocephalic and atraumatic.  Eyes:     Extraocular Movements: Extraocular movements intact.     Conjunctiva/sclera: Conjunctivae normal.  Pulmonary:     Effort: Pulmonary effort is normal.  Skin:    General: Skin is warm and dry.      Findings: Laceration present.     Comments: Very minimal curvolinear superficial laceration that is approximately 5 mm.  Entire depth  of wound visualized.  Bleeding controlled.  Patient has full range of motion of finger.  Nail is intact.  Laceration is present at the distal end at the pad of the finger.  Neurovascular intact.  Neurological:     General: No focal deficit present.     Mental Status: She is alert and oriented to person, place, and time. Mental status is at baseline.  Psychiatric:        Mood and Affect: Mood normal.        Behavior: Behavior normal.        Thought Content: Thought content normal.        Judgment: Judgment normal.     UC Treatments / Results  Labs (all labs ordered are listed, but only abnormal results are displayed) Labs Reviewed - No data to display  EKG   Radiology No results found.  Procedures Laceration Repair  Date/Time: 07/05/2021 1:49 PM Performed by: Teodora Medici, FNP Authorized by: Teodora Medici, FNP   Consent:    Consent obtained:  Verbal   Consent given by:  Patient   Risks, benefits, and alternatives were discussed: yes     Risks discussed:  Infection and pain   Alternatives discussed:  No treatment and delayed treatment Universal protocol:    Procedure explained and questions answered to patient or proxy's satisfaction: yes     Immediately prior to procedure, a time out was called: yes     Patient identity confirmed:  Verbally with patient and arm band Anesthesia:    Anesthesia method:  None Laceration details:    Location:  Finger   Finger location:  L index finger   Length (cm):  0.5 Exploration:    Imaging outcome: foreign body not noted     Wound exploration: wound explored through full range of motion and entire depth of wound visualized     Contaminated: no   Treatment:    Wound cleansed with: wound cleanser.   Amount of cleaning:  Standard Skin repair:    Repair method:  Tissue adhesive  (dermabond) Approximation:    Approximation:  Close Repair type:    Repair type:  Simple Post-procedure details:    Dressing:  Non-adherent dressing   Procedure completion:  Tolerated well, no immediate complications (including critical care time)  Medications Ordered in UC Medications  Tdap (BOOSTRIX) injection 0.5 mL (0.5 mLs Intramuscular Given 07/05/21 1334)    Initial Impression / Assessment and Plan / UC Course  I have reviewed the triage vital signs and the nursing notes.  Pertinent labs & imaging results that were available during my care of the patient were reviewed by me and considered in my medical decision making (see chart for details).     Wound was cleaned.  Tetanus vaccine updated.  No sutures needed.  Very minimal laceration noted and does not appear to require closure but Dermabond glue was used to approximate edges.  Patient is neurovascularly intact.  Patient was advised to monitor for infection and follow-up if this occurs.  Patient was given strict return precautions.  Patient verbalized understanding and was agreeable with plan. Final Clinical Impressions(s) / UC Diagnoses   Final diagnoses:  Laceration of left index finger without foreign body without damage to nail, initial encounter     Discharge Instructions      Monitor for signs of infection that include increased redness, swelling, pus.  Follow-up if this occurs.  Your tetanus vaccine has been updated today.    ED  Prescriptions   None    PDMP not reviewed this encounter.   Teodora Medici, [place] 07/05/21 1352

## 2021-07-05 NOTE — ED Triage Notes (Signed)
Pt reports cutting up chicken earlier and cut index finger with knife. Pt denies pain at this time. Band aid controlling bleeding at this time

## 2021-07-05 NOTE — Discharge Instructions (Signed)
Monitor for signs of infection that include increased redness, swelling, pus.  Follow-up if this occurs.  Your tetanus vaccine has been updated today.

## 2021-07-06 ENCOUNTER — Ambulatory Visit
Admission: EM | Admit: 2021-07-06 | Discharge: 2021-07-06 | Disposition: A | Discharge status: Home / Self Care | Payer: Medicare Other | Attending: Internal Medicine | Admitting: Internal Medicine

## 2021-07-06 DIAGNOSIS — S61211D Laceration without foreign body of left index finger without damage to nail, subsequent encounter: Secondary | ICD-10-CM

## 2021-07-06 NOTE — ED Provider Notes (Signed)
[facility]    CSN: [id number] Arrival date & time: 07/06/21  1207      History   Chief Complaint Chief Complaint  Patient presents with   Laceration    HPI Anna [name] is a 76 y.o. female.   Patient was seen yesterday for laceration to left index finger.  Glue was applied and patient was advised of supportive care and monitoring.  Patient presents today because she states that she thinks the glue fell off and it was bleeding for short time earlier today.  Denies any other changes to the wound.  Denies purulent drainage, increased welling, redness, numbness, tingling.  Patient still has full range of motion.   Laceration  Past Medical History:  Diagnosis Date   Arthritis    Breast cancer [facility])    age 49   Family history of adverse reaction to anesthesia    Son takes longer to wake   Headache    Hyperlipidemia    Osteopenia    Vision abnormalities     Patient Active Problem List   Diagnosis Date Noted   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[facility]) joint of left thumb 06/10/2021   Digital mucous cyst of finger 06/10/2021   Status post total replacement of right hip 04/22/2021   Mixed hyperlipidemia 02/06/2021   Musculoskeletal chest pain 02/06/2021   Obesity (BMI 30-39.9) 02/06/2021   Unilateral primary osteoarthritis, right hip 11/06/2020   Unilateral primary osteoarthritis, right knee 11/06/2020   Chest pain of uncertain etiology 78/29/5621   Cardiac murmur 08/29/2020   Mixed dyslipidemia 08/29/2020   Arthritis 08/16/2020   Breast cancer ([place]) 08/16/2020   Headache 08/16/2020   Osteopenia 08/16/2020   Vision abnormalities 08/16/2020   Chronic right shoulder pain 03/07/2018   Chronic migraine 08/11/2017    Past Surgical History:  Procedure Laterality Date   BACK SURGERY     lumbar region   BUNIONECTOMY Bilateral    MASTECTOMY Left    at age 32   [place] Right 04/22/2021   Procedure: Right TOTAL HIP ARTHROPLASTY ANTERIOR APPROACH;   Surgeon: [name], MD;  Location: [place];  Service: Orthopedics;  Laterality: Right;   TRIGGER FINGER RELEASE Left    left thumb   TUBAL LIGATION      OB History   No obstetric history on file.      Home Medications    Prior to Admission medications   Medication Sig Start Date End Date Taking? Authorizing Provider  Acetaminophen 500 MG capsule Take 500-1,000 mg by mouth daily. Take 1000 mg in the morning and 500 mg at bedtime    [provider]  ASPERCREME LIDOCAINE EX Apply 1 application. topically daily as needed (Pain).    [provider]  aspirin 81 MG chewable tablet Chew 1 tablet (81 mg total) by mouth 2 (two) times daily. 04/23/21   [name], PA-C  Calcium 600-200 MG-UNIT tablet Take 2 tablets by mouth daily.    [provider]  Cholecalciferol (VITAMIN D-3) 125 MCG (5000 UT) TABS Take 5,000 Units by mouth daily.    [provider]  famotidine (PEPCID) 40 MG tablet Take 40 mg by mouth at bedtime.    [provider]  ferrous sulfate 324 MG TBEC Take 324 mg by mouth daily with breakfast.    [provider]  levothyroxine (SYNTHROID) 100 MCG tablet Take 100 mcg by mouth daily. 05/01/19   [provider]  lidocaine (LIDODERM) 5 % Place 1 patch  onto the skin daily. Remove & Discard patch within 12 hours or as directed by MD Patient taking differently: Place 1 patch onto the skin daily as needed (pain). Remove & Discard patch within 12 hours or as directed by MD 08/01/19   [name], PA-C  methocarbamol (ROBAXIN) 500 MG tablet Take 1 tablet (500 mg total) by mouth every 6 (six) hours as needed for muscle spasms. 04/23/21   [name], PA-C  omeprazole (PRILOSEC) 20 MG capsule Take 1 capsule (20 mg total) by mouth daily. 08/08/20   [name], PA-C  OVER THE COUNTER MEDICATION Place 1 drop into both eyes daily as needed (Dry eye). Dry eye formula    [provider]  pravastatin (PRAVACHOL)  40 MG tablet Take 40 mg by mouth daily.  12/28/16   [provider]  SUMAtriptan (IMITREX) 25 MG tablet Take 1 tab at onset of migraine.  May repeat in 2 hrs, if needed.  Max dose: 2 tabs/day. This is a 30 day prescription. 10/03/18   [name], MD    Family History Family History  Problem Relation Age of Onset   Hyperlipidemia Mother    Hypertension Mother    Alzheimer's disease Father    Hypertension Sister    Hypertension Sister    Hypertension Sister    Hypertension Brother    Lung cancer Brother    Hypertension Sister    Hypertension Brother     Social History Social History   Tobacco Use   Smoking status: Never   Smokeless tobacco: Never  Vaping Use   Vaping Use: Never used  Substance Use Topics   Alcohol use: Never   Drug use: Never     Allergies   Septra [sulfamethoxazole-trimethoprim] and Sulfa antibiotics   Review of Systems Review of Systems Per HPI  Physical Exam Triage Vital Signs ED Triage Vitals [07/06/21 1244]  Enc Vitals Group     BP 109/62     Pulse Rate 71     Resp (!) 99     Temp 98 F (36.7 C)     Temp Source Oral     SpO2 98 %     Weight      Height      Head Circumference      Peak Flow      Pain Score 0     Pain Loc      Pain Edu?      Excl. in [place]?    No data found.  Updated Vital Signs BP 109/62 (BP Location: Right Arm)   Pulse 71   Temp 98 F (36.7 C) (Oral)   Resp 19   SpO2 98%   Visual Acuity Right Eye Distance:   Left Eye Distance:   Bilateral Distance:    Right Eye Near:   Left Eye Near:    Bilateral Near:     Physical Exam Constitutional:      General: She is not in acute distress.    Appearance: Normal appearance. She is not toxic-appearing or diaphoretic.  HENT:     Head: Normocephalic and atraumatic.  Eyes:     Extraocular Movements: Extraocular movements intact.     Conjunctiva/sclera: Conjunctivae normal.  Pulmonary:     Effort: Pulmonary effort is normal.  Skin:    Comments: No  changes to laceration from previous exam from yesterday's urgent care visit.  It appears that part of the glue has come off.  There is dried blood to part of  the laceration.  No active bleeding noted.  No signs of infection including increased erythema, swelling, pus.  Patient has full range of motion of finger.  Neurovascular intact.  Neurological:     General: No focal deficit present.     Mental Status: She is alert and oriented to person, place, and time. Mental status is at baseline.  Psychiatric:        Mood and Affect: Mood normal.        Behavior: Behavior normal.        Thought Content: Thought content normal.        Judgment: Judgment normal.     UC Treatments / Results  Labs (all labs ordered are listed, but only abnormal results are displayed) Labs Reviewed - No data to display  EKG   Radiology No results found.  Procedures Procedures (including critical care time)  Medications Ordered in UC Medications - No data to display  Initial Impression / Assessment and Plan / UC Course  I have reviewed the triage vital signs and the nursing notes.  Pertinent labs & imaging results that were available during my care of the patient were reviewed by me and considered in my medical decision making (see chart for details).     No obvious change to appearance of laceration from previous urgent care visit.  As discussed previously, closure of the laceration was not necessarily needed but glue was applied at previous urgent care visit.  It appears that part of the glue has fallen off.  The edges are still closely approximated and no concern for any further closure at this time.  No signs of infection present.  There is some dried blood but no active bleeding.  Patient was advised to continue to monitor and was advised of supportive care.  Nonadherent dressing with antibiotic ointment was applied and the patient was advised to keep covered until healed.  Strict return and ER precautions  were discussed with patient.  Patient verbalized understanding and was agreeable with plan. Final Clinical Impressions(s) / UC Diagnoses   Final diagnoses:  Laceration of left index finger without foreign body without damage to nail, subsequent encounter     Discharge Instructions      Keep covered until healed.  Monitor for signs of infection that include increased redness, swelling, pus.  Follow-up if symptoms persist or worsen.    ED Prescriptions   None    PDMP not reviewed this encounter.   Teodora Medici, [place] 07/06/21 [phone]

## 2021-07-06 NOTE — Discharge Instructions (Signed)
Keep covered until healed.  Monitor for signs of infection that include increased redness, swelling, pus.  Follow-up if symptoms persist or worsen.

## 2021-07-06 NOTE — ED Triage Notes (Signed)
Patient presents to Urgent Care with complaints of laceration that was glued yesterday since this morning she noticed it was bleeding. Patient reports she is concerned that it was bleeding and the glu didn't hold.

## 2021-07-28 DIAGNOSIS — K219 Gastro-esophageal reflux disease without esophagitis: Secondary | ICD-10-CM | POA: Diagnosis not present

## 2021-07-29 ENCOUNTER — Telehealth: Payer: Self-pay | Admitting: *Deleted

## 2021-07-29 NOTE — Telephone Encounter (Signed)
90 day Ortho bundle call completed. 

## 2021-08-08 ENCOUNTER — Ambulatory Visit: Payer: Medicare Other | Admitting: Cardiology

## 2021-08-08 ENCOUNTER — Encounter: Payer: Self-pay | Admitting: Cardiology

## 2021-08-08 VITALS — BP 100/60 | HR 71 | Ht 66.0 in | Wt 193.0 lb

## 2021-08-08 DIAGNOSIS — R079 Chest pain, unspecified: Secondary | ICD-10-CM | POA: Diagnosis not present

## 2021-08-08 LAB — BASIC METABOLIC PANEL
BUN/Creatinine Ratio: 20 (ref 12–28)
BUN: 18 mg/dL (ref 8–27)
CO2: 22 mmol/L (ref 20–29)
Calcium: 9.3 mg/dL (ref 8.7–10.3)
Chloride: 106 mmol/L (ref 96–106)
Creatinine, Ser: 0.91 mg/dL (ref 0.57–1.00)
Glucose: 102 mg/dL — ABNORMAL HIGH (ref 70–99)
Potassium: 3.7 mmol/L (ref 3.5–5.2)
Sodium: 143 mmol/L (ref 134–144)
eGFR: 67 mL/min/{1.73_m2} (ref >59)

## 2021-08-08 LAB — MAGNESIUM: Magnesium: 2 mg/dL (ref 1.6–2.3)

## 2021-08-08 MED ORDER — IVABRADINE HCL 5 MG PO TABS: ORAL_TABLET | ORAL | 0 refills | Status: DC

## 2021-08-08 NOTE — Progress Notes (Signed)
Cardiology Office Note:    Date:  08/08/2021   ID:  Anna [name], DOB Nov 04, 1945, MRN [id number]  PCP:  [name], MD  Cardiologist:  Berniece Salines, DO  Electrophysiologist:  None   Referring MD: [name], MD   I am experiencing chest pain  History of Present Illness:    Anna [name] is a 76 y.o. female with a hx of hyperlipidemia, obesity here today for follow-up visit.  I first saw the patient in January 2023 at that time she requested to provide this switch so this was all first visit.  During that visit she had recently had a nuclear stress test which was normal and echocardiogram was also normal.  During that visit she was not experiencing any chest discomfort that sound like angina.  However today the patient tells me that she has been experiencing intermittent midsternal chest discomfort.  She notes that it started in her mid chest and will radiate diffusely and sometimes go around the back.  She is concerned.  It comes off and off.  Nothing makes it better or worse.  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 Take 1000 mg in the morning and 500 mg at bedtime   ASPERCREME LIDOCAINE EX Apply 1 application. topically daily as needed (Pain).   Calcium  600-200 MG-UNIT tablet Take 2 tablets by mouth daily.   Cholecalciferol (VITAMIN D-3) 125 MCG (5000 UT) TABS Take 5,000 Units by mouth daily.   famotidine (PEPCID) 40 MG tablet Take 40 mg by mouth at bedtime.   ferrous sulfate 324 MG TBEC Take 324 mg by mouth daily with breakfast.   ivabradine (CORLANOR) 5 MG TABS tablet Take 10 mg (2 tablets) 2 hours before CT scan   levothyroxine (SYNTHROID) 100 MCG tablet Take 100 mcg by mouth daily.   lidocaine (LIDODERM) 5 % Place 1 patch onto the skin daily. Remove & Discard patch within 12 hours or as directed by MD   omeprazole (PRILOSEC) 20 MG capsule Take 1 capsule (20 mg total) by mouth daily.   OVER THE COUNTER MEDICATION Place 1 drop into both eyes daily as needed (Dry eye). Dry eye formula   pravastatin (PRAVACHOL) 40 MG tablet Take 40 mg by mouth daily.    SUMAtriptan (IMITREX) 25 MG tablet Take 1 tab at onset of migraine.  May repeat in 2 hrs, if needed.  Max dose: 2 tabs/day. This is a 30 day prescription.     Allergies:   Septra [sulfamethoxazole-trimethoprim] and Sulfa antibiotics   Social History   Socioeconomic History   Marital status: Widowed    Spouse  name: Not on file   Number of children: 2   Years of education: Not on file   Highest education level: Not on file  Occupational History   Not on file  Tobacco Use   Smoking status: Never   Smokeless tobacco: Never  Vaping Use   Vaping Use: Never used  Substance and Sexual Activity   Alcohol use: Never   Drug use: Never   Sexual activity: Not Currently  Other Topics Concern   Not on file  Social History Narrative   Not on file   Social Determinants of Health   Financial Resource Strain: Not on file  Food Insecurity: Not on file  Transportation Needs: Not on file  Physical Activity: Not on file  Stress: Not on file  Social Connections: Not on file     Family History: The patient's family history includes Alzheimer's disease in her father; Hyperlipidemia in her  mother; Hypertension in her brother, brother, mother, sister, sister, sister, and sister; Lung cancer in her brother.  ROS:   Review of Systems  Constitution: Negative for decreased appetite, fever and weight gain.  HENT: Negative for congestion, ear discharge, hoarse voice and sore throat.   Eyes: Negative for discharge, redness, vision loss in right eye and visual halos.  Cardiovascular: Negative for chest pain, dyspnea on exertion, leg swelling, orthopnea and palpitations.  Respiratory: Negative for cough, hemoptysis, shortness of breath and snoring.   Endocrine: Negative for heat intolerance and polyphagia.  Hematologic/Lymphatic: Negative for bleeding problem. Does not bruise/bleed easily.  Skin: Negative for flushing, nail changes, rash and suspicious lesions.  Musculoskeletal: Negative for arthritis, joint pain, muscle cramps, myalgias, neck pain and stiffness.  Gastrointestinal: Negative for abdominal pain, bowel incontinence, diarrhea and excessive appetite.  Genitourinary: Negative for decreased libido, genital sores and incomplete emptying.  Neurological: Negative for brief paralysis, focal weakness, headaches and loss of balance.  Psychiatric/Behavioral: Negative for altered mental status, depression and suicidal ideas.  Allergic/Immunologic: Negative for HIV exposure and persistent infections.    EKGs/Labs/Other Studies Reviewed:    The following studies were reviewed today:   EKG: None today  Transthoracic echocardiogram August 2022 IMPRESSIONS   1. Left ventricular ejection fraction, by estimation, is 60 to 65%. The left ventricle has normal function. The left ventricle has no regional wall motion abnormalities. There is mild left ventricular hypertrophy.  Left ventricular diastolic parameters  are consistent with Grade I diastolic dysfunction (impaired relaxation).   2. Right ventricular systolic function is normal. The right ventricular  size is normal. There is  normal pulmonary artery systolic pressure. The  estimated right ventricular systolic pressure is 62.7 mmHg.   3. Left atrial size was mildly dilated.   4. The mitral valve is normal in structure. Trivial mitral valve  regurgitation. No evidence of mitral stenosis.   5. The aortic valve is tricuspid. Aortic valve regurgitation is not  visualized. No aortic stenosis is present.   6. The inferior vena cava is normal in size with greater than 50%  respiratory variability, suggesting right atrial pressure of 3 mmHg.   FINDINGS   Left Ventricle: Left ventricular ejection fraction, by estimation, is 60  to 65%. The left ventricle has normal function. The left ventricle has no  regional wall motion abnormalities. The left ventricular internal cavity  size was normal in size. There is   mild left ventricular hypertrophy. Left ventricular diastolic parameters  are consistent with Grade I diastolic dysfunction (impaired relaxation).   Right Ventricle: The right  ventricular size is normal. No increase in  right ventricular wall thickness. Right ventricular systolic function is  normal. There is normal pulmonary artery systolic pressure. The tricuspid  regurgitant velocity is 2.59 m/s, and   with an assumed right atrial pressure of 3 mmHg, the estimated right  ventricular systolic pressure is 03.4 mmHg.   Left Atrium: Left atrial size was mildly dilated.   Right Atrium: Right atrial size was normal in size.   Pericardium: Trivial pericardial effusion is present.   Mitral Valve: The mitral valve is normal in structure. Trivial mitral  valve regurgitation. No evidence of mitral valve stenosis.   Tricuspid Valve: The tricuspid valve is normal in structure. Tricuspid  valve regurgitation is mild . No evidence of tricuspid stenosis.   Aortic Valve: The aortic valve is tricuspid. Aortic valve regurgitation is  not visualized. No aortic stenosis is present.   Pulmonic Valve: The pulmonic valve  was normal in structure. Pulmonic valve  regurgitation is trivial. No evidence of pulmonic stenosis.   Aorta: The aortic root is normal in size and structure.   Venous: The inferior vena cava is normal in size with greater than 50%  respiratory variability, suggesting right atrial pressure of 3 mmHg.   Nuclear stress test Nuclear stress EF: 57%. The left ventricular ejection fraction is normal (55-65%). This is a low risk study. There is no evidence of ischemia and no evidence of previous infarction. The study is normal.    Recent Labs: 11/28/2020: ALT 41 04/23/2021: BUN 16; Creatinine, Ser 0.87; Potassium 3.7; Sodium 136 04/24/2021: Hemoglobin 9.1; Platelets 170  Recent Lipid Panel No results found for: "CHOL", "TRIG", "HDL", "CHOLHDL", "VLDL", "LDLCALC", "LDLDIRECT"  Physical Exam:    VS:  BP 100/60 (BP Location: Left Arm)   Pulse 71   Ht '5\' 6"'$  (1.676 m)   Wt 193 lb (87.5 kg)   SpO2 98%   BMI 31.15 kg/m     Wt Readings from Last 3 Encounters:  08/08/21 193 lb (87.5 kg)  04/22/21 196 lb (88.9 kg)  04/18/21 196 lb 12.8 oz (89.3 kg)     GEN: Well nourished, well developed in no acute distress HEENT: Normal NECK: No JVD; No carotid bruits LYMPHATICS: No lymphadenopathy CARDIAC: S1S2 noted,RRR, no murmurs, rubs, gallops RESPIRATORY:  Clear to auscultation without rales, wheezing or rhonchi  ABDOMEN: Soft, non-tender, non-distended, +bowel sounds, no guarding. EXTREMITIES: No edema, No cyanosis, no clubbing MUSCULOSKELETAL:  No deformity  SKIN: Warm and dry NEUROLOGIC:  Alert and oriented x 3, non-focal PSYCHIATRIC:  Normal affect, good insight  ASSESSMENT:    1. Chest pain of uncertain etiology   2. Chest pain, unspecified type    PLAN:    The symptoms chest pain is concerning, this patient does have intermediate risk for coronary artery disease and at this time I would like to pursue an ischemic evaluation in this patient.  Shared decision a coronary CTA at this  time is appropriate.  I have discussed with the patient about the testing.  The patient has no IV contrast allergy and is agreeable to proceed with this test.  The patient is in agreement with the above plan. The patient left the office in stable condition.  The patient will follow up in 6 months    Medication Adjustments/Labs and Tests Ordered: Current medicines are reviewed at length with the patient today.  Concerns regarding medicines are outlined above.  Orders Placed This Encounter  Procedures   CT CORONARY MORPH W/CTA COR W/SCORE  W/CA W/CM &/OR WO/CM   Basic Metabolic Panel (BMET)   Magnesium   Meds ordered this encounter  Medications   ivabradine (CORLANOR) 5 MG TABS tablet    Sig: Take 10 mg (2 tablets) 2 hours before CT scan    Dispense:  2 tablet    Refill:  0    Patient Instructions  Medication Instructions:  Your physician recommends that you continue on your current medications as directed. Please refer to the Current Medication list given to you today.  *If you need a refill on your cardiac medications before your next appointment, please call your pharmacy*   Lab Work: Your physician recommends that you return for lab work in:  TODAY: BMET, [place] If you have labs (blood work) drawn today and your tests are completely normal, you will receive your results only by: MyChart Message (if you have [place]) OR A paper copy in the mail If you have any lab test that is abnormal or we need to change your treatment, we will call you to review the results.   Testing/Procedures:   Your cardiac CT will be scheduled at one of the below locations:   [facility] [address] [place], [place] [address] [phone]   If scheduled at [facility], please arrive at the [facility] and Children's Entrance (Entrance C2) of [facility] 30 minutes prior to test start time. You can use the FREE valet parking offered at entrance C (encouraged to control  the heart rate for the test)  Proceed to the [facility] Radiology Department (first floor) to check-in and test prep.  All radiology patients and guests should use entrance C2 at [facility], accessed from [facility], even though the hospital's physical address listed is [address]    Please follow these instructions carefully (unless otherwise directed):   On the Night Before the Test: Be sure to Drink plenty of water. Do not consume any caffeinated/decaffeinated beverages or chocolate 12 hours prior to your test. Do not take any antihistamines 12 hours prior to your test.  On the Day of the Test: Drink plenty of water until 1 hour prior to the test. Do not eat any food 4 hours prior to the test. You may take your regular medications prior to the test.  Take Ivabradine (Corlanor) 10 mg (2 tablets) two hours prior to test. FEMALES- please wear underwire-free bra if available, avoid dresses & tight clothing      After the Test: Drink plenty of water. After receiving IV contrast, you may experience a mild flushed feeling. This is normal. On occasion, you may experience a mild rash up to 24 hours after the test. This is not dangerous. If this occurs, you can take Benadryl 25 mg and increase your fluid intake. If you experience trouble breathing, this can be serious. If it is severe call 911 IMMEDIATELY. If it is mild, please call our office. If you take any of these medications: Glipizide/Metformin, Avandament, Glucavance, please do not take 48 hours after completing test unless otherwise instructed.  We will call to schedule your test 2-4 weeks out understanding that some insurance companies will need an authorization prior to the service being performed.   For non-scheduling related questions, please contact the cardiac imaging nurse navigator should you have any questions/concerns: Marchia Bond, Cardiac Imaging Nurse Navigator Gordy Clement, Cardiac  Imaging Nurse Navigator [facility] Direct Office Dial: [phone]   For scheduling  needs, including cancellations and rescheduling, please call [place], [phone].    Follow-Up: At [facility], you and your health needs are our priority.  As part of our continuing mission to provide you with exceptional heart care, we have created designated Provider Care Teams.  These Care Teams include your primary Cardiologist (physician) and Advanced Practice Providers (APPs -  Physician Assistants and Nurse Practitioners) who all work together to provide you with the care you need, when you need it.  We recommend signing up for the patient portal called "MyChart".  Sign up information is provided on this After Visit Summary.  MyChart is used to connect with patients for Virtual Visits (Telemedicine).  Patients are able to view lab/test results, encounter notes, upcoming appointments, etc.  Non-urgent messages can be sent to your provider as well.   To learn more about what you can do with MyChart, go to [email or web address].    Your next appointment:   9 month(s)  The format for your next appointment:   In Person  Provider:   Berniece Salines, DO     Other Instructions   Important Information About Sugar         Adopting a Healthy Lifestyle.  Know what a healthy weight is for you (roughly BMI <25) and aim to maintain this   Aim for 7+ servings of fruits and vegetables daily   65-80+ fluid ounces of water or unsweet tea for healthy kidneys   Limit to max 1 drink of alcohol per day; avoid smoking/tobacco   Limit animal fats in diet for cholesterol and heart health - choose grass fed whenever available   Avoid highly processed foods, and foods high in saturated/trans fats   Aim for low stress - take time to unwind and care for your mental health   Aim for 150 min of moderate intensity exercise weekly for heart health, and weights twice weekly for  bone health   Aim for 7-9 hours of sleep daily   When it comes to diets, agreement about the perfect plan isnt easy to find, even among the experts. Experts at the [employer] developed an idea known as the Healthy Eating Plate. Just imagine a plate divided into logical, healthy portions.   The emphasis is on diet quality:   Load up on vegetables and fruits - one-half of your plate: Aim for color and variety, and remember that potatoes dont count.   Go for whole grains - one-quarter of your plate: Whole wheat, barley, wheat berries, quinoa, oats, brown rice, and foods made with them. If you want pasta, go with whole wheat pasta.   Protein power - one-quarter of your plate: Fish, chicken, beans, and nuts are all healthy, versatile protein sources. Limit red meat.   The diet, however, does go beyond the plate, offering a few other suggestions.   Use healthy plant oils, such as olive, canola, soy, corn, sunflower and peanut. Check the labels, and avoid partially hydrogenated oil, which have unhealthy trans fats.   If youre thirsty, drink water. Coffee and tea are good in moderation, but skip sugary drinks and limit milk and dairy products to one or two daily servings.   The type of carbohydrate in the diet is more important than the amount. Some sources of carbohydrates, such as vegetables, fruits, whole grains, and beans-are healthier than others.   Finally, stay active  Signed, Berniece Salines, DO  08/08/2021 11:13 AM    [place]

## 2021-08-08 NOTE — Patient Instructions (Addendum)
Medication Instructions:  Your physician recommends that you continue on your current medications as directed. Please refer to the Current Medication list given to you today.  *If you need a refill on your cardiac medications before your next appointment, please call your pharmacy*   Lab Work: Your physician recommends that you return for lab work in:  TODAY: BMET, [place] If you have labs (blood work) drawn today and your tests are completely normal, you will receive your results only by: MyChart Message (if you have [place]) OR A paper copy in the mail If you have any lab test that is abnormal or we need to change your treatment, we will call you to review the results.   Testing/Procedures:   Your cardiac CT will be scheduled at one of the below locations:   [facility] [address] [place], [place] [address] [phone]   If scheduled at [facility], please arrive at the [facility] and Children's Entrance (Entrance C2) of [facility] 30 minutes prior to test start time. You can use the FREE valet parking offered at entrance C (encouraged to control the heart rate for the test)  Proceed to the [facility] Radiology Department (first floor) to check-in and test prep.  All radiology patients and guests should use entrance C2 at [facility], accessed from [facility], even though the hospital's physical address listed is [address]    Please follow these instructions carefully (unless otherwise directed):   On the Night Before the Test: Be sure to Drink plenty of water. Do not consume any caffeinated/decaffeinated beverages or chocolate 12 hours prior to your test. Do not take any antihistamines 12 hours prior to your test.  On the Day of the Test: Drink plenty of water until 1 hour prior to the test. Do not eat any food 4 hours prior to the test. You may take your regular medications prior to the test.  Take  Ivabradine (Corlanor) 10 mg (2 tablets) two hours prior to test. FEMALES- please wear underwire-free bra if available, avoid dresses & tight clothing      After the Test: Drink plenty of water. After receiving IV contrast, you may experience a mild flushed feeling. This is normal. On occasion, you may experience a mild rash up to 24 hours after the test. This is not dangerous. If this occurs, you can take Benadryl 25 mg and increase your fluid intake. If you experience trouble breathing, this can be serious. If it is severe call 911 IMMEDIATELY. If it is mild, please call our office. If you take any of these medications: Glipizide/Metformin, Avandament, Glucavance, please do not take 48 hours after completing test unless otherwise instructed.  We will call to schedule your test 2-4 weeks out understanding that some insurance companies will need an authorization prior to the service being performed.   For non-scheduling related questions, please contact the cardiac imaging nurse navigator should you have any questions/concerns: Marchia Bond, Cardiac Imaging Nurse Navigator Gordy Clement, Cardiac Imaging Nurse Navigator [facility] Direct Office Dial: [phone]   For scheduling needs, including cancellations and rescheduling, please call [place], [phone].    Follow-Up: At [facility], you and your health needs are our priority.  As part of our continuing mission to provide you with exceptional heart care, we have created designated Provider Care Teams.  These Care Teams include your primary Cardiologist (physician) and Advanced Practice Providers (APPs -  Physician Assistants  and Nurse Practitioners) who all work together to provide you with the care you need, when you need it.  We recommend signing up for the patient portal called "MyChart".  Sign up information is provided on this After Visit Summary.  MyChart is used to connect with patients for  Virtual Visits (Telemedicine).  Patients are able to view lab/test results, encounter notes, upcoming appointments, etc.  Non-urgent messages can be sent to your provider as well.   To learn more about what you can do with MyChart, go to [email or web address].    Your next appointment:   9 month(s)  The format for your next appointment:   In Person  Provider:   Berniece Salines, DO     Other Instructions   Important Information About Sugar

## 2021-08-20 ENCOUNTER — Telehealth: Payer: Self-pay | Admitting: Cardiology

## 2021-08-20 NOTE — Telephone Encounter (Signed)
Pt c/o medication issue:  1. Name of Medication:  ivabradine (CORLANOR) 5 MG TABS tablet  2. How are you currently taking this medication (dosage and times per day)?   3. Are you having a reaction (difficulty breathing--STAT)?   4. What is your medication issue?   Patient is to take this medication 2 hours prior to CT, but she states her insurance is not covering it due to not seeing it as a necessity. She would like to know how to proceed. Are we able to contact her insurance to verify this is needed? Please advise.

## 2021-08-20 NOTE — Telephone Encounter (Signed)
Spoke to patient, samples provided and placed at front desk for pick up.  Patient aware.

## 2021-08-29 ENCOUNTER — Telehealth (HOSPITAL_COMMUNITY): Payer: Self-pay | Admitting: *Deleted

## 2021-08-29 NOTE — Telephone Encounter (Signed)
Reaching out to patient to offer assistance regarding upcoming cardiac imaging study; pt verbalizes understanding of appt date/time, parking situation and where to check in, pre-test NPO status and medications ordered, and verified current allergies; name and call back number provided for further questions should they arise  Gordy Clement RN Navigator Cardiac Imaging [facility] [phone] office [phone] cell  Patient to take '10mg'$  ivabradine two hours prior to her cardiac CT scan. She is aware to arrive at 9:30am.

## 2021-09-01 ENCOUNTER — Ambulatory Visit (HOSPITAL_COMMUNITY)
Admission: RE | Admit: 2021-09-01 | Discharge: 2021-09-01 | Disposition: A | Discharge status: Home / Self Care | Payer: Medicare Other | Source: Ambulatory Visit | Attending: Cardiology | Admitting: Cardiology

## 2021-09-01 DIAGNOSIS — R079 Chest pain, unspecified: Secondary | ICD-10-CM | POA: Diagnosis not present

## 2021-09-01 DIAGNOSIS — I2510 Atherosclerotic heart disease of native coronary artery without angina pectoris: Secondary | ICD-10-CM | POA: Insufficient documentation

## 2021-09-01 MED ORDER — NITROGLYCERIN 0.4 MG SL SUBL
0.8000 mg | SUBLINGUAL_TABLET | Freq: Once | SUBLINGUAL | Status: AC
  Administered 2021-09-01: 0.8 mg via SUBLINGUAL

## 2021-09-01 MED ORDER — NITROGLYCERIN 0.4 MG SL SUBL
SUBLINGUAL_TABLET | SUBLINGUAL | Status: AC
  Filled 2021-09-01: qty 2

## 2021-09-01 MED ORDER — IOHEXOL 350 MG/ML SOLN
100.0000 mL | Freq: Once | INTRAVENOUS | Status: AC | PRN
  Administered 2021-09-01: 100 mL via INTRAVENOUS

## 2021-09-04 ENCOUNTER — Other Ambulatory Visit: Payer: Self-pay

## 2021-09-04 MED ORDER — ROSUVASTATIN CALCIUM 5 MG PO TABS: 5.0000 mg | ORAL_TABLET | Freq: Every day | ORAL | 3 refills | Status: DC

## 2021-09-04 MED ORDER — ASPIRIN 81 MG PO TBEC: 81.0000 mg | DELAYED_RELEASE_TABLET | Freq: Every day | ORAL | 3 refills | Status: AC

## 2021-09-05 ENCOUNTER — Telehealth: Payer: Self-pay | Admitting: Cardiology

## 2021-09-05 NOTE — Telephone Encounter (Signed)
Patient called in, wanted to speak with [facility] RN, however I advised they were in clinic. Patient had questions about the medications. We did discuss the need to switch, starting Aspirin- advised Rosuvastatin medication was sent to pharmacy. She will pick this up today. She did question about diet information. I sent to her the information via mychart.  Patient verbalized understanding, all questions answered.   Will route to RN to make aware.  Thanks!

## 2021-09-05 NOTE — Telephone Encounter (Signed)
  Pt requesting to speak with RM Jasmin, she said, she saw her mychart message and she has some questions

## 2021-09-18 ENCOUNTER — Telehealth: Payer: Self-pay

## 2021-09-18 ENCOUNTER — Telehealth: Payer: Self-pay | Admitting: Orthopaedic Surgery

## 2021-09-18 NOTE — Telephone Encounter (Signed)
Pt called in for advise about indigestion. Pt advised she should reach out to her GI provider about this matter. Pt made aware I will forward the CT results to the GI provider. Pt requested results be sent to PCP as well. Faxed results to both providers via Epic.

## 2021-09-18 NOTE — Telephone Encounter (Signed)
Pt called requesting a call back from Dr Ninfa Linden nurse. She need medical advice about shoulder pain what can she do. Please call pt at [phone].

## 2021-09-18 NOTE — Telephone Encounter (Signed)
Pt needs appt. We havent seen her for shoulder recently

## 2021-09-22 ENCOUNTER — Ambulatory Visit: Payer: Medicare Other | Admitting: Orthopaedic Surgery

## 2021-09-24 DIAGNOSIS — R079 Chest pain, unspecified: Secondary | ICD-10-CM | POA: Diagnosis not present

## 2021-09-24 DIAGNOSIS — K219 Gastro-esophageal reflux disease without esophagitis: Secondary | ICD-10-CM | POA: Diagnosis not present

## 2021-10-13 ENCOUNTER — Ambulatory Visit (INDEPENDENT_AMBULATORY_CARE_PROVIDER_SITE_OTHER): Payer: Medicare Other | Admitting: Orthopaedic Surgery

## 2021-10-13 ENCOUNTER — Ambulatory Visit: Payer: Self-pay

## 2021-10-13 DIAGNOSIS — Z96641 Presence of right artificial hip joint: Secondary | ICD-10-CM

## 2021-10-13 MED ORDER — NABUMETONE 500 MG PO TABS: 500.0000 mg | ORAL_TABLET | Freq: Two times a day (BID) | ORAL | 1 refills | Status: AC | PRN

## 2021-10-13 MED ORDER — TIZANIDINE HCL 4 MG PO TABS: 4.0000 mg | ORAL_TABLET | Freq: Three times a day (TID) | ORAL | 0 refills | Status: DC | PRN

## 2021-10-13 NOTE — Progress Notes (Signed)
The patient comes in today with left-sided neck pain and left shoulder pain.  We last injected the left shoulder back in January.  She is 76 years old and we replaced her right hip in March of this year.  She says she still has some numbness something with the hip but she is getting around without assistive device and seems be doing well.  She does have trouble sleeping at night with left-sided neck pain.  She has been to outpatient physical therapy for this before.  She denies any radicular symptoms.  On my exam today she does have left-sided neck pain that radiates into the trapezius area.  Her left shoulder moves smoothly and fluidly with no pain in the shoulder today.  There is no radicular symptoms or weakness distally in her left upper extremity.  I would like to send her to outpatient physical therapy to work on any modalities that can decrease her left-sided neck pain and left shoulder pain.  We will then see her back in 4 weeks to see how she is doing overall.  I will send in some Relafen as an anti-inflammatory and Zanaflex as a muscle relaxant.  In 4 weeks from now if she still having issues we would need to obtain an AP and lateral of her cervical spine and 3 views of her left shoulder.

## 2021-10-14 ENCOUNTER — Telehealth: Payer: Self-pay | Admitting: Orthopaedic Surgery

## 2021-10-14 ENCOUNTER — Other Ambulatory Visit: Payer: Self-pay

## 2021-10-14 DIAGNOSIS — G8929 Other chronic pain: Secondary | ICD-10-CM

## 2021-10-14 NOTE — Telephone Encounter (Signed)
Patient asking if she can take the nabumetone with her crestor and baby aspirin, I told her that was fine

## 2021-10-14 NOTE — Telephone Encounter (Signed)
Pt has questions about her medications. Please call pt at [phone].

## 2021-10-16 NOTE — Therapy (Unsigned)
OUTPATIENT PHYSICAL THERAPY CERVICAL EVALUATION   Patient Name: Anna [name] MRN: [id number] DOB:[birth year], 76 y.o., female Today's Date: 10/21/2021   PT End of Session - 10/21/21 0841     Visit Number 1    Number of Visits 16    Date for PT Re-Evaluation 12/19/21    Authorization Type UHC/ Medicare    PT Start Time 0835    PT Stop Time 0915    PT Time Calculation (min) 40 min    Activity Tolerance Patient tolerated treatment well    Behavior During Therapy WFL for tasks assessed/performed             Past Medical History:  Diagnosis Date   Arthritis    Breast cancer [facility])    age 35   Family history of adverse reaction to anesthesia    Son takes longer to wake   Headache    Hyperlipidemia    Osteopenia    Vision abnormalities    Past Surgical History:  Procedure Laterality Date   BACK SURGERY     lumbar region   BUNIONECTOMY Bilateral    MASTECTOMY Left    at age 26   TOTAL HIP ARTHROPLASTY Right 04/22/2021   Procedure: Right TOTAL HIP ARTHROPLASTY ANTERIOR APPROACH;  Surgeon: [name], MD;  Location: [place];  Service: Orthopedics;  Laterality: Right;   TRIGGER FINGER RELEASE Left    left thumb   TUBAL LIGATION     Patient Active Problem List   Diagnosis Date Noted   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[facility]) joint of left thumb 06/10/2021   Digital mucous cyst of finger 06/10/2021   Status post total replacement of right hip 04/22/2021   Mixed hyperlipidemia 02/06/2021   Musculoskeletal chest pain 02/06/2021   Obesity (BMI 30-39.9) 02/06/2021   Unilateral primary osteoarthritis, right hip 11/06/2020   Unilateral primary osteoarthritis, right knee 11/06/2020   Chest pain of uncertain etiology 57/32/2025   Cardiac murmur 08/29/2020   Mixed dyslipidemia 08/29/2020   Arthritis 08/16/2020   Breast cancer ([place]) 08/16/2020   Headache 08/16/2020   Osteopenia 08/16/2020   Vision abnormalities 08/16/2020   Chronic right shoulder pain 03/07/2018    Chronic migraine 08/11/2017    PCP: [name], MD  REFERRING PROVIDER: [name], MD   REFERRING DIAG: Chronic left shoulder pain [M25.512, G89.29]  THERAPY DIAG:  Chronic left shoulder pain  Cervicalgia  Muscle weakness (generalized)  Chronic right shoulder pain  Rationale for Evaluation and Treatment Rehabilitation  ONSET DATE: Pt stating she has been hurting since her surgery in 2018  SUBJECTIVE:  SUBJECTIVE STATEMENT: Pt arriving today stating she has been having some shoulder pain which she blieves is coming from her neck. Pt stating she had shoulder surgery in 2018 and she has had episodes of pain since. Pt stating she has been having neck spasms recently and trouble sleeping.    PERTINENT HISTORY:  Arthritis, OA, THA  PAIN:  Are you having pain? Yes: NPRS scale: 1/10 Pain location: neck. Left shoulder Pain description: achy Aggravating factors: sleeping, spasms, moving certain positions Relieving factors: exercises, over the counter pain meds, heat/ice, new meds Dr. Ninfa Linden perscribed  PRECAUTIONS: None  WEIGHT BEARING RESTRICTIONS No  FALLS:  Has patient fallen in last 6 months? No  LIVING ENVIRONMENT: Lives with: lives alone Lives in: House/apartment Stairs: No Has following equipment at home: None  OCCUPATION: retired  PLOF: Independent  PATIENT GOALS: "I want the pain to disappear"  OBJECTIVE:   DIAGNOSTIC FINDINGS:  None   PATIENT SURVEYS:  10/21/21: FOTO 68% (predicted 68%)   COGNITION: Overall cognitive status: Within functional limits for tasks assessed   SENSATION: WFL  POSTURE: rounded shoulders, forward head, and increased lumbar lordosis  PALPATION: 10/21/21: TTP, bilateral upper traps and medial scapular  border with tightness noted in mid trap and rhomboids   CERVICAL ROM:   A: active ROM P= passive ROM A/PROM (deg) 10/21/21 sitting  Flexion 20  Extension 10  Right lateral flexion 12  Left lateral flexion 14  Right rotation 30  Left rotation 28   (Blank rows = not tested)  UPPER EXTREMITY ROM:  A=active ROM P= passive Right 10/21/21 supine Left 10/21/21 supine  Shoulder flexion 150 135  Shoulder extension    Shoulder abduction 154 146  Shoulder adduction    Shoulder extension    Shoulder internal rotation 80  shoulder abd 45 deg 68 Shoulder abd 45 deg   Shoulder external rotation 65 Shoulder abd 45 deg 70 Shoulder ab 45 deg  Elbow flexion 140   Elbow extension 0    (Blank rows = not tested)  UPPER EXTREMITY MMT:  MMT Right eval Left eval  Shoulder flexion 5/5 4+/5  Shoulder extension 5/5 4+/5  Shoulder abduction    Shoulder adduction    Shoulder extension    Shoulder internal rotation 5/5 4+/5  Shoulder external rotation 5/5 4+/5  Middle trapezius    Lower trapezius     (Blank rows = not tested)    FUNCTIONAL TESTS:  10/21/21: 5 times sit to stand: 17 seconds UE support   TODAY'S TREATMENT:  10/21/21:  TherEx:  HEP established and reviewed see below exercises, trial of each performed   PATIENT EDUCATION:  Education details: FOTO, diagnosis, prognosis, HEP, TPDN, and PT POC Person educated: Patient Education method: Explanation and Demonstration, handout issued about TPDN for future session Education comprehension: verbalized understanding and returned demonstration   HOME EXERCISE PROGRAM: Access Code: [email or web address] URL: [email or web address] Date: 10/21/2021 Prepared by: Kearney Hard  Exercises - Supine Shoulder External Rotation in 45 Degrees Abduction AAROM with Dowel  - 2 x daily - 7 x weekly - 15 reps - 3 seconds hold - Supine Shoulder Flexion Extension AAROM with Dowel  - 2-3 x daily - 7 x weekly - 15 reps - 3 seconds  hold - Standing Shoulder Row with Anchored Resistance  - 2 x daily - 7 x weekly - 2 sets - 15 reps - 3 seconds hold - Supine Cervical Retraction with Towel  - 2 x daily - 7 x weekly - 10 reps -  5 seconds hold - Seated Upper Trap Stretch  - 2 x daily - 7 x weekly - 5 reps - 10 seconds hold - Seated Assisted Cervical Rotation with Towel  - 2 x daily - 7 x weekly - 5 reps - 10 seconds hold  ASSESSMENT:  CLINICAL IMPRESSION:  Patient is a 76 y.o. who comes to clinic with complaints of cervical pain and bilateral shoulder pain with mobility, strength and movement coordination deficits that impair their ability to perform usual daily and recreational functional activities without increase difficulty/symptoms.  Patient to benefit from skilled PT services to address impairments and limitations to improve to previous level of function without restriction secondary to condition.   OBJECTIVE IMPAIRMENTS: decreased activity tolerance, decreased shoulder mobility, decreased ROM, decreased strength, impaired flexibility, impaired UE use, postural dysfunction, and pain.  ACTIVITY LIMITATIONS: reaching, lifting, carry,  cleaning, driving, and or occupation  PERSONAL FACTORS:  also affecting patient's functional outcome.  REHAB POTENTIAL: Good  CLINICAL DECISION MAKING: Stable/uncomplicated  EVALUATION COMPLEXITY: Low    GOALS: Short term PT Goals Target date: 11/18/2021 Pt will be I and compliant with HEP. Baseline:  Goal status: New  Long term PT goals Target date: 12/19/2021 Pt will improve cervical AROM to >/= 60 degrees of cervical rotation  to improve functional mobility Baseline: Goal status: New  Pt will improve shoulder strength to 5/5 MMT to improve functional strength Baseline: Goal status: New  Pt will improve FOTO to > 68 % functional to show improved function Baseline: Goal status: New  Pt will reduce pain to overall less than 3/10 with usual activity and work  activity. Baseline: Goal status: New  PLAN: PT FREQUENCY: 2x/ week   PT DURATION: 8 weeks  PLANNED INTERVENTIONS (unless contraindicated): aquatic PT, Canalith repositioning, cryotherapy, Electrical stimulation, Iontophoresis with 4 mg/ml dexamethasome, Moist heat, traction, Ultrasound, gait training, Therapeutic exercise, balance training, neuromuscular re-education, patient/family education, prosthetic training, manual techniques, passive ROM, dry needling, taping, vasopnuematic device, vestibular, spinal manipulations, joint manipulations  PLAN FOR NEXT SESSION: Assess HEP/update PRN, continue to progress left shoulder mobility, strengthen shoulder and cervical  muscles. Consider DN to upper traps and cervical paraspinals, pt was issued handout at evaluation.     Kearney Hard, PT, MPT Note created by Charyl Dancer PT, completed by Kearney Hard PT, MPT 10/21/21 12:23 PM

## 2021-10-21 ENCOUNTER — Encounter: Payer: Self-pay | Admitting: Physical Therapy

## 2021-10-21 ENCOUNTER — Ambulatory Visit: Payer: Medicare Other | Admitting: Physical Therapy

## 2021-10-21 DIAGNOSIS — M542 Cervicalgia: Secondary | ICD-10-CM

## 2021-10-21 DIAGNOSIS — M25512 Pain in left shoulder: Secondary | ICD-10-CM

## 2021-10-21 DIAGNOSIS — M6281 Muscle weakness (generalized): Secondary | ICD-10-CM

## 2021-10-21 DIAGNOSIS — G8929 Other chronic pain: Secondary | ICD-10-CM

## 2021-10-22 ENCOUNTER — Telehealth: Payer: Self-pay | Admitting: Orthopaedic Surgery

## 2021-10-22 DIAGNOSIS — K219 Gastro-esophageal reflux disease without esophagitis: Secondary | ICD-10-CM | POA: Diagnosis not present

## 2021-10-22 NOTE — Telephone Encounter (Signed)
Patient states the nabumetone is making her acid reflux bad I told her to stop taking it and she said that the zanaflex made her tired. I told her it will do that ,it is a relaxer and that maybe she should just take it at night

## 2021-10-22 NOTE — Telephone Encounter (Signed)
Patient has a questions about her medications. The best number to reach her [phone]

## 2021-10-23 NOTE — Telephone Encounter (Signed)
Patient has more questions about medication and would like a call back to follow up on discussions from yesterday

## 2021-10-23 NOTE — Telephone Encounter (Signed)
Patient aware she doesn't HAVE to take anything, she can just use Tylenol

## 2021-10-28 ENCOUNTER — Encounter: Payer: Self-pay | Admitting: Physical Therapy

## 2021-10-28 ENCOUNTER — Ambulatory Visit: Payer: Medicare Other | Admitting: Physical Therapy

## 2021-10-28 DIAGNOSIS — M25512 Pain in left shoulder: Secondary | ICD-10-CM | POA: Diagnosis not present

## 2021-10-28 DIAGNOSIS — G8929 Other chronic pain: Secondary | ICD-10-CM | POA: Diagnosis not present

## 2021-10-28 DIAGNOSIS — M6281 Muscle weakness (generalized): Secondary | ICD-10-CM | POA: Diagnosis not present

## 2021-10-28 DIAGNOSIS — M25511 Pain in right shoulder: Secondary | ICD-10-CM

## 2021-10-28 DIAGNOSIS — M542 Cervicalgia: Secondary | ICD-10-CM

## 2021-10-28 NOTE — Therapy (Signed)
OUTPATIENT PHYSICAL THERAPY TREATMENT NOTE   Patient Name: Anna [name] MRN: [id number] DOB:05-08-1945, 75 y.o., female Today's Date: 10/28/2021  PCP:  [name], MD   REFERRING PROVIDER: [name], MD   END OF SESSION:   PT End of Session - 10/28/21 0810     Visit Number 2    Number of Visits 16    Date for PT Re-Evaluation 12/19/21    Authorization Type UHC/ Medicare    PT Start Time 0800    PT Stop Time 0845    PT Time Calculation (min) 45 min    Activity Tolerance Patient tolerated treatment well    Behavior During Therapy WFL for tasks assessed/performed             Past Medical History:  Diagnosis Date   Arthritis    Breast cancer [facility])    age 44   Family history of adverse reaction to anesthesia    Son takes longer to wake   Headache    Hyperlipidemia    Osteopenia    Vision abnormalities    Past Surgical History:  Procedure Laterality Date   BACK SURGERY     lumbar region   BUNIONECTOMY Bilateral    MASTECTOMY Left    at age 54   TOTAL HIP ARTHROPLASTY Right 04/22/2021   Procedure: Right TOTAL HIP ARTHROPLASTY ANTERIOR APPROACH;  Surgeon: [name], MD;  Location: [place];  Service: Orthopedics;  Laterality: Right;   TRIGGER FINGER RELEASE Left    left thumb   TUBAL LIGATION     Patient Active Problem List   Diagnosis Date Noted   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[facility]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 of left thumb 06/10/2021   Digital mucous cyst of finger 06/10/2021   Status post total replacement of right hip 04/22/2021   Mixed hyperlipidemia 02/06/2021   Musculoskeletal chest pain 02/06/2021   Obesity (BMI 30-39.9) 02/06/2021   Unilateral primary osteoarthritis, right hip 11/06/2020   Unilateral primary osteoarthritis, right knee 11/06/2020   Chest pain of uncertain etiology 12/87/8676   Cardiac murmur 08/29/2020   Mixed dyslipidemia 08/29/2020   Arthritis 08/16/2020   Breast cancer ([place]) 08/16/2020   Headache 08/16/2020   Osteopenia  08/16/2020   Vision abnormalities 08/16/2020   Chronic right shoulder pain 03/07/2018   Chronic migraine 08/11/2017    REFERRING DIAG: Chronic left shoulder pain [M25.512, G89.29]  THERAPY DIAG:  Chronic left shoulder pain  Cervicalgia  Muscle weakness (generalized)  Chronic right shoulder pain  Rationale for Evaluation and Treatment Rehabilitation  PERTINENT HISTORY: Arthritis, OA, THA    PRECAUTIONS: none  SUBJECTIVE: Pt arriving to therapy reporting feeling "pretty good" at present. Pt reporting pain more with movements.   PAIN:  Are you having pain? Yes: NPRS scale: 3/10 Pain location: left shoulder Pain description: achy Aggravating factors: flexion, reaching Relieving factors: resting   OBJECTIVE: (objective measures completed at initial evaluation unless otherwise dated)  DIAGNOSTIC FINDINGS:  None    PATIENT SURVEYS:  10/21/21: FOTO 68% (predicted 68%)     COGNITION: Overall cognitive status: Within functional limits for tasks assessed     SENSATION: WFL   POSTURE: rounded shoulders, forward head, and increased lumbar lordosis   PALPATION: 10/21/21: TTP, bilateral upper traps and medial scapular border with tightness noted in mid trap and rhomboids           CERVICAL ROM:    A: active ROM P= passive ROM A/PROM (deg) 10/21/21 sitting  Flexion 20  Extension 10  Right  lateral flexion 12  Left lateral flexion 14  Right rotation 30  Left rotation 28   (Blank rows = not tested)   UPPER EXTREMITY ROM:   A=active ROM P= passive Right 10/21/21 supine Left 10/21/21 supine  Shoulder flexion 150 135  Shoulder extension      Shoulder abduction 154 146  Shoulder adduction      Shoulder extension      Shoulder internal rotation 80  shoulder abd 45 deg 68 Shoulder abd 45 deg    Shoulder external rotation 65 Shoulder abd 45 deg 70 Shoulder ab 45 deg  Elbow flexion 140    Elbow extension 0     (Blank rows = not tested)   UPPER EXTREMITY  MMT:   MMT Right eval Left eval  Shoulder flexion 5/5 4+/5  Shoulder extension 5/5 4+/5  Shoulder abduction      Shoulder adduction      Shoulder extension      Shoulder internal rotation 5/5 4+/5  Shoulder external rotation 5/5 4+/5  Middle trapezius      Lower trapezius       (Blank rows = not tested)       FUNCTIONAL TESTS:  10/21/21: 5 times sit to stand: 17 seconds UE support     TODAY'S TREATMENT:  10/28/21:  TherEx: UBE Level 2 x  2 minutes each direction Rows: Level 3 band 2 x 15 Cervical retraction x 10 holding 3 seconds Standing AAROM: shoulder flexion c 1 # bar x 10 Supine AAROM shoulder flexion 1 # bar 2 x 10 Supine ball squeezes holding 5 sec x 10  Supine Serratus punches x 10 Left shoulder Supine cervical retraction x 15 holding 5 sec Manual: STM and active trigger point release and skilled palpation during TPDN Trigger Point Dry-Needling  Treatment instructions: Expect mild to moderate muscle soreness. S/S of pneumothorax if dry needled over a lung field, and to seek immediate medical attention should they occur. Patient verbalized understanding of these instructions and education. Patient Consent Given: Yes Education handout provided: Yes Muscles treated: cervical multifidi, sub occipitals,  left upper trap, left deltoid Treatment response/outcome: twitch response  Modalities:  Moist heat cervical spine x 5 minutes   10/21/21:  TherEx:  HEP established and reviewed see below exercises, trial of each performed     PATIENT EDUCATION:  Education details: FOTO, diagnosis, prognosis, HEP, TPDN, and PT POC Person educated: Patient Education method: Explanation and Demonstration, handout issued about TPDN for future session Education comprehension: verbalized understanding and returned demonstration     HOME EXERCISE PROGRAM: Access Code: [email or web address] URL: [email or web address] Date: 10/21/2021 Prepared by: Kearney Hard   Exercises -  Supine Shoulder External Rotation in 45 Degrees Abduction AAROM with Dowel  - 2 x daily - 7 x weekly - 15 reps - 3 seconds hold - Supine Shoulder Flexion Extension AAROM with Dowel  - 2-3 x daily - 7 x weekly - 15 reps - 3 seconds hold - Standing Shoulder Row with Anchored Resistance  - 2 x daily - 7 x weekly - 2 sets - 15 reps - 3 seconds hold - Supine Cervical Retraction with Towel  - 2 x daily - 7 x weekly - 10 reps - 5 seconds hold - Seated Upper Trap Stretch  - 2 x daily - 7 x weekly - 5 reps - 10 seconds hold - Seated Assisted Cervical Rotation with Towel  - 2 x daily - 7 x weekly - 5 reps - 10 seconds  hold   ASSESSMENT:   CLINICAL IMPRESSION:  Pt arriving today reporting 3/10 pain in her left shoulder and cervical spine. Pt with good response to TPDN to cervical paraspinals, suboccipitals, and left upper trap and middle deltoid. No new home exercises added this visit. Pt instructed to continue current HEP. Continue skilled PT to maximize pt's function.   OBJECTIVE IMPAIRMENTS: decreased activity tolerance, decreased shoulder mobility, decreased ROM, decreased strength, impaired flexibility, impaired UE use, postural dysfunction, and pain.   ACTIVITY LIMITATIONS: reaching, lifting, carry,  cleaning, driving, and or occupation   PERSONAL FACTORS:  also affecting patient's functional outcome.   REHAB POTENTIAL: Good   CLINICAL DECISION MAKING: Stable/uncomplicated   EVALUATION COMPLEXITY: Low       GOALS: Short term PT Goals Target date: 11/18/2021 Pt will be I and compliant with HEP. Baseline:  Goal status: New   Long term PT goals Target date: 12/19/2021 Pt will improve cervical AROM to >/= 60 degrees of cervical rotation  to improve functional mobility Baseline: Goal status: New   Pt will improve shoulder strength to 5/5 MMT to improve functional strength Baseline: Goal status: New   Pt will improve FOTO to > 68 % functional to show improved function Baseline: Goal  status: New   Pt will reduce pain to overall less than 3/10 with usual activity and work activity. Baseline: Goal status: New   PLAN: PT FREQUENCY: 2x/ week    PT DURATION: 8 weeks   PLANNED INTERVENTIONS (unless contraindicated): aquatic PT, Canalith repositioning, cryotherapy, Electrical stimulation, Iontophoresis with 4 mg/ml dexamethasome, Moist heat, traction, Ultrasound, gait training, Therapeutic exercise, balance training, neuromuscular re-education, patient/family education, prosthetic training, manual techniques, passive ROM, dry needling, taping, vasopnuematic device, vestibular, spinal manipulations, joint manipulations   PLAN FOR NEXT SESSION: Assess HEP/update PRN, continue to progress left shoulder mobility, strengthen shoulder and cervical  muscles. Assess response to DN.          Oretha Caprice, PT, MPT 10/28/2021, 8:54 AM

## 2021-10-29 ENCOUNTER — Telehealth: Payer: Self-pay | Admitting: Orthopaedic Surgery

## 2021-10-29 NOTE — Telephone Encounter (Signed)
Patient is scheduled for a dental cleaning on Friday 10/31/21. Andriana with Dr Antionette Char office would like to know if pre meds are required. Call back number [phone]  Fax number [phone]

## 2021-10-29 NOTE — Telephone Encounter (Signed)
faxed

## 2021-10-29 NOTE — Telephone Encounter (Signed)
Letter completed to be faxed. Do you mind faxing this for me?

## 2021-10-30 ENCOUNTER — Ambulatory Visit: Payer: Medicare Other | Admitting: Rehabilitative and Restorative Service Providers"

## 2021-10-30 ENCOUNTER — Encounter: Payer: Self-pay | Admitting: Rehabilitative and Restorative Service Providers"

## 2021-10-30 DIAGNOSIS — M542 Cervicalgia: Secondary | ICD-10-CM

## 2021-10-30 DIAGNOSIS — M6281 Muscle weakness (generalized): Secondary | ICD-10-CM

## 2021-10-30 DIAGNOSIS — M25512 Pain in left shoulder: Secondary | ICD-10-CM

## 2021-10-30 DIAGNOSIS — M25511 Pain in right shoulder: Secondary | ICD-10-CM

## 2021-10-30 DIAGNOSIS — G8929 Other chronic pain: Secondary | ICD-10-CM | POA: Diagnosis not present

## 2021-10-30 NOTE — Therapy (Signed)
OUTPATIENT PHYSICAL THERAPY TREATMENT NOTE   Patient Name: Anna [name] MRN: [id number] DOB:1945-06-06, 76 y.o., female Today's Date: 10/30/2021  PCP:  [name], MD   REFERRING PROVIDER: [name], MD   END OF SESSION:   PT End of Session - 10/30/21 0847     Visit Number 3    Number of Visits 16    Date for PT Re-Evaluation 12/19/21    Authorization Type UHC/ Medicare    PT Start Time 0845    PT Stop Time 0931    PT Time Calculation (min) 46 min    Activity Tolerance Patient tolerated treatment well;No increased pain    Behavior During Therapy WFL for tasks assessed/performed              Past Medical History:  Diagnosis Date   Arthritis    Breast cancer [facility])    age 74   Family history of adverse reaction to anesthesia    Son takes longer to wake   Headache    Hyperlipidemia    Osteopenia    Vision abnormalities    Past Surgical History:  Procedure Laterality Date   BACK SURGERY     lumbar region   BUNIONECTOMY Bilateral    MASTECTOMY Left    at age 29   TOTAL HIP ARTHROPLASTY Right 04/22/2021   Procedure: Right TOTAL HIP ARTHROPLASTY ANTERIOR APPROACH;  Surgeon: [name], MD;  Location: [place];  Service: Orthopedics;  Laterality: Right;   TRIGGER FINGER RELEASE Left    left thumb   TUBAL LIGATION     Patient Active Problem List   Diagnosis Date Noted   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[facility]) joint of left thumb 06/10/2021   Digital mucous cyst of finger 06/10/2021   Status post total replacement of right hip 04/22/2021   Mixed hyperlipidemia 02/06/2021   Musculoskeletal chest pain 02/06/2021   Obesity (BMI 30-39.9) 02/06/2021   Unilateral primary osteoarthritis, right hip 11/06/2020   Unilateral primary osteoarthritis, right knee 11/06/2020   Chest pain of uncertain etiology 49/70/2637   Cardiac murmur 08/29/2020   Mixed dyslipidemia 08/29/2020   Arthritis 08/16/2020   Breast cancer ([place]) 08/16/2020   Headache  08/16/2020   Osteopenia 08/16/2020   Vision abnormalities 08/16/2020   Chronic right shoulder pain 03/07/2018   Chronic migraine 08/11/2017    REFERRING DIAG: Chronic left shoulder pain [M25.512, G89.29]  THERAPY DIAG:  Chronic left shoulder pain  Cervicalgia  Muscle weakness (generalized)  Chronic right shoulder pain  Rationale for Evaluation and Treatment Rehabilitation  PERTINENT HISTORY: Arthritis, OA, THA    PRECAUTIONS: none  SUBJECTIVE: Pt arriving to therapy reporting feeling "pretty good" at present. Pt reporting pain more with movements.   PAIN:  Are you having pain? Yes: NPRS scale: 3/10 Pain location: left shoulder Pain description: achy Aggravating factors: flexion, reaching Relieving factors: resting   OBJECTIVE: (objective measures completed at initial evaluation unless otherwise dated)  DIAGNOSTIC FINDINGS:  None    PATIENT SURVEYS:  10/21/21: FOTO 68% (predicted 68%)     COGNITION: Overall cognitive status: Within functional limits for tasks assessed     SENSATION: WFL   POSTURE: rounded shoulders, forward head, and increased lumbar lordosis   PALPATION: 10/21/21: TTP, bilateral upper traps and medial scapular border with tightness noted in mid trap and rhomboids           CERVICAL ROM:    A: active ROM P= passive ROM A/PROM (deg) 10/21/21 sitting  Flexion 20  Extension  10  Right lateral flexion 12  Left lateral flexion 14  Right rotation 30  Left rotation 28   (Blank rows = not tested)   UPPER EXTREMITY ROM:   A=active ROM P= passive Right 10/21/21 supine Left 10/21/21 supine Lt/Rt in degrees 10/30/2021  Shoulder flexion 150 135 135/140  Shoulder extension       Shoulder abduction 154 146   Shoulder horizontal adduction     20/30  Shoulder extension       Shoulder internal rotation 80  shoulder abd 45 deg 68 Shoulder abd 45 deg   20/35  Shoulder external rotation 65 Shoulder abd 45 deg 70 Shoulder ab 45 deg 70/80   Elbow flexion 140     Elbow extension 0      (Blank rows = not tested)   UPPER EXTREMITY MMT:   MMT Right eval Left eval  Shoulder flexion 5/5 4+/5  Shoulder extension 5/5 4+/5  Shoulder abduction      Shoulder adduction      Shoulder extension      Shoulder internal rotation 5/5 4+/5  Shoulder external rotation 5/5 4+/5  Middle trapezius      Lower trapezius       (Blank rows = not tested)       FUNCTIONAL TESTS:  10/21/21: 5 times sit to stand: 17 seconds UE support     TODAY'S TREATMENT:  10/30/21: Rows with Blue theraband 20X 3 seconds Cervical retraction/Extension Isometrics 10X 5 seconds Supine shoulder flexion AROM (palms in, protract 1st) 10X 3 seconds Standing cervical rotation and lateral bending/upper trap AROM 10X 5 seconds each (shoulder back, perfect posture) Shoulder blade pinches 10X 5 seconds Thumb up the back stretch (to address tight shoulder IR) 10X 10 seconds Bil   10/28/21:  TherEx: UBE Level 2 x  2 minutes each direction Rows: Level 3 band 2 x 15 Cervical retraction x 10 holding 3 seconds Standing AAROM: shoulder flexion c 1 # bar x 10 Supine AAROM shoulder flexion 1 # bar 2 x 10 Supine ball squeezes holding 5 sec x 10  Supine Serratus punches x 10 Left shoulder Supine cervical retraction x 15 holding 5 sec Manual: STM and active trigger point release and skilled palpation during TPDN Trigger Point Dry-Needling  Treatment instructions: Expect mild to moderate muscle soreness. S/S of pneumothorax if dry needled over a lung field, and to seek immediate medical attention should they occur. Patient verbalized understanding of these instructions and education. Patient Consent Given: Yes Education handout provided: Yes Muscles treated: cervical multifidi, sub occipitals,  left upper trap, left deltoid Treatment response/outcome: twitch response  Modalities:  Moist heat cervical spine x 5 minutes   10/21/21:  TherEx:  HEP established and  reviewed see below exercises, trial of each performed     PATIENT EDUCATION:  Education details: FOTO, diagnosis, prognosis, HEP, TPDN, and PT POC Person educated: Patient Education method: Explanation and Demonstration, handout issued about TPDN for future session Education comprehension: verbalized understanding and returned demonstration     HOME EXERCISE PROGRAM: Access Code: [email or web address] URL: [email or web address] Date: 10/30/2021 Prepared by: Vista Mink  Exercises - Supine Shoulder External Rotation in 45 Degrees Abduction AAROM with Dowel  - 2 x daily - 7 x weekly - 15 reps - 3 seconds hold - Supine Shoulder Flexion Extension AAROM with Dowel  - 2-3 x daily - 7 x weekly - 15 reps - 3 seconds hold - Standing Shoulder Row with Anchored Resistance  - 2 x daily -  7 x weekly - 2 sets - 15 reps - 3 seconds hold - Supine Cervical Retraction with Towel  - 2 x daily - 7 x weekly - 10 reps - 5 seconds hold - Seated Upper Trap Stretch  - 2 x daily - 7 x weekly - 5 reps - 10 seconds hold - Seated Assisted Cervical Rotation with Towel  - 2 x daily - 7 x weekly - 5 reps - 10 seconds hold - Standing Scapular Retraction  - 5 x daily - 7 x weekly - 1 sets - 5 reps - 5 second hold - Standing Shoulder Internal Rotation Stretch with Hands Behind Back  - 3-5 x daily - 7 x weekly - 1 sets - 5 reps - 10 seconds hold   ASSESSMENT:   CLINICAL IMPRESSION:  Anna [name] notes early progress with her posture, pain and cervical/shoulder AROM.  We reviewed her HEP with additions of shoulder blade pinches for scapular and postural strengthening and a thumb up the back stretch to address shoulder IR AROM impairments Bil.  Anna [name] is on track to meet goals with continued HEP compliance and the recommended course of physical therapy.   OBJECTIVE IMPAIRMENTS: decreased activity tolerance, decreased shoulder mobility, decreased ROM, decreased strength, impaired flexibility, impaired UE use, postural  dysfunction, and pain.   ACTIVITY LIMITATIONS: reaching, lifting, carry,  cleaning, driving, and or occupation   PERSONAL FACTORS:  also affecting patient's functional outcome.   REHAB POTENTIAL: Good   CLINICAL DECISION MAKING: Stable/uncomplicated   EVALUATION COMPLEXITY: Low       GOALS: Short term PT Goals Target date: 11/18/2021 Pt will be I and compliant with HEP. Baseline:  Goal status: On Going 10/30/2021   Long term PT goals Target date: 12/19/2021 Pt will improve cervical AROM to >/= 60 degrees of cervical rotation  to improve functional mobility Baseline: Goal status: New   Pt will improve shoulder strength to 5/5 MMT to improve functional strength Baseline: Goal status: New   Pt will improve FOTO to > 68 % functional to show improved function Baseline: Goal status: New   Pt will reduce pain to overall less than 3/10 with usual activity and work activity. Baseline: Goal status: On Going 10/30/2021   PLAN: PT FREQUENCY: 2x/ week    PT DURATION: 8 weeks   PLANNED INTERVENTIONS (unless contraindicated): aquatic PT, Canalith repositioning, cryotherapy, Electrical stimulation, Iontophoresis with 4 mg/ml dexamethasome, Moist heat, traction, Ultrasound, gait training, Therapeutic exercise, balance training, neuromuscular re-education, patient/family education, prosthetic training, manual techniques, passive ROM, dry needling, taping, vasopnuematic device, vestibular, spinal manipulations, joint manipulations   PLAN FOR NEXT SESSION: Assess HEP/update PRN, continue to progress Bil shoulder mobility, strengthen scapular, shoulder and cervical extensor muscles.  Assess response to DN.          Farley Ly, PT, MPT 10/30/2021, 10:54 AM

## 2021-11-04 ENCOUNTER — Encounter: Payer: Self-pay | Admitting: Physical Therapy

## 2021-11-04 ENCOUNTER — Ambulatory Visit: Payer: Medicare Other | Admitting: Physical Therapy

## 2021-11-04 DIAGNOSIS — G8929 Other chronic pain: Secondary | ICD-10-CM

## 2021-11-04 DIAGNOSIS — M25512 Pain in left shoulder: Secondary | ICD-10-CM | POA: Diagnosis not present

## 2021-11-04 DIAGNOSIS — M25511 Pain in right shoulder: Secondary | ICD-10-CM | POA: Diagnosis not present

## 2021-11-04 DIAGNOSIS — M542 Cervicalgia: Secondary | ICD-10-CM | POA: Diagnosis not present

## 2021-11-04 DIAGNOSIS — M6281 Muscle weakness (generalized): Secondary | ICD-10-CM | POA: Diagnosis not present

## 2021-11-04 NOTE — Therapy (Signed)
OUTPATIENT PHYSICAL THERAPY TREATMENT NOTE   Patient Name: Anna [name] MRN: [id number] DOB:Apr 19, 1945, 76 y.o., female Today's Date: 11/04/2021  PCP:  [name], MD   REFERRING PROVIDER: [name], MD   END OF SESSION:   PT End of Session - 11/04/21 0924     Visit Number 4    Number of Visits 16    Date for PT Re-Evaluation 12/19/21    Authorization Type UHC/ Medicare    PT Start Time 0845    PT Stop Time 0924    PT Time Calculation (min) 39 min    Activity Tolerance Patient tolerated treatment well    Behavior During Therapy WFL for tasks assessed/performed               Past Medical History:  Diagnosis Date   Arthritis    Breast cancer [facility])    age 65   Family history of adverse reaction to anesthesia    Son takes longer to wake   Headache    Hyperlipidemia    Osteopenia    Vision abnormalities    Past Surgical History:  Procedure Laterality Date   BACK SURGERY     lumbar region   BUNIONECTOMY Bilateral    MASTECTOMY Left    at age 62   TOTAL HIP ARTHROPLASTY Right 04/22/2021   Procedure: Right TOTAL HIP ARTHROPLASTY ANTERIOR APPROACH;  Surgeon: [name], MD;  Location: [place];  Service: Orthopedics;  Laterality: Right;   TRIGGER FINGER RELEASE Left    left thumb   TUBAL LIGATION     Patient Active Problem List   Diagnosis Date Noted   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[facility]) joint of left thumb 06/10/2021   Digital mucous cyst of finger 06/10/2021   Status post total replacement of right hip 04/22/2021   Mixed hyperlipidemia 02/06/2021   Musculoskeletal chest pain 02/06/2021   Obesity (BMI 30-39.9) 02/06/2021   Unilateral primary osteoarthritis, right hip 11/06/2020   Unilateral primary osteoarthritis, right knee 11/06/2020   Chest pain of uncertain etiology 01/75/1025   Cardiac murmur 08/29/2020   Mixed dyslipidemia 08/29/2020   Arthritis 08/16/2020   Breast cancer ([place]) 08/16/2020   Headache 08/16/2020    Osteopenia 08/16/2020   Vision abnormalities 08/16/2020   Chronic right shoulder pain 03/07/2018   Chronic migraine 08/11/2017    REFERRING DIAG: Chronic left shoulder pain [M25.512, G89.29]  THERAPY DIAG:  Chronic left shoulder pain  Cervicalgia  Muscle weakness (generalized)  Chronic right shoulder pain  Rationale for Evaluation and Treatment Rehabilitation  PERTINENT HISTORY: Arthritis, OA, THA    PRECAUTIONS: none  SUBJECTIVE: Pt arriving to therapy reporting no pain at present. Pt reporting good response to DN the first visit and feels like the HEP is going well.   PAIN:  Are you having pain? Not a present   OBJECTIVE: (objective measures completed at initial evaluation unless otherwise dated)  DIAGNOSTIC FINDINGS:  None    PATIENT SURVEYS:  10/21/21: FOTO 68% (predicted 68%)     COGNITION: Overall cognitive status: Within functional limits for tasks assessed     SENSATION: WFL   POSTURE: rounded shoulders, forward head, and increased lumbar lordosis   PALPATION: 10/21/21: TTP, bilateral upper traps and medial scapular border with tightness noted in mid trap and rhomboids           CERVICAL ROM:    A: active ROM P= passive ROM A/PROM (deg) 10/21/21 sitting  Flexion 20  Extension 10  Right lateral flexion 12  Left lateral flexion 14  Right rotation 30  Left rotation 28   (Blank rows = not tested)   UPPER EXTREMITY ROM:   A=active ROM P= passive Right 10/21/21 supine Left 10/21/21 supine Lt/Rt in degrees 10/30/2021  Shoulder flexion 150 135 135/140  Shoulder extension       Shoulder abduction 154 146   Shoulder horizontal adduction     20/30  Shoulder extension       Shoulder internal rotation 80  shoulder abd 45 deg 68 Shoulder abd 45 deg   20/35  Shoulder external rotation 65 Shoulder abd 45 deg 70 Shoulder ab 45 deg 70/80  Elbow flexion 140     Elbow extension 0      (Blank rows = not tested)   UPPER EXTREMITY MMT:   MMT  Right eval Left eval  Shoulder flexion 5/5 4+/5  Shoulder extension 5/5 4+/5  Shoulder abduction      Shoulder adduction      Shoulder extension      Shoulder internal rotation 5/5 4+/5  Shoulder external rotation 5/5 4+/5  Middle trapezius      Lower trapezius       (Blank rows = not tested)       FUNCTIONAL TESTS:  10/21/21: 5 times sit to stand: 17 seconds UE support     TODAY'S TREATMENT:   11/04/21:  TherEx: UBE Level 2 x  2 minutes each direction Rows: Level 3 band 2 x 15 Standing shoulder abd c 1 # 2 x 10 Standing AAROM: shoulder flexion c 1 # bar x 10 Standing with back against the wall with ball in mid thoracic spine to elicit mild thoracic extension while performing active shoulder flexion  Supine AAROM shoulder flexion 1 # bar x 15 with towel roll under spine positioned horizontal Supine cervical retraction x 15 holding 5 sec Supine cervical rotation x 10 holding 3 sec    10/30/21: Rows with Blue theraband 20X 3 seconds Cervical retraction/Extension Isometrics 10X 5 seconds Supine shoulder flexion AROM (palms in, protract 1st) 10X 3 seconds Standing cervical rotation and lateral bending/upper trap AROM 10X 5 seconds each (shoulder back, perfect posture) Shoulder blade pinches 10X 5 seconds Thumb up the back stretch (to address tight shoulder IR) 10X 10 seconds Bil   10/28/21:  TherEx: UBE Level 2 x  2 minutes each direction Rows: Level 3 band 2 x 15 Cervical retraction x 10 holding 3 seconds Standing AAROM: shoulder flexion c 1 # bar x 10 Supine AAROM shoulder flexion 1 # bar 2 x 10 Supine ball squeezes holding 5 sec x 10  Supine Serratus punches x 10 Left shoulder Supine cervical retraction x 15 holding 5 sec Manual: STM and active trigger point release and skilled palpation during TPDN Trigger Point Dry-Needling  Treatment instructions: Expect mild to moderate muscle soreness. S/S of pneumothorax if dry needled over a lung field, and to seek immediate  medical attention should they occur. Patient verbalized understanding of these instructions and education. Patient Consent Given: Yes Education handout provided: Yes Muscles treated: cervical multifidi, sub occipitals,  left upper trap, left deltoid Treatment response/outcome: twitch response  Modalities:  Moist heat cervical spine x 5 minutes        PATIENT EDUCATION:  Education details: FOTO, diagnosis, prognosis, HEP, TPDN, and PT POC Person educated: Patient Education method: Explanation and Demonstration, handout issued about TPDN for future session Education comprehension: verbalized understanding and returned demonstration     HOME EXERCISE PROGRAM: Access  Code: [email or web address] URL: [email or web address] Date: 10/30/2021 Prepared by: Vista Mink  Exercises - Supine Shoulder External Rotation in 45 Degrees Abduction AAROM with Dowel  - 2 x daily - 7 x weekly - 15 reps - 3 seconds hold - Supine Shoulder Flexion Extension AAROM with Dowel  - 2-3 x daily - 7 x weekly - 15 reps - 3 seconds hold - Standing Shoulder Row with Anchored Resistance  - 2 x daily - 7 x weekly - 2 sets - 15 reps - 3 seconds hold - Supine Cervical Retraction with Towel  - 2 x daily - 7 x weekly - 10 reps - 5 seconds hold - Seated Upper Trap Stretch  - 2 x daily - 7 x weekly - 5 reps - 10 seconds hold - Seated Assisted Cervical Rotation with Towel  - 2 x daily - 7 x weekly - 5 reps - 10 seconds hold - Standing Scapular Retraction  - 5 x daily - 7 x weekly - 1 sets - 5 reps - 5 second hold - Standing Shoulder Internal Rotation Stretch with Hands Behind Back  - 3-5 x daily - 7 x weekly - 1 sets - 5 reps - 10 seconds hold   ASSESSMENT:   CLINICAL IMPRESSION:  Pt presenting today with no pain. Pt reporting compliance in her HEP. Pt tolerating all exercises well working on cervical mobility and postural correction/strengthening exercises. Continue skilled PT to maximize pt's function.    OBJECTIVE  IMPAIRMENTS: decreased activity tolerance, decreased shoulder mobility, decreased ROM, decreased strength, impaired flexibility, impaired UE use, postural dysfunction, and pain.   ACTIVITY LIMITATIONS: reaching, lifting, carry,  cleaning, driving, and or occupation   PERSONAL FACTORS:  also affecting patient's functional outcome.   REHAB POTENTIAL: Good   CLINICAL DECISION MAKING: Stable/uncomplicated   EVALUATION COMPLEXITY: Low       GOALS: Short term PT Goals Target date: 11/18/2021 Pt will be I and compliant with HEP. Baseline:  Goal status: On Going 10/30/2021   Long term PT goals Target date: 12/19/2021 Pt will improve cervical AROM to >/= 60 degrees of cervical rotation  to improve functional mobility Baseline: Goal status: New   Pt will improve shoulder strength to 5/5 MMT to improve functional strength Baseline: Goal status: New   Pt will improve FOTO to > 68 % functional to show improved function Baseline: Goal status: New   Pt will reduce pain to overall less than 3/10 with usual activity and work activity. Baseline: Goal status: On Going 10/30/2021   PLAN: PT FREQUENCY: 2x/ week    PT DURATION: 8 weeks   PLANNED INTERVENTIONS (unless contraindicated): aquatic PT, Canalith repositioning, cryotherapy, Electrical stimulation, Iontophoresis with 4 mg/ml dexamethasome, Moist heat, traction, Ultrasound, gait training, Therapeutic exercise, balance training, neuromuscular re-education, patient/family education, prosthetic training, manual techniques, passive ROM, dry needling, taping, vasopnuematic device, vestibular, spinal manipulations, joint manipulations   PLAN FOR NEXT SESSION: Assess HEP/update PRN, continue to progress Bil shoulder mobility, strengthen scapular, shoulder and cervical extensor muscles.           Oretha Caprice, PT, MPT 11/04/2021, 9:25 AM

## 2021-11-06 ENCOUNTER — Encounter: Payer: Self-pay | Admitting: Rehabilitative and Restorative Service Providers"

## 2021-11-06 ENCOUNTER — Ambulatory Visit: Payer: Medicare Other | Admitting: Rehabilitative and Restorative Service Providers"

## 2021-11-06 DIAGNOSIS — G8929 Other chronic pain: Secondary | ICD-10-CM

## 2021-11-06 DIAGNOSIS — M542 Cervicalgia: Secondary | ICD-10-CM

## 2021-11-06 DIAGNOSIS — M6281 Muscle weakness (generalized): Secondary | ICD-10-CM | POA: Diagnosis not present

## 2021-11-06 DIAGNOSIS — M25511 Pain in right shoulder: Secondary | ICD-10-CM | POA: Diagnosis not present

## 2021-11-06 DIAGNOSIS — M25512 Pain in left shoulder: Secondary | ICD-10-CM

## 2021-11-06 NOTE — Therapy (Signed)
OUTPATIENT PHYSICAL THERAPY TREATMENT NOTE   Patient Name: Anna [name] MRN: [id number] DOB:[birth year], 76 y.o., female Today's Date: 11/06/2021  PCP:  [name], MD   REFERRING PROVIDER: [name], MD   END OF SESSION:   PT End of Session - 11/06/21 0932     Visit Number 5    Number of Visits 16    Date for PT Re-Evaluation 12/19/21    Authorization Type UHC/ Medicare    PT Start Time 0845    PT Stop Time 0930    PT Time Calculation (min) 45 min    Activity Tolerance Patient tolerated treatment well;No increased pain    Behavior During Therapy WFL for tasks assessed/performed                Past Medical History:  Diagnosis Date   Arthritis    Breast cancer [facility])    age 14   Family history of adverse reaction to anesthesia    Son takes longer to wake   Headache    Hyperlipidemia    Osteopenia    Vision abnormalities    Past Surgical History:  Procedure Laterality Date   BACK SURGERY     lumbar region   BUNIONECTOMY Bilateral    MASTECTOMY Left    at age 55   TOTAL HIP ARTHROPLASTY Right 04/22/2021   Procedure: Right TOTAL HIP ARTHROPLASTY ANTERIOR APPROACH;  Surgeon: [name], MD;  Location: [place];  Service: Orthopedics;  Laterality: Right;   TRIGGER FINGER RELEASE Left    left thumb   TUBAL LIGATION     Patient Active Problem List   Diagnosis Date Noted   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[facility]) joint of left thumb 06/10/2021   Digital mucous cyst of finger 06/10/2021   Status post total replacement of right hip 04/22/2021   Mixed hyperlipidemia 02/06/2021   Musculoskeletal chest pain 02/06/2021   Obesity (BMI 30-39.9) 02/06/2021   Unilateral primary osteoarthritis, right hip 11/06/2020   Unilateral primary osteoarthritis, right knee 11/06/2020   Chest pain of uncertain etiology 27/51/7001   Cardiac murmur 08/29/2020   Mixed dyslipidemia 08/29/2020   Arthritis 08/16/2020   Breast cancer ([place]) 08/16/2020   Headache  08/16/2020   Osteopenia 08/16/2020   Vision abnormalities 08/16/2020   Chronic right shoulder pain 03/07/2018   Chronic migraine 08/11/2017    REFERRING DIAG: Chronic left shoulder pain [M25.512, G89.29]  THERAPY DIAG:  Chronic left shoulder pain  Cervicalgia  Muscle weakness (generalized)  Chronic right shoulder pain  Rationale for Evaluation and Treatment Rehabilitation  PERTINENT HISTORY: Arthritis, OA, THA    PRECAUTIONS: none  SUBJECTIVE: Anna [name] notes she is sleeping a bit better than before starting PT.  Reaching and overhead function is improving.  PAIN:  Are you having pain? 2-4/10 this week   OBJECTIVE: (objective measures completed at initial evaluation unless otherwise dated)  DIAGNOSTIC FINDINGS:  None    PATIENT SURVEYS:  10/21/21: FOTO 68% (predicted 68%)     COGNITION: Overall cognitive status: Within functional limits for tasks assessed     SENSATION: WFL   POSTURE: rounded shoulders, forward head, and increased lumbar lordosis   PALPATION: 10/21/21: TTP, bilateral upper traps and medial scapular border with tightness noted in mid trap and rhomboids           CERVICAL ROM:    A: active ROM P= passive ROM A/PROM (deg) 10/21/21 sitting  Flexion 20  Extension 10  Right lateral flexion 12  Left lateral flexion 14  Right rotation 30  Left rotation 28   (Blank rows = not tested)   UPPER EXTREMITY ROM:   A=active ROM P= passive Right 10/21/21 supine Left 10/21/21 supine Lt/Rt in degrees 10/30/2021 Lt/Rt in degrees 11/06/2021   Shoulder flexion 150 135 135/140 140/140  Shoulder extension        Shoulder abduction 154 146    Shoulder horizontal adduction     20/30 25/30  Shoulder extension        Shoulder internal rotation 80  shoulder abd 45 deg 68 Shoulder abd 45 deg   20/35 40/45  Shoulder external rotation 65 Shoulder abd 45 deg 70 Shoulder ab 45 deg 70/80 80/85  Elbow flexion 140      Elbow extension 0       (Blank rows =  not tested)   UPPER EXTREMITY MMT:   MMT Right eval Left eval  Shoulder flexion 5/5 4+/5  Shoulder extension 5/5 4+/5  Shoulder abduction      Shoulder adduction      Shoulder extension      Shoulder internal rotation 5/5 4+/5  Shoulder external rotation 5/5 4+/5  Middle trapezius      Lower trapezius       (Blank rows = not tested)       FUNCTIONAL TESTS:  10/21/21: 5 times sit to stand: 17 seconds UE support     TODAY'S TREATMENT:  11/06/21: Rows with Blue theraband 20X 3 seconds Cervical retraction/Extension Isometrics 10X 5 seconds Supine shoulder flexion AROM (palms in, protract 1st) 10X 10 seconds Standing cervical rotation and lateral bending/upper trap AROM 10X 5 seconds each (shoulder back, perfect posture) Shoulder blade pinches 10X 5 seconds Thumb up the back stretch (to address tight shoulder IR) 10X 10 seconds Bil Shoulder ER theraband 2 sets of 10 Bil slow eccentrics   11/04/21:  TherEx: UBE Level 2 x  2 minutes each direction Rows: Level 3 band 2 x 15 Standing shoulder abd c 1 # 2 x 10 Standing AAROM: shoulder flexion c 1 # bar x 10 Standing with back against the wall with ball in mid thoracic spine to elicit mild thoracic extension while performing active shoulder flexion  Supine AAROM shoulder flexion 1 # bar x 15 with towel roll under spine positioned horizontal Supine cervical retraction x 15 holding 5 sec Supine cervical rotation x 10 holding 3 sec   10/30/21: Rows with Blue theraband 20X 3 seconds Cervical retraction/Extension Isometrics 10X 5 seconds Supine shoulder flexion AROM (palms in, protract 1st) 10X 3 seconds Standing cervical rotation and lateral bending/upper trap AROM 10X 5 seconds each (shoulder back, perfect posture) Shoulder blade pinches 10X 5 seconds Thumb up the back stretch (to address tight shoulder IR) 10X 10 seconds Bil    PATIENT EDUCATION:  Education details: FOTO, diagnosis, prognosis, HEP, TPDN, and PT POC Person  educated: Patient Education method: Explanation and Demonstration, handout issued about TPDN for future session Education comprehension: verbalized understanding and returned demonstration     HOME EXERCISE PROGRAM: Access Code: [email or web address] URL: [email or web address] Date: 11/06/2021 Prepared by: Vista Mink  Exercises - Supine Shoulder External Rotation in 45 Degrees Abduction AAROM with Dowel  - 2 x daily - 7 x weekly - 15 reps - 3 seconds hold - Supine Shoulder Flexion Extension AAROM with Dowel  - 2-3 x daily - 7 x weekly - 10 reps - 10 seconds hold - Standing Shoulder Row with Anchored Resistance  - 1 x daily - 7 x weekly -  1 sets - 20 reps - 3 seconds hold - Supine Cervical Retraction with Towel  - 2 x daily - 7 x weekly - 10 reps - 5 seconds hold - Seated Upper Trap Stretch  - 2 x daily - 1-7 x weekly - 5 reps - 10 seconds hold - Seated Assisted Cervical Rotation with Towel  - 2 x daily - 7 x weekly - 5 reps - 10 seconds hold - Standing Scapular Retraction  - 5 x daily - 7 x weekly - 1 sets - 5 reps - 5 second hold - Standing Shoulder Internal Rotation Stretch with Hands Behind Back  - 3-5 x daily - 7 x weekly - 1 sets - 5 reps - 10 seconds hold - Shoulder External Rotation with Anchored Resistance  - 1 x daily - 7 x weekly - 2 sets - 10 reps - 3 hold   ASSESSMENT:   CLINICAL IMPRESSION:  Latamara notes functional progress with sleep and UE use since starting PT.  Objectively, she is moving better than a week ago with her Bil shoulder AROM.  Significant shoulder AROM, shoulder and postural strength impairments remain and will benefit from the recommended POC.   OBJECTIVE IMPAIRMENTS: decreased activity tolerance, decreased shoulder mobility, decreased ROM, decreased strength, impaired flexibility, impaired UE use, postural dysfunction, and pain.   ACTIVITY LIMITATIONS: reaching, lifting, carry,  cleaning, driving, and or occupation   PERSONAL FACTORS:  also affecting  patient's functional outcome.   REHAB POTENTIAL: Good   CLINICAL DECISION MAKING: Stable/uncomplicated   EVALUATION COMPLEXITY: Low       GOALS: Short term PT Goals Target date: 11/18/2021 Pt will be I and compliant with HEP. Baseline:  Goal status: On Going 11/06/2021   Long term PT goals Target date: 12/19/2021 Pt will improve cervical AROM to >/= 60 degrees of cervical rotation  to improve functional mobility Baseline: Goal status: New   Pt will improve shoulder strength to 5/5 MMT to improve functional strength Baseline: Goal status: New   Pt will improve FOTO to > 68 % functional to show improved function Baseline: Goal status: New   Pt will reduce pain to overall less than 3/10 with usual activity and work activity. Baseline: Goal status: On Going 11/06/2021   PLAN: PT FREQUENCY: 2x/ week    PT DURATION: 8 weeks   PLANNED INTERVENTIONS (unless contraindicated): aquatic PT, Canalith repositioning, cryotherapy, Electrical stimulation, Iontophoresis with 4 mg/ml dexamethasome, Moist heat, traction, Ultrasound, gait training, Therapeutic exercise, balance training, neuromuscular re-education, patient/family education, prosthetic training, manual techniques, passive ROM, dry needling, taping, vasopnuematic device, vestibular, spinal manipulations, joint manipulations   PLAN FOR NEXT SESSION: Continue to progress Bil shoulder mobility/AROM, strengthen scapular, rotator cuff and cervical extensor muscles.  Consider a posterior capsule stretch as horizontal adduction AROM is relatively unchanged over the past week.         Farley Ly, PT, MPT 11/06/2021, 9:37 AM

## 2021-11-10 ENCOUNTER — Ambulatory Visit: Payer: Medicare Other | Admitting: Physician Assistant

## 2021-11-11 ENCOUNTER — Ambulatory Visit: Payer: Medicare Other | Admitting: Physical Therapy

## 2021-11-11 ENCOUNTER — Encounter: Payer: Self-pay | Admitting: Physical Therapy

## 2021-11-11 DIAGNOSIS — M25511 Pain in right shoulder: Secondary | ICD-10-CM

## 2021-11-11 DIAGNOSIS — M542 Cervicalgia: Secondary | ICD-10-CM

## 2021-11-11 DIAGNOSIS — G8929 Other chronic pain: Secondary | ICD-10-CM

## 2021-11-11 DIAGNOSIS — M25512 Pain in left shoulder: Secondary | ICD-10-CM | POA: Diagnosis not present

## 2021-11-11 DIAGNOSIS — M6281 Muscle weakness (generalized): Secondary | ICD-10-CM

## 2021-11-11 NOTE — Therapy (Signed)
OUTPATIENT PHYSICAL THERAPY TREATMENT NOTE   Patient Name: Anna [name] MRN: [id number] DOB:20-Jan-1946, 76 y.o., female Today's Date: 11/11/2021  PCP:  [name], MD   REFERRING PROVIDER: [name], MD   END OF SESSION:   PT End of Session - 11/11/21 0840     Visit Number 6    Number of Visits 16    Date for PT Re-Evaluation 12/19/21    Authorization Type UHC/ Medicare    PT Start Time 0805    PT Stop Time 0845    PT Time Calculation (min) 40 min    Activity Tolerance Patient tolerated treatment well;No increased pain    Behavior During Therapy WFL for tasks assessed/performed                 Past Medical History:  Diagnosis Date   Arthritis    Breast cancer [facility])    age 20   Family history of adverse reaction to anesthesia    Son takes longer to wake   Headache    Hyperlipidemia    Osteopenia    Vision abnormalities    Past Surgical History:  Procedure Laterality Date   BACK SURGERY     lumbar region   BUNIONECTOMY Bilateral    MASTECTOMY Left    at age 52   TOTAL HIP ARTHROPLASTY Right 04/22/2021   Procedure: Right TOTAL HIP ARTHROPLASTY ANTERIOR APPROACH;  Surgeon: [name], MD;  Location: [place];  Service: Orthopedics;  Laterality: Right;   TRIGGER FINGER RELEASE Left    left thumb   TUBAL LIGATION     Patient Active Problem List   Diagnosis Date Noted   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[facility]) joint of left thumb 06/10/2021   Digital mucous cyst of finger 06/10/2021   Status post total replacement of right hip 04/22/2021   Mixed hyperlipidemia 02/06/2021   Musculoskeletal chest pain 02/06/2021   Obesity (BMI 30-39.9) 02/06/2021   Unilateral primary osteoarthritis, right hip 11/06/2020   Unilateral primary osteoarthritis, right knee 11/06/2020   Chest pain of uncertain etiology 01/75/1025   Cardiac murmur 08/29/2020   Mixed dyslipidemia 08/29/2020   Arthritis 08/16/2020   Breast cancer ([place]) 08/16/2020    Headache 08/16/2020   Osteopenia 08/16/2020   Vision abnormalities 08/16/2020   Chronic right shoulder pain 03/07/2018   Chronic migraine 08/11/2017    REFERRING DIAG: Chronic left shoulder pain [M25.512, G89.29]  THERAPY DIAG:  Chronic left shoulder pain  Cervicalgia  Muscle weakness (generalized)  Chronic right shoulder pain  Rationale for Evaluation and Treatment Rehabilitation  PERTINENT HISTORY: Arthritis, OA, THA    PRECAUTIONS: none  SUBJECTIVE: Pt arriving with no pain and reports having a good weekend. Pt stating exercises are going well at home.   PAIN:  Are you having pain? None at rest, still reporting pain up to 8/52 with certain activities depending on the use and task.    OBJECTIVE: (objective measures completed at initial evaluation unless otherwise dated)  DIAGNOSTIC FINDINGS:  None    PATIENT SURVEYS:  10/21/21: FOTO 68% (predicted 68%)     COGNITION: Overall cognitive status: Within functional limits for tasks assessed     SENSATION: WFL   POSTURE: rounded shoulders, forward head, and increased lumbar lordosis   PALPATION: 10/21/21: TTP, bilateral upper traps and medial scapular border with tightness noted in mid trap and rhomboids           CERVICAL ROM:    A: active ROM P= passive ROM A/PROM (deg)  10/21/21 sitting AROM 11/11/21 standing  Flexion 20 22  Extension 10 20  Right lateral flexion 12 22  Left lateral flexion 14 14  Right rotation 30 42  Left rotation 28 50   (Blank rows = not tested)   UPPER EXTREMITY ROM:   A=active ROM P= passive Right 10/21/21 supine Left 10/21/21 supine Lt/Rt in degrees 10/30/2021 Lt/Rt in degrees 11/06/2021   Shoulder flexion 150 135 135/140 140/140  Shoulder extension        Shoulder abduction 154 146    Shoulder horizontal adduction     20/30 25/30  Shoulder extension        Shoulder internal rotation 80  shoulder abd 45 deg 68 Shoulder abd 45 deg   20/35 40/45  Shoulder external  rotation 65 Shoulder abd 45 deg 70 Shoulder ab 45 deg 70/80 80/85  Elbow flexion 140      Elbow extension 0       (Blank rows = not tested)   UPPER EXTREMITY MMT:   MMT Right eval Left eval  Shoulder flexion 5/5 4+/5  Shoulder extension 5/5 4+/5  Shoulder abduction      Shoulder adduction      Shoulder extension      Shoulder internal rotation 5/5 4+/5  Shoulder external rotation 5/5 4+/5  Middle trapezius      Lower trapezius       (Blank rows = not tested)       FUNCTIONAL TESTS:  10/21/21: 5 times sit to stand: 17 seconds UE support 11/11/21: 5 times sit to stand: 12 seconds no UE support      TODAY'S TREATMENT:  11/11/21:  TherEx: Pulleys flexion x 2 minutes, abduction x 2 minutes Rows: Level 3 band 2 x 15, holding 3 sec Standing IR level 2 band 2 x 10 holding 3 sec Standing ER level 2 band 2 x 10 holding 3 sec Standing shoulder abd c 2 # x 15  Standing shoulder extension c 2 # bar x 15  Standing AAROM: shoulder flexion c 2 # bar x 10 Standing IR using 1 # bar holding 3 sec x 10 Walking red physioball up the wall with 5 sec holding in flexion x 10  Wall push ups x 10  Wall posture against wall while performing cervical retraction x 15 holding 5 sec    11/06/21: Rows with Blue theraband 20X 3 seconds Cervical retraction/Extension Isometrics 10X 5 seconds Supine shoulder flexion AROM (palms in, protract 1st) 10X 10 seconds Standing cervical rotation and lateral bending/upper trap AROM 10X 5 seconds each (shoulder back, perfect posture) Shoulder blade pinches 10X 5 seconds Thumb up the back stretch (to address tight shoulder IR) 10X 10 seconds Bil Shoulder ER theraband 2 sets of 10 Bil slow eccentrics   11/04/21:  TherEx: UBE Level 2 x  2 minutes each direction Rows: Level 3 band 2 x 15 Standing shoulder abd c 1 # 2 x 10 Standing AAROM: shoulder flexion c 1 # bar x 10 Standing with back against the wall with ball in mid thoracic spine to elicit mild  thoracic extension while performing active shoulder flexion  Supine AAROM shoulder flexion 1 # bar x 15 with towel roll under spine positioned horizontal Supine cervical retraction x 15 holding 5 sec Supine cervical rotation x 10 holding 3 sec       PATIENT EDUCATION:  Education details: FOTO, diagnosis, prognosis, HEP, TPDN, and PT POC Person educated: Patient Education method: Customer service manager, handout issued  about TPDN for future session Education comprehension: verbalized understanding and returned demonstration     HOME EXERCISE PROGRAM: Access Code: [email or web address] URL: [email or web address] Date: 11/06/2021 Prepared by: Vista Mink  Exercises - Supine Shoulder External Rotation in 45 Degrees Abduction AAROM with Dowel  - 2 x daily - 7 x weekly - 15 reps - 3 seconds hold - Supine Shoulder Flexion Extension AAROM with Dowel  - 2-3 x daily - 7 x weekly - 10 reps - 10 seconds hold - Standing Shoulder Row with Anchored Resistance  - 1 x daily - 7 x weekly - 1 sets - 20 reps - 3 seconds hold - Supine Cervical Retraction with Towel  - 2 x daily - 7 x weekly - 10 reps - 5 seconds hold - Seated Upper Trap Stretch  - 2 x daily - 1-7 x weekly - 5 reps - 10 seconds hold - Seated Assisted Cervical Rotation with Towel  - 2 x daily - 7 x weekly - 5 reps - 10 seconds hold - Standing Scapular Retraction  - 5 x daily - 7 x weekly - 1 sets - 5 reps - 5 second hold - Standing Shoulder Internal Rotation Stretch with Hands Behind Back  - 3-5 x daily - 7 x weekly - 1 sets - 5 reps - 10 seconds hold - Shoulder External Rotation with Anchored Resistance  - 1 x daily - 7 x weekly - 2 sets - 10 reps - 3 hold   ASSESSMENT:   CLINICAL IMPRESSION:  Pt arriving today with no pain at rest. Pt reporting her sleeping has improved and states once she is able to fall asleep she is no longer waking up at night due to pain. Pt still reporting some difficulty getting comfortable when  initially lying down. Pt tolerating exercises with reports of increased to 2/10. Pt has improved her overall cervical AROM. She is still progressing with left lateral flexion and bilateral cervical rotation. Continue with skilled PT to maximize pt's functional ability.    OBJECTIVE IMPAIRMENTS: decreased activity tolerance, decreased shoulder mobility, decreased ROM, decreased strength, impaired flexibility, impaired UE use, postural dysfunction, and pain.   ACTIVITY LIMITATIONS: reaching, lifting, carry,  cleaning, driving, and or occupation   PERSONAL FACTORS:  also affecting patient's functional outcome.   REHAB POTENTIAL: Good   CLINICAL DECISION MAKING: Stable/uncomplicated   EVALUATION COMPLEXITY: Low       GOALS: Short term PT Goals Target date: 11/18/2021 Pt will be I and compliant with HEP. Baseline:  Goal status: MET 11/11/2021   Long term PT goals Target date: 12/19/2021 Pt will improve cervical AROM to >/= 60 degrees of cervical rotation  to improve functional mobility Baseline: Goal status: On-going 11/11/21   Pt will improve shoulder strength to 5/5 MMT to improve functional strength Baseline: Goal status: New   Pt will improve FOTO to > 68 % functional to show improved function Baseline: Goal status: New   Pt will reduce pain to overall less than 3/10 with usual activity and work activity. Baseline: Goal status: On Going 11/11/2021   PLAN: PT FREQUENCY: 2x/ week    PT DURATION: 8 weeks   PLANNED INTERVENTIONS (unless contraindicated): aquatic PT, Canalith repositioning, cryotherapy, Electrical stimulation, Iontophoresis with 4 mg/ml dexamethasome, Moist heat, traction, Ultrasound, gait training, Therapeutic exercise, balance training, neuromuscular re-education, patient/family education, prosthetic training, manual techniques, passive ROM, dry needling, taping, vasopnuematic device, vestibular, spinal manipulations, joint manipulations   PLAN FOR NEXT  SESSION:  shoulder mobility/AROM, strengthen scapular,  rotator cuff, post capsule stretching          Oretha Caprice, PT, MPT 11/11/2021, 8:49 AM

## 2021-11-13 ENCOUNTER — Ambulatory Visit: Payer: Medicare Other | Admitting: Rehabilitative and Restorative Service Providers"

## 2021-11-13 ENCOUNTER — Encounter: Payer: Self-pay | Admitting: Rehabilitative and Restorative Service Providers"

## 2021-11-13 DIAGNOSIS — G8929 Other chronic pain: Secondary | ICD-10-CM | POA: Diagnosis not present

## 2021-11-13 DIAGNOSIS — M25512 Pain in left shoulder: Secondary | ICD-10-CM | POA: Diagnosis not present

## 2021-11-13 DIAGNOSIS — M25511 Pain in right shoulder: Secondary | ICD-10-CM | POA: Diagnosis not present

## 2021-11-13 DIAGNOSIS — M542 Cervicalgia: Secondary | ICD-10-CM | POA: Diagnosis not present

## 2021-11-13 DIAGNOSIS — M6281 Muscle weakness (generalized): Secondary | ICD-10-CM

## 2021-11-13 NOTE — Therapy (Signed)
OUTPATIENT PHYSICAL THERAPY TREATMENT/PROGRESS NOTE   Patient Name: Anna [name] MRN: [id number] DOB:[birth year], 76 y.o., female Today's Date: 11/13/2021  PCP:  [name], MD   REFERRING PROVIDER: [name], MD   Progress Note Reporting Period 10/21/2021 to 11/13/2021  See note below for Objective Data and Assessment of Progress/Goals.      END OF SESSION:   PT End of Session - 11/13/21 0844     Visit Number 7    Number of Visits 16    Date for PT Re-Evaluation 12/19/21    Authorization Type UHC/ Medicare    Progress Note Due on Visit 17    PT Start Time 0843    PT Stop Time 0929    PT Time Calculation (min) 46 min    Activity Tolerance Patient tolerated treatment well;No increased pain    Behavior During Therapy WFL for tasks assessed/performed              Past Medical History:  Diagnosis Date   Arthritis    Breast cancer [facility])    age 89   Family history of adverse reaction to anesthesia    Son takes longer to wake   Headache    Hyperlipidemia    Osteopenia    Vision abnormalities    Past Surgical History:  Procedure Laterality Date   BACK SURGERY     lumbar region   BUNIONECTOMY Bilateral    MASTECTOMY Left    at age 16   TOTAL HIP ARTHROPLASTY Right 04/22/2021   Procedure: Right TOTAL HIP ARTHROPLASTY ANTERIOR APPROACH;  Surgeon: [name], MD;  Location: [place];  Service: Orthopedics;  Laterality: Right;   TRIGGER FINGER RELEASE Left    left thumb   TUBAL LIGATION     Patient Active Problem List   Diagnosis Date Noted   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[facility]) joint of left thumb 06/10/2021   Digital mucous cyst of finger 06/10/2021   Status post total replacement of right hip 04/22/2021   Mixed hyperlipidemia 02/06/2021   Musculoskeletal chest pain 02/06/2021   Obesity (BMI 30-39.9) 02/06/2021   Unilateral primary osteoarthritis, right hip 11/06/2020   Unilateral primary osteoarthritis, right knee 11/06/2020    Chest pain of uncertain etiology 60/60/0459   Cardiac murmur 08/29/2020   Mixed dyslipidemia 08/29/2020   Arthritis 08/16/2020   Breast cancer ([place]) 08/16/2020   Headache 08/16/2020   Osteopenia 08/16/2020   Vision abnormalities 08/16/2020   Chronic right shoulder pain 03/07/2018   Chronic migraine 08/11/2017    REFERRING DIAG: Chronic left shoulder pain [M25.512, G89.29]  THERAPY DIAG:  Chronic left shoulder pain  Chronic right shoulder pain  Cervicalgia  Muscle weakness (generalized)  Rationale for Evaluation and Treatment Rehabilitation  PERTINENT HISTORY: Arthritis, OA, THA    PRECAUTIONS: none  SUBJECTIVE: Marquasha reports continued good HEP compliance.  She notes her shoulders have improved, especially at night.  Her neck still affects function, particularly with prolonged postures.  PAIN:  Are you having pain? Neck 2-4/10 and shoulders 2-3/10 this week.   OBJECTIVE: (objective measures completed at initial evaluation unless otherwise dated)  DIAGNOSTIC FINDINGS:  None    PATIENT SURVEYS:  11/13/2021: FOTO 72 10/21/21: FOTO 68% (predicted 68%)     COGNITION: Overall cognitive status: Within functional limits for tasks assessed     SENSATION: WFL   POSTURE: Rounded shoulders, forward head, and increased lumbar lordosis.  Improves when cued.   PALPATION: 10/21/21: TTP, bilateral upper traps and medial scapular border with  tightness noted in mid trap and rhomboids           CERVICAL ROM:    A: active ROM P= passive ROM A/PROM (deg) 10/21/21 sitting AROM 11/11/21 standing  Flexion 20 22  Extension 10 20  Right lateral flexion 12 22  Left lateral flexion 14 14  Right rotation 30 42  Left rotation 28 50   (Blank rows = not tested)   UPPER EXTREMITY ROM:   A=active ROM P= passive Right 10/21/21 supine Left 10/21/21 supine Lt/Rt in degrees 10/30/2021 Lt/Rt in degrees 11/06/2021  Lt/Rt in degrees supine 11/13/2021  Shoulder flexion 150 135 135/140  140/140 140/140  Shoulder extension         Shoulder abduction 154 146     Shoulder horizontal adduction     '20/30 25/30 45/50 '  Shoulder extension         Shoulder internal rotation 80  shoulder abd 45 deg 68 Shoulder abd 45 deg   '20/35 40/45 35/40 '  Shoulder external rotation 65 Shoulder abd 45 deg 70 Shoulder ab 45 deg '70/80 80/85 85/90 '  Elbow flexion 140       Elbow extension 0        (Blank rows = not tested)   UPPER EXTREMITY MMT:   MMT Right eval Left eval Lt/Rt in pounds 11/13/2021  Shoulder flexion 5/5 4+/5   Shoulder extension 5/5 4+/5   Shoulder abduction       Shoulder adduction       Shoulder extension       Shoulder internal rotation 5/5 4+/5 25.1/22.6  Shoulder external rotation 5/5 4+/5 14.9/17.8  Cervical lateral bending     8.4/7.3  Cervical extension     18.4   (Blank rows = not tested)       FUNCTIONAL TESTS:  10/21/21: 5 times sit to stand: 17 seconds UE support 11/11/21: 5 times sit to stand: 12 seconds no UE support      TODAY'S TREATMENT:  11/13/2021  Pull to chest/Rows Blue theraband 20x  Shoulder ER theraband Red 2 sets of 10 B slow eccentrics   Cervical rotation and lateral bending AROM 10X 5 seconds each Cervical retraction/Extension Isometrics 10X 5 seconds Supine shoulder flexion AROM (palms in, protract 1st) 10X 10 seconds Shoulder blade pinches 10X 5 seconds Thumb up the back stretch (to address tight shoulder IR) 5X 10 seconds Bil   11/11/21:  TherEx: Pulleys flexion x 2 minutes, abduction x 2 minutes Rows: Level 3 band 2 x 15, holding 3 sec Standing IR level 2 band 2 x 10 holding 3 sec Standing ER level 2 band 2 x 10 holding 3 sec Standing shoulder abd c 2 # x 15  Standing shoulder extension c 2 # bar x 15  Standing AAROM: shoulder flexion c 2 # bar x 10 Standing IR using 1 # bar holding 3 sec x 10 Walking red physioball up the wall with 5 sec holding in flexion x 10  Wall push ups x 10  Wall posture against wall while  performing cervical retraction x 15 holding 5 sec   11/06/21: Rows with Blue theraband 20X 3 seconds Cervical retraction/Extension Isometrics 10X 5 seconds Supine shoulder flexion AROM (palms in, protract 1st) 10X 10 seconds Standing cervical rotation and lateral bending/upper trap AROM 10X 5 seconds each (shoulder back, perfect posture) Shoulder blade pinches 10X 5 seconds Thumb up the back stretch (to address tight shoulder IR) 10X 10 seconds Bil Shoulder ER theraband 2 sets of  10 Bil slow eccentrics    PATIENT EDUCATION:  Education details: FOTO, diagnosis, prognosis, HEP, TPDN, and PT POC Person educated: Patient Education method: Customer service manager, handout issued about TPDN for future session Education comprehension: verbalized understanding and returned demonstration     HOME EXERCISE PROGRAM: Access Code: [email or web address] URL: [email or web address] Date: 11/13/2021 Prepared by: Vista Mink  Exercises - Supine Shoulder External Rotation in 45 Degrees Abduction AAROM with Dowel  - 2 x daily - 1 x weekly - 15 reps - 3 seconds hold - Supine Shoulder Flexion Extension AAROM with Dowel  - 2-3 x daily - 7 x weekly - 10 reps - 10 seconds hold - Standing Shoulder Row with Anchored Resistance  - 1 x daily - 7 x weekly - 1 sets - 20 reps - 3 seconds hold - Supine Cervical Retraction with Towel  - 5 x daily - 7 x weekly - 5 reps - 5 seconds hold - Seated Upper Trap Stretch  - 2 x daily - 1-7 x weekly - 5 reps - 10 seconds hold - Seated Assisted Cervical Rotation with Towel  - 2 x daily - 7 x weekly - 5 reps - 10 seconds hold - Standing Scapular Retraction  - 5 x daily - 7 x weekly - 1 sets - 5 reps - 5 second hold - Standing Shoulder Internal Rotation Stretch with Hands Behind Back  - 2-3 x daily - 7 x weekly - 1 sets - 5 reps - 10 seconds hold - Shoulder External Rotation with Anchored Resistance  - 1 x daily - 7 x weekly - 2 sets - 10 reps - 3 hold   ASSESSMENT:    CLINICAL IMPRESSION:  Frimy notes overall progress with her pain, neck and shoulder AROM and strength and self-reported function since starting supervised physical therapy.  Objective cervical and shoulder AROM and strength impairments are being addressed with her home and clinic exercise programs.  Her prognosis to continue to improve her pain and self-reported function remains good with her current POC.  I anticipate DC to independent PT by 12/19/2021 as estimated at evaluation.   OBJECTIVE IMPAIRMENTS: decreased activity tolerance, decreased shoulder mobility, decreased ROM, decreased strength, impaired flexibility, impaired UE use, postural dysfunction, and pain.   ACTIVITY LIMITATIONS: reaching, lifting, carry,  cleaning, driving, and or occupation   PERSONAL FACTORS:  also affecting patient's functional outcome.   REHAB POTENTIAL: Good   CLINICAL DECISION MAKING: Stable/uncomplicated   EVALUATION COMPLEXITY: Low       GOALS: Short term PT Goals Target date: 11/18/2021 Pt will be I and compliant with HEP. Baseline:  Goal status: MET 11/11/2021   Long term PT goals Target date: 12/19/2021 Pt will improve cervical AROM to >/= 60 degrees of cervical rotation  to improve functional mobility Baseline: Goal status: On-going 11/11/21   Pt will improve shoulder strength to 5/5 MMT to improve functional strength Baseline: Goal status: On Going 11/13/2021   Pt will improve FOTO to > 68 % functional to show improved function Baseline: Goal status: Met 11/13/2021   Pt will reduce pain to overall less than 3/10 with usual activity and work activity. Baseline: Goal status: On Going 11/13/2021   PLAN: PT FREQUENCY: 2x/ week    PT DURATION: 8 weeks   PLANNED INTERVENTIONS (unless contraindicated): aquatic PT, Canalith repositioning, cryotherapy, Electrical stimulation, Iontophoresis with 4 mg/ml dexamethasome, Moist heat, traction, Ultrasound, gait training, Therapeutic exercise,  balance training, neuromuscular re-education, patient/family education, prosthetic training, manual techniques, passive ROM,  dry needling, taping, vasopnuematic device, vestibular, spinal manipulations, joint manipulations   PLAN FOR NEXT SESSION: Address remaining cervical and shoulder AROM and strength impairments (see objective).  Prepare for independent PT in the next ~ 4 weeks as pain and self-reported function progress.    Farley Ly, PT, MPT 11/13/2021, 10:21 AM

## 2021-11-14 DIAGNOSIS — H9202 Otalgia, left ear: Secondary | ICD-10-CM | POA: Diagnosis not present

## 2021-11-17 DIAGNOSIS — Z0000 Encounter for general adult medical examination without abnormal findings: Secondary | ICD-10-CM | POA: Diagnosis not present

## 2021-11-17 DIAGNOSIS — E559 Vitamin D deficiency, unspecified: Secondary | ICD-10-CM | POA: Diagnosis not present

## 2021-11-18 ENCOUNTER — Encounter: Payer: Self-pay | Admitting: Physical Therapy

## 2021-11-18 ENCOUNTER — Ambulatory Visit: Payer: Medicare Other | Admitting: Physical Therapy

## 2021-11-18 DIAGNOSIS — M6281 Muscle weakness (generalized): Secondary | ICD-10-CM | POA: Diagnosis not present

## 2021-11-18 DIAGNOSIS — M25512 Pain in left shoulder: Secondary | ICD-10-CM | POA: Diagnosis not present

## 2021-11-18 DIAGNOSIS — M542 Cervicalgia: Secondary | ICD-10-CM | POA: Diagnosis not present

## 2021-11-18 DIAGNOSIS — M25511 Pain in right shoulder: Secondary | ICD-10-CM | POA: Diagnosis not present

## 2021-11-18 DIAGNOSIS — G8929 Other chronic pain: Secondary | ICD-10-CM | POA: Diagnosis not present

## 2021-11-18 NOTE — Therapy (Signed)
OUTPATIENT PHYSICAL THERAPY TREATMENT/PROGRESS NOTE   Patient Name: Anna [name] MRN: [id number] DOB:[birth year], 76 y.o., female Today's Date: 11/18/2021  PCP:  [name], MD   REFERRING PROVIDER: [name], MD        END OF SESSION:   PT End of Session - 11/18/21 0917     Visit Number 8    Number of Visits 16    Date for PT Re-Evaluation 12/19/21    Authorization Type UHC/ Medicare    Progress Note Due on Visit 17    PT Start Time 0845    PT Stop Time 0925    PT Time Calculation (min) 40 min    Activity Tolerance Patient tolerated treatment well;No increased pain    Behavior During Therapy WFL for tasks assessed/performed               Past Medical History:  Diagnosis Date   Arthritis    Breast cancer [facility])    age 44   Family history of adverse reaction to anesthesia    Son takes longer to wake   Headache    Hyperlipidemia    Osteopenia    Vision abnormalities    Past Surgical History:  Procedure Laterality Date   BACK SURGERY     lumbar region   BUNIONECTOMY Bilateral    MASTECTOMY Left    at age 40   TOTAL HIP ARTHROPLASTY Right 04/22/2021   Procedure: Right TOTAL HIP ARTHROPLASTY ANTERIOR APPROACH;  Surgeon: [name], MD;  Location: [place];  Service: Orthopedics;  Laterality: Right;   TRIGGER FINGER RELEASE Left    left thumb   TUBAL LIGATION     Patient Active Problem List   Diagnosis Date Noted   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[facility]) joint of left thumb 06/10/2021   Digital mucous cyst of finger 06/10/2021   Status post total replacement of right hip 04/22/2021   Mixed hyperlipidemia 02/06/2021   Musculoskeletal chest pain 02/06/2021   Obesity (BMI 30-39.9) 02/06/2021   Unilateral primary osteoarthritis, right hip 11/06/2020   Unilateral primary osteoarthritis, right knee 11/06/2020   Chest pain of uncertain etiology 49/70/2637   Cardiac murmur 08/29/2020   Mixed dyslipidemia 08/29/2020   Arthritis  08/16/2020   Breast cancer ([place]) 08/16/2020   Headache 08/16/2020   Osteopenia 08/16/2020   Vision abnormalities 08/16/2020   Chronic right shoulder pain 03/07/2018   Chronic migraine 08/11/2017    REFERRING DIAG: Chronic left shoulder pain [M25.512, G89.29]  THERAPY DIAG:  Chronic left shoulder pain  Chronic right shoulder pain  Cervicalgia  Muscle weakness (generalized)  Rationale for Evaluation and Treatment Rehabilitation  PERTINENT HISTORY: Arthritis, OA, THA    PRECAUTIONS: none  SUBJECTIVE: Elesia reports continued good HEP compliance.  She notes her shoulders have improved, especially at night.  Her neck still affects function, particularly with prolonged postures.  PAIN:  Are you having pain? Neck 2-4/10 and shoulders 2-3/10 this week.   OBJECTIVE: (objective measures completed at initial evaluation unless otherwise dated)  DIAGNOSTIC FINDINGS:  None    PATIENT SURVEYS:  11/13/2021: FOTO 72 10/21/21: FOTO 68% (predicted 68%)     COGNITION: Overall cognitive status: Within functional limits for tasks assessed     SENSATION: WFL   POSTURE: Rounded shoulders, forward head, and increased lumbar lordosis.  Improves when cued.   PALPATION: 10/21/21: TTP, bilateral upper traps and medial scapular border with tightness noted in mid trap and rhomboids  CERVICAL ROM:    A: active ROM P= passive ROM A/PROM (deg) 10/21/21 sitting AROM 11/11/21 standing  Flexion 20 22  Extension 10 20  Right lateral flexion 12 22  Left lateral flexion 14 14  Right rotation 30 42  Left rotation 28 50   (Blank rows = not tested)   UPPER EXTREMITY ROM:   A=active ROM P= passive Right 10/21/21 supine Left 10/21/21 supine Lt/Rt in degrees 10/30/2021 Lt/Rt in degrees 11/06/2021  Lt/Rt in degrees supine 11/13/2021  Shoulder flexion 150 135 135/140 140/140 140/140  Shoulder extension         Shoulder abduction 154 146     Shoulder horizontal adduction     20/30  25/30 45/50  Shoulder extension         Shoulder internal rotation 80  shoulder abd 45 deg 68 Shoulder abd 45 deg   '20/35 40/45 35/40 '  Shoulder external rotation 65 Shoulder abd 45 deg 70 Shoulder ab 45 deg '70/80 80/85 85/90 '  Elbow flexion 140       Elbow extension 0        (Blank rows = not tested)   UPPER EXTREMITY MMT:   MMT Right eval Left eval Lt/Rt in pounds 11/13/2021  Shoulder flexion 5/5 4+/5   Shoulder extension 5/5 4+/5   Shoulder abduction       Shoulder adduction       Shoulder extension       Shoulder internal rotation 5/5 4+/5 25.1/22.6  Shoulder external rotation 5/5 4+/5 14.9/17.8  Cervical lateral bending     8.4/7.3  Cervical extension     18.4   (Blank rows = not tested)       FUNCTIONAL TESTS:  10/21/21: 5 times sit to stand: 17 seconds UE support 11/11/21: 5 times sit to stand: 12 seconds no UE support      TODAY'S TREATMENT:  11/18/21:  TherEx: UBE Level 2 3 minutes each direction BATCA rows 15# 2 x 10 BATCA: chest press 10# 2 x 10 BATCA: shoulder extension x 10 5# bil UE Standing shoulder extension c 2 # bar x 15  Standing AAROM: shoulder flexion c 2 # bar x 10 Door stretch: x 3 holding 20 seconds Manual: STM to bilateral upper traps, cervical paraspinals, levator and rhomboids using Biofreeze.  Trigger Point Dry-Needling  Treatment instructions: Expect mild to moderate muscle soreness. S/S of pneumothorax if dry needled over a lung field, and to seek immediate medical attention should they occur. Patient verbalized understanding of these instructions and education. Patient Consent Given: Yes Education handout provided: Previously provided Muscles treated: bil upper traps Treatment response/outcome: twitch response noted.     11/13/2021  Pull to chest/Rows Blue theraband 20x  Shoulder ER theraband Red 2 sets of 10 B slow eccentrics   Cervical rotation and lateral bending AROM 10X 5 seconds each Cervical retraction/Extension  Isometrics 10X 5 seconds Supine shoulder flexion AROM (palms in, protract 1st) 10X 10 seconds Shoulder blade pinches 10X 5 seconds Thumb up the back stretch (to address tight shoulder IR) 5X 10 seconds Bil   11/11/21:  TherEx: Pulleys flexion x 2 minutes, abduction x 2 minutes Rows: Level 3 band 2 x 15, holding 3 sec Standing IR level 2 band 2 x 10 holding 3 sec Standing ER level 2 band 2 x 10 holding 3 sec Standing shoulder abd c 2 # x 15  Standing shoulder extension c 2 # bar x 15  Standing AAROM: shoulder flexion c 2 #  bar x 10 Standing IR using 1 # bar holding 3 sec x 10 Walking red physioball up the wall with 5 sec holding in flexion x 10  Wall push ups x 10  Wall posture against wall while performing cervical retraction x 15 holding 5 sec   11/06/21: Rows with Blue theraband 20X 3 seconds Cervical retraction/Extension Isometrics 10X 5 seconds Supine shoulder flexion AROM (palms in, protract 1st) 10X 10 seconds Standing cervical rotation and lateral bending/upper trap AROM 10X 5 seconds each (shoulder back, perfect posture) Shoulder blade pinches 10X 5 seconds Thumb up the back stretch (to address tight shoulder IR) 10X 10 seconds Bil Shoulder ER theraband 2 sets of 10 Bil slow eccentrics    PATIENT EDUCATION:  Education details: FOTO, diagnosis, prognosis, HEP, TPDN, and PT POC Person educated: Patient Education method: Explanation and Demonstration, handout issued about TPDN for future session Education comprehension: verbalized understanding and returned demonstration     HOME EXERCISE PROGRAM: Access Code: [email or web address] URL: [email or web address] Date: 11/13/2021 Prepared by: Vista Mink  Exercises - Supine Shoulder External Rotation in 45 Degrees Abduction AAROM with Dowel  - 2 x daily - 1 x weekly - 15 reps - 3 seconds hold - Supine Shoulder Flexion Extension AAROM with Dowel  - 2-3 x daily - 7 x weekly - 10 reps - 10 seconds hold - Standing  Shoulder Row with Anchored Resistance  - 1 x daily - 7 x weekly - 1 sets - 20 reps - 3 seconds hold - Supine Cervical Retraction with Towel  - 5 x daily - 7 x weekly - 5 reps - 5 seconds hold - Seated Upper Trap Stretch  - 2 x daily - 1-7 x weekly - 5 reps - 10 seconds hold - Seated Assisted Cervical Rotation with Towel  - 2 x daily - 7 x weekly - 5 reps - 10 seconds hold - Standing Scapular Retraction  - 5 x daily - 7 x weekly - 1 sets - 5 reps - 5 second hold - Standing Shoulder Internal Rotation Stretch with Hands Behind Back  - 2-3 x daily - 7 x weekly - 1 sets - 5 reps - 10 seconds hold - Shoulder External Rotation with Anchored Resistance  - 1 x daily - 7 x weekly - 2 sets - 10 reps - 3 hold   ASSESSMENT:   CLINICAL IMPRESSION:  Edit notes overall progress with her pain, neck and shoulder AROM and strength and self-reported function since starting supervised physical therapy.  Objective cervical and shoulder AROM and strength impairments are being addressed with her home and clinic exercise programs.  Her prognosis to continue to improve her pain and self-reported function remains good with her current POC.  I anticipate DC to independent PT by 12/19/2021 as estimated at evaluation.   OBJECTIVE IMPAIRMENTS: decreased activity tolerance, decreased shoulder mobility, decreased ROM, decreased strength, impaired flexibility, impaired UE use, postural dysfunction, and pain.   ACTIVITY LIMITATIONS: reaching, lifting, carry,  cleaning, driving, and or occupation   PERSONAL FACTORS:  also affecting patient's functional outcome.   REHAB POTENTIAL: Good   CLINICAL DECISION MAKING: Stable/uncomplicated   EVALUATION COMPLEXITY: Low       GOALS: Short term PT Goals Target date: 11/18/2021 Pt will be I and compliant with HEP. Baseline:  Goal status: MET 11/11/2021   Long term PT goals Target date: 12/19/2021 Pt will improve cervical AROM to >/= 60 degrees of cervical rotation  to improve  functional mobility Baseline: Goal status:  On-going 11/11/21   Pt will improve shoulder strength to 5/5 MMT to improve functional strength Baseline: Goal status: On Going 11/13/2021   Pt will improve FOTO to > 68 % functional to show improved function Baseline: Goal status: Met 11/13/2021   Pt will reduce pain to overall less than 3/10 with usual activity and work activity. Baseline: Goal status: On Going 11/18/2021   PLAN: PT FREQUENCY: 2x/ week    PT DURATION: 8 weeks   PLANNED INTERVENTIONS (unless contraindicated): aquatic PT, Canalith repositioning, cryotherapy, Electrical stimulation, Iontophoresis with 4 mg/ml dexamethasome, Moist heat, traction, Ultrasound, gait training, Therapeutic exercise, balance training, neuromuscular re-education, patient/family education, prosthetic training, manual techniques, passive ROM, dry needling, taping, vasopnuematic device, vestibular, spinal manipulations, joint manipulations   PLAN FOR NEXT SESSION: Address remaining cervical and shoulder AROM and strength impairments, manual therapy and TPDN as needed for pain and muscle tightness.     Oretha Caprice, PT, MPT 11/18/2021, 9:30 AM

## 2021-11-20 ENCOUNTER — Encounter: Payer: Self-pay | Admitting: Rehabilitative and Restorative Service Providers"

## 2021-11-20 ENCOUNTER — Ambulatory Visit: Payer: Medicare Other | Admitting: Rehabilitative and Restorative Service Providers"

## 2021-11-20 DIAGNOSIS — M25512 Pain in left shoulder: Secondary | ICD-10-CM | POA: Diagnosis not present

## 2021-11-20 DIAGNOSIS — M6281 Muscle weakness (generalized): Secondary | ICD-10-CM

## 2021-11-20 DIAGNOSIS — G8929 Other chronic pain: Secondary | ICD-10-CM | POA: Diagnosis not present

## 2021-11-20 DIAGNOSIS — M25511 Pain in right shoulder: Secondary | ICD-10-CM | POA: Diagnosis not present

## 2021-11-20 DIAGNOSIS — M542 Cervicalgia: Secondary | ICD-10-CM | POA: Diagnosis not present

## 2021-11-20 NOTE — Therapy (Signed)
OUTPATIENT PHYSICAL THERAPY TREATMENT NOTE   Patient Name: Anna [name] MRN: [id number] DOB:1945-03-02, 76 y.o., female Today's Date: 11/20/2021  PCP:  Anna Pretty, MD   REFERRING PROVIDER: [name], MD   END OF SESSION:   PT End of Session - 11/20/21 1350     Visit Number 9    Number of Visits 16    Date for PT Re-Evaluation 12/19/21    Authorization Type UHC/ Medicare    Progress Note Due on Visit 17    PT Start Time 0845    PT Stop Time 0929    PT Time Calculation (min) 44 min    Activity Tolerance Patient tolerated treatment well;No increased pain    Behavior During Therapy WFL for tasks assessed/performed                Past Medical History:  Diagnosis Date   Arthritis    Breast cancer [facility])    age 89   Family history of adverse reaction to anesthesia    Son takes longer to wake   Headache    Hyperlipidemia    Osteopenia    Vision abnormalities    Past Surgical History:  Procedure Laterality Date   BACK SURGERY     lumbar region   BUNIONECTOMY Bilateral    MASTECTOMY Left    at age 81   TOTAL HIP ARTHROPLASTY Right 04/22/2021   Procedure: Right TOTAL HIP ARTHROPLASTY ANTERIOR APPROACH;  Surgeon: Anna Rossetti, MD;  Location: [place];  Service: Orthopedics;  Laterality: Right;   TRIGGER FINGER RELEASE Left    left thumb   TUBAL LIGATION     Patient Active Problem List   Diagnosis Date Noted   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[facility]) joint of left thumb 06/10/2021   Digital mucous cyst of finger 06/10/2021   Status post total replacement of right hip 04/22/2021   Mixed hyperlipidemia 02/06/2021   Musculoskeletal chest pain 02/06/2021   Obesity (BMI 30-39.9) 02/06/2021   Unilateral primary osteoarthritis, right hip 11/06/2020   Unilateral primary osteoarthritis, right knee 11/06/2020   Chest pain of uncertain etiology 62/83/6629   Cardiac murmur 08/29/2020   Mixed dyslipidemia 08/29/2020   Arthritis 08/16/2020   Breast  cancer ([place]) 08/16/2020   Headache 08/16/2020   Osteopenia 08/16/2020   Vision abnormalities 08/16/2020   Chronic right shoulder pain 03/07/2018   Chronic migraine 08/11/2017    REFERRING DIAG: Chronic left shoulder pain [M25.512, G89.29]  THERAPY DIAG:  Chronic left shoulder pain  Chronic right shoulder pain  Cervicalgia  Muscle weakness (generalized)  Rationale for Evaluation and Treatment Rehabilitation  PERTINENT HISTORY: Arthritis, OA, THA    PRECAUTIONS: none  SUBJECTIVE: Anna [name] reports continued HEP compliance.  Shoulders have improved, especially at night.  Her neck, although improved, still affects function, particularly with prolonged postures.  PAIN:  Are you having pain? Neck 0-4/10 and shoulders 2-4/10 this week.   OBJECTIVE: (objective measures completed at initial evaluation unless otherwise dated)  DIAGNOSTIC FINDINGS:  None    PATIENT SURVEYS:  11/13/2021: FOTO 72 10/21/21: FOTO 68% (predicted 68%)     COGNITION: Overall cognitive status: Within functional limits for tasks assessed     SENSATION: WFL   POSTURE: Rounded shoulders, forward head, and increased lumbar lordosis.  Improves when cued.   PALPATION: 10/21/21: TTP, bilateral upper traps and medial scapular border with tightness noted in mid trap and rhomboids           CERVICAL ROM:    A:  active ROM P= passive ROM A/PROM (deg) 10/21/21 sitting AROM 11/11/21 standing  Flexion 20 22  Extension 10 20  Right lateral flexion 12 22  Left lateral flexion 14 14  Right rotation 30 42  Left rotation 28 50   (Blank rows = not tested)   UPPER EXTREMITY ROM:   A=active ROM P= passive Right 10/21/21 supine Left 10/21/21 supine Lt/Rt in degrees 10/30/2021 Lt/Rt in degrees 11/06/2021  Lt/Rt in degrees supine 11/13/2021 Lt/Rt degrees supine 11/20/2021  Shoulder flexion 150 135 135/140 140/140 140/140 145/140  Shoulder extension          Shoulder abduction 154 146      Shoulder  horizontal adduction     '20/30 25/30 45/50 ' 55/50  Shoulder extension          Shoulder internal rotation 80  shoulder abd 45 deg 68 Shoulder abd 45 deg   '20/35 40/45 35/40 ' 35/30  Shoulder external rotation 65 Shoulder abd 45 deg 70 Shoulder ab 45 deg '70/80 80/85 85/90 ' 80/90  Elbow flexion 140        Elbow extension 0         (Blank rows = not tested)   UPPER EXTREMITY MMT:   MMT Right eval Left eval Lt/Rt in pounds 11/13/2021 Lt/Rt in pounds 11/20/2021  Shoulder flexion 5/5 4+/5    Shoulder extension 5/5 4+/5    Shoulder abduction        Shoulder adduction        Shoulder extension        Shoulder internal rotation 5/5 4+/5 25.1/22.6 24.3/24.2  Shoulder external rotation 5/5 4+/5 14.9/17.8 17.9/17.7  Cervical lateral bending     8.4/7.3 21.6/17.7  Cervical extension     18.4 33.7   (Blank rows = not tested)       FUNCTIONAL TESTS:  10/21/21: 5 times sit to stand: 17 seconds UE support 11/11/21: 5 times sit to stand: 12 seconds no UE support      TODAY'S TREATMENT:  11/20/2021: Pull to chest Blue 20X (Rows) Shoulder ER theraband Red 2 sets of 10 B slow eccentrics  Shoulder extension Palms up Red theraband 10X 3 seconds   Cervical rotation and lateral bending AROM 10X 5 seconds each Cervical retraction/Extension Isometrics 10X 5 seconds Cervical lateral bending Isometrics 10X 5 seconds Bil Supine shoulder flexion AROM (palms in, protract 1st) 10X 10 seconds Shoulder blade pinches 10X 5 seconds (HEP) Thumb up the back stretch (to address tight shoulder IR) 5X 10 seconds Bil   11/18/21:  TherEx: UBE Level 2 3 minutes each direction BATCA rows 15# 2 x 10 BATCA: chest press 10# 2 x 10 BATCA: shoulder extension x 10 5# bil UE Standing shoulder extension c 2 # bar x 15  Standing AAROM: shoulder flexion c 2 # bar x 10 Door stretch: x 3 holding 20 seconds Manual: STM to bilateral upper traps, cervical paraspinals, levator and rhomboids using Biofreeze.  Trigger  Point Dry-Needling  Treatment instructions: Expect mild to moderate muscle soreness. S/S of pneumothorax if dry needled over a lung field, and to seek immediate medical attention should they occur. Patient verbalized understanding of these instructions and education. Patient Consent Given: Yes Education handout provided: Previously provided Muscles treated: bil upper traps Treatment response/outcome: twitch response noted.    11/13/2021  Pull to chest/Rows Blue theraband 20x  Shoulder ER theraband Red 2 sets of 10 B slow eccentrics   Cervical rotation and lateral bending AROM 10X 5 seconds each Cervical retraction/Extension  Isometrics 10X 5 seconds Supine shoulder flexion AROM (palms in, protract 1st) 10X 10 seconds Shoulder blade pinches 10X 5 seconds Thumb up the back stretch (to address tight shoulder IR) 5X 10 seconds Bil     PATIENT EDUCATION:  Education details: FOTO, diagnosis, prognosis, HEP, TPDN, and PT POC Person educated: Patient Education method: Explanation and Demonstration, handout issued about TPDN for future session Education comprehension: verbalized understanding and returned demonstration     HOME EXERCISE PROGRAM: Access Code: [email or web address] URL: [email or web address] Date: 11/20/2021 Prepared by: Vista Mink  Exercises - Supine Shoulder External Rotation in 45 Degrees Abduction AAROM with Dowel  - 2 x daily - 1 x weekly - 15 reps - 3 seconds hold - Supine Shoulder Flexion Extension AAROM with Dowel  - 2 x daily - 7 x weekly - 10 reps - 10 seconds hold - Standing Shoulder Row with Anchored Resistance  - 1 x daily - 7 x weekly - 1 sets - 20 reps - 3 seconds hold - Supine Cervical Retraction with Towel  - 5 x daily - 7 x weekly - 5 reps - 5 seconds hold - Seated Upper Trap Stretch  - 2 x daily - 1-7 x weekly - 5 reps - 10 seconds hold - Seated Assisted Cervical Rotation with Towel  - 2 x daily - 7 x weekly - 5 reps - 10 seconds hold - Standing  Scapular Retraction  - 5 x daily - 7 x weekly - 1 sets - 5 reps - 5 second hold - Standing Shoulder Internal Rotation Stretch with Hands Behind Back  - 2-3 x daily - 7 x weekly - 1 sets - 5 reps - 10 seconds hold - Shoulder External Rotation with Anchored Resistance  - 1 x daily - 7 x weekly - 2 sets - 10 reps - 3 hold - Standing Isometric Cervical Sidebending with Manual Resistance  - 1-2 x daily - 7 x weekly - 1 sets - 5 reps - 5 hold    ASSESSMENT:   CLINICAL IMPRESSION:  Dashanna is making slow but steady progress towards long-term goals established at evaluation.  Cervical and shoulder strength impairments will do the most to benefit pain and function as AROM is doing well with the exception of some impingement limited shoulder flexion.  Cervical AROM will be re-checked along with continued postural, cervical and shoulder strength work to meet long-term goals.   OBJECTIVE IMPAIRMENTS: decreased activity tolerance, decreased shoulder mobility, decreased ROM, decreased strength, impaired flexibility, impaired UE use, postural dysfunction, and pain.   ACTIVITY LIMITATIONS: reaching, lifting, carry,  cleaning, driving, and or occupation   PERSONAL FACTORS:  also affecting patient's functional outcome.   REHAB POTENTIAL: Good   CLINICAL DECISION MAKING: Stable/uncomplicated   EVALUATION COMPLEXITY: Low       GOALS: Short term PT Goals Target date: 11/18/2021 Pt will be I and compliant with HEP. Baseline:  Goal status: MET 11/11/2021   Long term PT goals Target date: 12/19/2021 Pt will improve cervical AROM to >/= 60 degrees of cervical rotation  to improve functional mobility Baseline: Goal status: On-going 11/11/21   Pt will improve shoulder strength to 5/5 MMT to improve functional strength Baseline: Goal status: On Going 11/13/2021   Pt will improve FOTO to > 68 % functional to show improved function Baseline: Goal status: Met 11/13/2021   Pt will reduce pain to overall  less than 3/10 with usual activity and work activity. Baseline: Goal status: On Going 11/20/2021   PLAN:  PT FREQUENCY: 2x/ week    PT DURATION: 8 weeks   PLANNED INTERVENTIONS (unless contraindicated): aquatic PT, Canalith repositioning, cryotherapy, Electrical stimulation, Iontophoresis with 4 mg/ml dexamethasome, Moist heat, traction, Ultrasound, gait training, Therapeutic exercise, balance training, neuromuscular re-education, patient/family education, prosthetic training, manual techniques, passive ROM, dry needling, taping, vasopnuematic device, vestibular, spinal manipulations, joint manipulations   PLAN FOR NEXT SESSION: Address remaining cervical and shoulder AROM and (mostly) strength impairments.  Re-check cervical AROM.  Manual therapy and TPDN as needed for pain and muscle tightness.     Farley Ly, PT, MPT 11/20/2021, 1:55 PM

## 2021-11-24 DIAGNOSIS — Z853 Personal history of malignant neoplasm of breast: Secondary | ICD-10-CM | POA: Diagnosis not present

## 2021-11-24 DIAGNOSIS — D509 Iron deficiency anemia, unspecified: Secondary | ICD-10-CM | POA: Diagnosis not present

## 2021-11-24 DIAGNOSIS — E039 Hypothyroidism, unspecified: Secondary | ICD-10-CM | POA: Diagnosis not present

## 2021-11-24 DIAGNOSIS — Z0000 Encounter for general adult medical examination without abnormal findings: Secondary | ICD-10-CM | POA: Diagnosis not present

## 2021-11-24 DIAGNOSIS — E7800 Pure hypercholesterolemia, unspecified: Secondary | ICD-10-CM | POA: Diagnosis not present

## 2021-11-24 DIAGNOSIS — E559 Vitamin D deficiency, unspecified: Secondary | ICD-10-CM | POA: Diagnosis not present

## 2021-11-24 DIAGNOSIS — Z23 Encounter for immunization: Secondary | ICD-10-CM | POA: Diagnosis not present

## 2021-11-25 ENCOUNTER — Encounter: Payer: Self-pay | Admitting: Physical Therapy

## 2021-11-25 ENCOUNTER — Ambulatory Visit: Payer: Medicare Other | Admitting: Physical Therapy

## 2021-11-25 DIAGNOSIS — M25512 Pain in left shoulder: Secondary | ICD-10-CM | POA: Diagnosis not present

## 2021-11-25 DIAGNOSIS — M25511 Pain in right shoulder: Secondary | ICD-10-CM | POA: Diagnosis not present

## 2021-11-25 DIAGNOSIS — M542 Cervicalgia: Secondary | ICD-10-CM

## 2021-11-25 DIAGNOSIS — M6281 Muscle weakness (generalized): Secondary | ICD-10-CM

## 2021-11-25 DIAGNOSIS — G8929 Other chronic pain: Secondary | ICD-10-CM | POA: Diagnosis not present

## 2021-11-25 NOTE — Therapy (Addendum)
OUTPATIENT PHYSICAL THERAPY TREATMENT NOTE discharge   Patient Name: Anna [name] MRN: [id number] DOB:29-Jun-1945, 76 y.o., female Today's Date: 11/25/2021  PCP:  [name], MD   REFERRING PROVIDER: [name], MD   END OF SESSION:   PT End of Session - 11/25/21 0917     Visit Number 10    Number of Visits 16    Date for PT Re-Evaluation 12/19/21    Authorization Type UHC/ Medicare    Progress Note Due on Visit 17    PT Start Time 0845    PT Stop Time 0925    PT Time Calculation (min) 40 min    Activity Tolerance Patient tolerated treatment well;No increased pain    Behavior During Therapy WFL for tasks assessed/performed                 Past Medical History:  Diagnosis Date   Arthritis    Breast cancer [facility])    age 33   Family history of adverse reaction to anesthesia    Son takes longer to wake   Headache    Hyperlipidemia    Osteopenia    Vision abnormalities    Past Surgical History:  Procedure Laterality Date   BACK SURGERY     lumbar region   BUNIONECTOMY Bilateral    MASTECTOMY Left    at age 68   TOTAL HIP ARTHROPLASTY Right 04/22/2021   Procedure: Right TOTAL HIP ARTHROPLASTY ANTERIOR APPROACH;  Surgeon: [name], MD;  Location: [place];  Service: Orthopedics;  Laterality: Right;   TRIGGER FINGER RELEASE Left    left thumb   TUBAL LIGATION     Patient Active Problem List   Diagnosis Date Noted   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[facility]) joint of left thumb 06/10/2021   Digital mucous cyst of finger 06/10/2021   Status post total replacement of right hip 04/22/2021   Mixed hyperlipidemia 02/06/2021   Musculoskeletal chest pain 02/06/2021   Obesity (BMI 30-39.9) 02/06/2021   Unilateral primary osteoarthritis, right hip 11/06/2020   Unilateral primary osteoarthritis, right knee 11/06/2020   Chest pain of uncertain etiology 63/78/5885   Cardiac murmur 08/29/2020   Mixed dyslipidemia 08/29/2020   Arthritis  08/16/2020   Breast cancer ([place]) 08/16/2020   Headache 08/16/2020   Osteopenia 08/16/2020   Vision abnormalities 08/16/2020   Chronic right shoulder pain 03/07/2018   Chronic migraine 08/11/2017    REFERRING DIAG: Chronic left shoulder pain [M25.512, G89.29]  THERAPY DIAG:  Chronic left shoulder pain  Chronic right shoulder pain  Cervicalgia  Muscle weakness (generalized)  Rationale for Evaluation and Treatment Rehabilitation  PERTINENT HISTORY: Arthritis, OA, THA    PRECAUTIONS: none  SUBJECTIVE: Anjel reports continued HEP compliance.  Shoulders have improved, especially at night.  Her neck, although improved, still affects function, particularly with prolonged postures.  PAIN:  Are you having pain? Neck 0-4/10 and shoulders 2-4/10 this week.   OBJECTIVE: (objective measures completed at initial evaluation unless otherwise dated)  DIAGNOSTIC FINDINGS:  None    PATIENT SURVEYS:  11/13/2021: FOTO 72 10/21/21: FOTO 68% (predicted 68%)     COGNITION: Overall cognitive status: Within functional limits for tasks assessed     SENSATION: WFL   POSTURE: Rounded shoulders, forward head, and increased lumbar lordosis.  Improves when cued.   PALPATION: 10/21/21: TTP, bilateral upper traps and medial scapular border with tightness noted in mid trap and rhomboids           CERVICAL ROM:  A: active ROM P= passive ROM A/PROM (deg) 10/21/21 sitting AROM 11/11/21 standing AROM 11/25/21  Flexion '20 22 22  ' Extension '10 20 26  ' Right lateral flexion '12 22 22  ' Left lateral flexion '14 14 14  ' Right rotation 30 42 60  Left rotation 28 50 56   (Blank rows = not tested)   UPPER EXTREMITY ROM:   A=active ROM P= passive Right 10/21/21 supine Left 10/21/21 supine Lt/Rt in degrees 10/30/2021 Lt/Rt in degrees 11/06/2021  Lt/Rt in degrees supine 11/13/2021 Lt/Rt degrees supine 11/20/2021  Shoulder flexion 150 135 135/140 140/140 140/140 145/140  Shoulder extension           Shoulder abduction 154 146      Shoulder horizontal adduction     '20/30 25/30 45/50 ' 55/50  Shoulder extension          Shoulder internal rotation 80  shoulder abd 45 deg 68 Shoulder abd 45 deg   '20/35 40/45 35/40 ' 35/30  Shoulder external rotation 65 Shoulder abd 45 deg 70 Shoulder ab 45 deg '70/80 80/85 85/90 ' 80/90  Elbow flexion 140        Elbow extension 0         (Blank rows = not tested)   UPPER EXTREMITY MMT:   MMT Right eval Left eval Lt/Rt in pounds 11/13/2021 Lt/Rt in pounds 11/20/2021  Shoulder flexion 5/5 4+/5    Shoulder extension 5/5 4+/5    Shoulder abduction        Shoulder adduction        Shoulder extension        Shoulder internal rotation 5/5 4+/5 25.1/22.6 24.3/24.2  Shoulder external rotation 5/5 4+/5 14.9/17.8 17.9/17.7  Cervical lateral bending     8.4/7.3 21.6/17.7  Cervical extension     18.4 33.7   (Blank rows = not tested)       FUNCTIONAL TESTS:  10/21/21: 5 times sit to stand: 17 seconds UE support 11/11/21: 5 times sit to stand: 12 seconds no UE support      TODAY'S TREATMENT:  11/25/21:  TherEx: Standing Rows: Level 3 band  2 x 15 Cervical side bending x 2  Cervical rotation x 2 Cervical retraction x 5 Mechanical Traction Cervical traction: max pull 12 lbs, min pull 6 lbs x 25 minutes    11/20/2021: Pull to chest Blue 20X (Rows) Shoulder ER theraband Red 2 sets of 10 B slow eccentrics  Shoulder extension Palms up Red theraband 10X 3 seconds   Cervical rotation and lateral bending AROM 10X 5 seconds each Cervical retraction/Extension Isometrics 10X 5 seconds Cervical lateral bending Isometrics 10X 5 seconds Bil Supine shoulder flexion AROM (palms in, protract 1st) 10X 10 seconds Shoulder blade pinches 10X 5 seconds (HEP) Thumb up the back stretch (to address tight shoulder IR) 5X 10 seconds Bil   11/18/21:  TherEx: UBE Level 2 3 minutes each direction BATCA rows 15# 2 x 10 BATCA: chest press 10# 2 x 10 BATCA: shoulder  extension x 10 5# bil UE Standing shoulder extension c 2 # bar x 15  Standing AAROM: shoulder flexion c 2 # bar x 10 Door stretch: x 3 holding 20 seconds Manual: STM to bilateral upper traps, cervical paraspinals, levator and rhomboids using Biofreeze.  Trigger Point Dry-Needling  Treatment instructions: Expect mild to moderate muscle soreness. S/S of pneumothorax if dry needled over a lung field, and to seek immediate medical attention should they occur. Patient verbalized understanding of these instructions and education. Patient Consent Given:  Yes Education handout provided: Previously provided Muscles treated: bil upper traps Treatment response/outcome: twitch response noted.      PATIENT EDUCATION:  Education details: FOTO, diagnosis, prognosis, HEP, TPDN, and PT POC Person educated: Patient Education method: Customer service manager, handout issued about TPDN for future session Education comprehension: verbalized understanding and returned demonstration     HOME EXERCISE PROGRAM: Access Code: [email or web address] URL: [email or web address] Date: 11/20/2021 Prepared by: Vista Mink  Exercises - Supine Shoulder External Rotation in 45 Degrees Abduction AAROM with Dowel  - 2 x daily - 1 x weekly - 15 reps - 3 seconds hold - Supine Shoulder Flexion Extension AAROM with Dowel  - 2 x daily - 7 x weekly - 10 reps - 10 seconds hold - Standing Shoulder Row with Anchored Resistance  - 1 x daily - 7 x weekly - 1 sets - 20 reps - 3 seconds hold - Supine Cervical Retraction with Towel  - 5 x daily - 7 x weekly - 5 reps - 5 seconds hold - Seated Upper Trap Stretch  - 2 x daily - 1-7 x weekly - 5 reps - 10 seconds hold - Seated Assisted Cervical Rotation with Towel  - 2 x daily - 7 x weekly - 5 reps - 10 seconds hold - Standing Scapular Retraction  - 5 x daily - 7 x weekly - 1 sets - 5 reps - 5 second hold - Standing Shoulder Internal Rotation Stretch with Hands Behind Back  - 2-3  x daily - 7 x weekly - 1 sets - 5 reps - 10 seconds hold - Shoulder External Rotation with Anchored Resistance  - 1 x daily - 7 x weekly - 2 sets - 10 reps - 3 hold - Standing Isometric Cervical Sidebending with Manual Resistance  - 1-2 x daily - 7 x weekly - 1 sets - 5 reps - 5 hold    ASSESSMENT:   CLINICAL IMPRESSION:  Pt still reporting pain in her upper neck. Pt stating she had a bad day yesterday with increased pain. Pt's cervical AROM has improved since her initial evaluation. Pt still progressing with cervical side bending and increasing her rotation. Mechanical traction performed today with good tolerance. Continue skilled PT to maximize pt's function.    OBJECTIVE IMPAIRMENTS: decreased activity tolerance, decreased shoulder mobility, decreased ROM, decreased strength, impaired flexibility, impaired UE use, postural dysfunction, and pain.   ACTIVITY LIMITATIONS: reaching, lifting, carry,  cleaning, driving, and or occupation   PERSONAL FACTORS:  also affecting patient's functional outcome.   REHAB POTENTIAL: Good   CLINICAL DECISION MAKING: Stable/uncomplicated   EVALUATION COMPLEXITY: Low       GOALS: Short term PT Goals Target date: 11/18/2021 Pt will be I and compliant with HEP. Baseline:  Goal status: MET 11/11/2021   Long term PT goals Target date: 12/19/2021 Pt will improve cervical AROM to >/= 60 degrees of cervical rotation  to improve functional mobility Baseline: Goal status: Partially met 11/25/21 (Rt rot is 60 deg)   Pt will improve shoulder strength to 5/5 MMT to improve functional strength Baseline: Goal status: On Going 11/13/2021   Pt will improve FOTO to > 68 % functional to show improved function Baseline: Goal status: Met 11/13/2021   Pt will reduce pain to overall less than 3/10 with usual activity and work activity. Baseline: Goal status: On Going 11/20/2021   PLAN: PT FREQUENCY: 2x/ week    PT DURATION: 8 weeks   PLANNED  INTERVENTIONS (unless contraindicated): aquatic PT,  Canalith repositioning, cryotherapy, Electrical stimulation, Iontophoresis with 4 mg/ml dexamethasome, Moist heat, traction, Ultrasound, gait training, Therapeutic exercise, balance training, neuromuscular re-education, patient/family education, prosthetic training, manual techniques, passive ROM, dry needling, taping, vasopnuematic device, vestibular, spinal manipulations, joint manipulations   PLAN FOR NEXT SESSION: Assess response to traction, repeat if desired, Address remaining cervical and shoulder AROM and (mostly) strength impairments.  Manual therapy and TPDN as needed for pain and muscle tightness.     Oretha Caprice, PT, MPT 11/25/2021, 9:18 AM   PHYSICAL THERAPY DISCHARGE SUMMARY  Visits from Start of Care: 10  Current functional level related to goals / functional outcomes: See above   Remaining deficits: See above   Education / Equipment: HEP   Patient agrees to discharge. Patient goals were partially met. Patient is being discharged due to not returning since the last visit.

## 2021-11-27 ENCOUNTER — Encounter: Payer: Medicare Other | Admitting: Rehabilitative and Restorative Service Providers"

## 2021-12-04 ENCOUNTER — Other Ambulatory Visit: Payer: Self-pay | Admitting: Internal Medicine

## 2021-12-04 DIAGNOSIS — Z1231 Encounter for screening mammogram for malignant neoplasm of breast: Secondary | ICD-10-CM

## 2021-12-05 ENCOUNTER — Encounter: Payer: Medicare Other | Admitting: Rehabilitative and Restorative Service Providers"

## 2021-12-16 DIAGNOSIS — Z23 Encounter for immunization: Secondary | ICD-10-CM | POA: Diagnosis not present

## 2021-12-30 ENCOUNTER — Ambulatory Visit (INDEPENDENT_AMBULATORY_CARE_PROVIDER_SITE_OTHER): Payer: Medicare Other

## 2021-12-30 ENCOUNTER — Ambulatory Visit: Payer: Medicare Other | Admitting: Orthopaedic Surgery

## 2021-12-30 ENCOUNTER — Encounter: Payer: Self-pay | Admitting: Orthopaedic Surgery

## 2021-12-30 DIAGNOSIS — Z96641 Presence of right artificial hip joint: Secondary | ICD-10-CM

## 2021-12-30 DIAGNOSIS — M542 Cervicalgia: Secondary | ICD-10-CM

## 2021-12-30 DIAGNOSIS — G8929 Other chronic pain: Secondary | ICD-10-CM

## 2021-12-30 DIAGNOSIS — M25512 Pain in left shoulder: Secondary | ICD-10-CM

## 2021-12-30 NOTE — Progress Notes (Signed)
Office Visit Note   Patient: Anna [name]           Date of Birth: Feb 25, 1945           MRN: [id number] Visit Date: 12/30/2021              Requested by: [name], MD [address] [place],  [place] [address] PCP: [name], MD   Assessment & Plan: Visit Diagnoses:  1. History of right hip replacement   2. Chronic left shoulder pain   3. Cervicalgia     Plan: Given patient's continued neck pain and previous MRI which showed mild to severe foraminal stenosis with diffuse degenerative changes of the cervical spine.  Recommend repeat MRI of the cervical spine to evaluate for progression of her cervical degenerative changes and stenosis.  We will have her follow-up after the MRI to go over results discuss further treatment.  Questions were encouraged and answered at length.  In regards to her right hip need to see her back at 1 year postop for an AP pelvis and a lateral view of the right hip.  Questions were encouraged and answered at length.  Follow-Up Instructions: Return after MRI.   Orders:  Orders Placed This Encounter  Procedures   XR HIP UNILAT W OR W/O PELVIS 1V RIGHT   XR Cervical Spine 2 or 3 views   XR Shoulder Left   No orders of the defined types were placed in this encounter.     Procedures: No procedures performed   Clinical Data: No additional findings.   Subjective: Chief Complaint  Patient presents with   Right Hip - Follow-up    HPI [name] returns today for follow-up of her right total hip which was performed 04/22/2021.  She states the right hip is doing well some achiness at times.  Main complaint today is neck and bilateral shoulder pain.  States that her neck pain can be 8-9 out of 10 pain at worst.  Radiates to the mid shaft of the left humerus.  Denies any numbness tingling down either arm.  She has trouble sleeping.  She does have some pain in the right shoulder at times.  History of right shoulder MRI 2020 which  showed partial tearing rotator cuff and a labral tear.  Also arthropathy of the [facility] joint.  Denies any subluxation or dislocation right shoulder. History of cervical spine MRI 2021 which showed global degenerative changes.  Mild spinal stenosis at C3-4 and C6-C7.  See 5 left foraminal stenosis moderate to severe bilateral moderate to severe C6 and C8 foraminal stenosis.  She reports that she underwent an ESI of her cervical spine in 2022 due to radicular symptoms down the right arm and she is no longer having radicular symptoms down the right arm at this point time. Review of Systems See HPI otherwise negative or noncontributory  Objective: Vital Signs: There were no vitals taken for this visit.  Physical Exam Constitutional:      Appearance: She is not ill-appearing or diaphoretic.  Cardiovascular:     Pulses: Normal pulses.  Pulmonary:     Effort: Pulmonary effort is normal.  Neurological:     Mental Status: She is alert and oriented to person, place, and time.  Psychiatric:        Mood and Affect: Mood normal.        Behavior: Behavior normal.     Ortho Exam Cervical spine: Full flexion extension without pain.  She  has full rotation left and right with discomfort with rotation of the left.  No tenderness about the spinal column or paraspinous region cervical spine.  Negative Spurling's.  No tenderness along medial borders of either scapula.  Nontender trapezius region both shoulders.  Bilateral shoulders 5 out of 5 strength with external/internal rotation against resistance empty can test is negative bilaterally.  Impingement testing positive on the right negative on the left.  Discomfort with overhead activity with the right shoulder however no diminished range of motion.  5 out of 5 strength throughout the upper extremities.  Full motor bilateral hands.  Subjective sensation intact bilateral hands light touch.  Radial pulses are 2+ and equal and symmetric bilaterally. Right hip: Good  range of motion of the right hip without pain.  Ambulates without any assistive device. Specialty Comments:  No specialty comments available.  Imaging: XR Shoulder Left  Result Date: 12/30/2021 Left shoulder 3 views: Shoulder is well located.  Mild AC joint arthritic changes.  Glenohumeral joint appears well-preserved.  No acute fractures.  XR Cervical Spine 2 or 3 views  Result Date: 12/30/2021 Cervical spine 2 views: No acute fractures.  Normal lordotic curvature.  Normal bone density.  No spondylolisthesis.  Anterior osteophytes off of C4-C7.  XR HIP UNILAT W OR W/O PELVIS 1V RIGHT  Result Date: 12/30/2021 AP pelvis lateral view right hip: Well-seated arthroplasty components.  No hardware failure.  No acute fractures.  Bilateral hips are well located.    PMFS History: Patient Active Problem List   Diagnosis Date Noted   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(CMC) joint of left thumb 06/10/2021   Digital mucous cyst of finger 06/10/2021   Status post total replacement of right hip 04/22/2021   Mixed hyperlipidemia 02/06/2021   Musculoskeletal chest pain 02/06/2021   Obesity (BMI 30-39.9) 02/06/2021   Unilateral primary osteoarthritis, right hip 11/06/2020   Unilateral primary osteoarthritis, right knee 11/06/2020   Chest pain of uncertain etiology 76/72/0947   Cardiac murmur 08/29/2020   Mixed dyslipidemia 08/29/2020   Arthritis 08/16/2020   Breast cancer ([place]) 08/16/2020   Headache 08/16/2020   Osteopenia 08/16/2020   Vision abnormalities 08/16/2020   Chronic right shoulder pain 03/07/2018   Chronic migraine 08/11/2017   Past Medical History:  Diagnosis Date   Arthritis    Breast cancer [facility])    age 54   Family history of adverse reaction to anesthesia    Son takes longer to wake   Headache    Hyperlipidemia    Osteopenia    Vision abnormalities     Family History  Problem Relation Age of Onset   Hyperlipidemia Mother    Hypertension Mother    Alzheimer's disease  Father    Hypertension Sister    Hypertension Sister    Hypertension Sister    Hypertension Brother    Lung cancer Brother    Hypertension Sister    Hypertension Brother     Past Surgical History:  Procedure Laterality Date   BACK SURGERY     lumbar region   BUNIONECTOMY Bilateral    MASTECTOMY Left    at age 76   TOTAL HIP ARTHROPLASTY Right 04/22/2021   Procedure: Right TOTAL HIP ARTHROPLASTY ANTERIOR APPROACH;  Surgeon: [name], MD;  Location: [facility];  Service: Orthopedics;  Laterality: Right;   TRIGGER FINGER RELEASE Left    left thumb   TUBAL LIGATION     Social History   Occupational History   Not on file  Tobacco  Use   Smoking status: Never   Smokeless tobacco: Never  Vaping Use   Vaping Use: Never used  Substance and Sexual Activity   Alcohol use: Never   Drug use: Never   Sexual activity: Not Currently

## 2021-12-30 NOTE — Addendum Note (Signed)
Addended by: Robyne Peers on: 12/30/2021 04:48 PM   Modules accepted: Orders

## 2022-01-08 DIAGNOSIS — Z23 Encounter for immunization: Secondary | ICD-10-CM | POA: Diagnosis not present

## 2022-01-17 ENCOUNTER — Ambulatory Visit
Admission: RE | Admit: 2022-01-17 | Discharge: 2022-01-17 | Disposition: A | Discharge status: Home / Self Care | Payer: Medicare Other | Source: Ambulatory Visit | Attending: Physician Assistant | Admitting: Physician Assistant

## 2022-01-17 DIAGNOSIS — M542 Cervicalgia: Secondary | ICD-10-CM | POA: Diagnosis not present

## 2022-01-17 DIAGNOSIS — M4802 Spinal stenosis, cervical region: Secondary | ICD-10-CM | POA: Diagnosis not present

## 2022-01-20 ENCOUNTER — Other Ambulatory Visit: Payer: Medicare Other

## 2022-02-04 ENCOUNTER — Ambulatory Visit
Admission: RE | Admit: 2022-02-04 | Discharge: 2022-02-04 | Disposition: A | Discharge status: Home / Self Care | Payer: Medicare Other | Source: Ambulatory Visit | Attending: Internal Medicine | Admitting: Internal Medicine

## 2022-02-04 DIAGNOSIS — R928 Other abnormal and inconclusive findings on diagnostic imaging of breast: Secondary | ICD-10-CM | POA: Diagnosis not present

## 2022-02-04 DIAGNOSIS — Z1231 Encounter for screening mammogram for malignant neoplasm of breast: Secondary | ICD-10-CM | POA: Diagnosis not present

## 2022-02-18 ENCOUNTER — Ambulatory Visit: Payer: Medicare Other | Admitting: Orthopaedic Surgery

## 2022-02-18 ENCOUNTER — Encounter: Payer: Self-pay | Admitting: Orthopaedic Surgery

## 2022-02-18 DIAGNOSIS — M542 Cervicalgia: Secondary | ICD-10-CM | POA: Diagnosis not present

## 2022-02-18 NOTE — Progress Notes (Signed)
The patient comes in today to go over MRI of her cervical spine.  She still gets neck pain worse on the left than the right but occasionally reaching overhead with the right shoulder gives her problems.  On exam today she does have pain throughout the arc of motion of rotation and bending of her cervical spine.  Both shoulders move smoothly and fluidly today.  She did not have any weakness in her arms or hands.  She has had some left hip pain but it moves smoothly and fluidly.  We have replaced her right hip.  The MRI of her cervical spine was compared to an MRI of 2021 and the radiologist states that she does have multifactorial stenosis there is foraminal at several areas.  This has been unchanged since 2021.  From our standpoint, not sure what else to try other than occasional muscle relaxant and anti-inflammatories or Tylenol.  She has been through physical therapy.  Right now, it does not look like there is indication for surgery or injection.  She understands all this.  If things worsen she will let us know.  For now follow-up is as needed.

## 2022-03-04 DIAGNOSIS — G8929 Other chronic pain: Secondary | ICD-10-CM | POA: Diagnosis not present

## 2022-03-04 DIAGNOSIS — H5203 Hypermetropia, bilateral: Secondary | ICD-10-CM | POA: Diagnosis not present

## 2022-03-04 DIAGNOSIS — E7800 Pure hypercholesterolemia, unspecified: Secondary | ICD-10-CM | POA: Diagnosis not present

## 2022-03-04 DIAGNOSIS — H2513 Age-related nuclear cataract, bilateral: Secondary | ICD-10-CM | POA: Diagnosis not present

## 2022-03-04 DIAGNOSIS — H52203 Unspecified astigmatism, bilateral: Secondary | ICD-10-CM | POA: Diagnosis not present

## 2022-03-04 DIAGNOSIS — H25013 Cortical age-related cataract, bilateral: Secondary | ICD-10-CM | POA: Diagnosis not present

## 2022-03-04 DIAGNOSIS — K219 Gastro-esophageal reflux disease without esophagitis: Secondary | ICD-10-CM | POA: Diagnosis not present

## 2022-03-04 DIAGNOSIS — H43813 Vitreous degeneration, bilateral: Secondary | ICD-10-CM | POA: Diagnosis not present

## 2022-03-04 DIAGNOSIS — E039 Hypothyroidism, unspecified: Secondary | ICD-10-CM | POA: Diagnosis not present

## 2022-03-13 ENCOUNTER — Ambulatory Visit (INDEPENDENT_AMBULATORY_CARE_PROVIDER_SITE_OTHER): Payer: Medicare Other

## 2022-03-13 ENCOUNTER — Encounter: Payer: Self-pay | Admitting: Physician Assistant

## 2022-03-13 ENCOUNTER — Ambulatory Visit: Payer: Medicare Other | Admitting: Physician Assistant

## 2022-03-13 ENCOUNTER — Telehealth: Payer: Self-pay

## 2022-03-13 DIAGNOSIS — Z96641 Presence of right artificial hip joint: Secondary | ICD-10-CM

## 2022-03-13 NOTE — Telephone Encounter (Signed)
Pt returning your phone call

## 2022-03-13 NOTE — Telephone Encounter (Signed)
Message on triage line. Cb [phone] pt called and advised that she is s/p a right total hip with CB 04/2021 and states that she is having hip pain and wants to know what she needs to do.

## 2022-03-13 NOTE — Telephone Encounter (Signed)
See other message

## 2022-03-13 NOTE — Telephone Encounter (Signed)
Pt returned call and I scheduled her to see maryann to get an xray to make sure everything looks ok

## 2022-03-13 NOTE — Progress Notes (Signed)
Office Visit Note   Patient: Anna [name]           Date of Birth: 1945/02/24           MRN: [id number] Visit Date: 03/13/2022              Requested by: [name], MD [address] [place],  [place] [address] PCP: [name], MD  Chief Complaint  Patient presents with   Right Hip - Pain      HPI: Anna [name] is a patient of Dr. Trevor Mace.  She is 11 months status post right total hip arthroplasty.  She is due to see him in a month for her 1 year visit.  She comes in today because of some right lateral hip pain she has had for a day or so.  She says it hurts when she lays on her side.  Denies any fever chills or injury.  Does not really complain of any pain in her groin  Assessment & Plan: Visit Diagnoses:  1. History of right hip replacement   2. Status post total replacement of right hip     Plan: X-rays look good today.  Also compared them to previous x-rays look very similar.  She does have findings consistent with some trochanteric bursitis.  We talked about the natural history of this.  I did offer her an injection but she said she defer this for now.  We talked about trying some Voltaren cream.  She is going to follow-up with Dr. Ninfa Linden in a month and certainly could revisit an injection into the troches bursa if she still has issues do not see any evidence of any infective process  Follow-Up Instructions: Return in about 1 month (around 04/11/2022).   Ortho Exam  Patient is alert, oriented, no adenopathy, well-dressed, normal affect, normal respiratory effort. Examination of her right hip she has no effusion no redness well-healed surgical scar.  She does have some sensitivity of the scar in the fold at the top of the thigh but there is no erythema or drainage or signs of concern.  She has excellent fluid motion of her hip without any reproduction of her symptoms.  She does have some tenderness to deep palpation over the trochanteric bursa.  She is  neurovascular intact with good strength  Imaging: XR HIP UNILAT W OR W/O PELVIS 2-3 VIEWS RIGHT  Result Date: 03/13/2022 Radiographs AP pelvis right hip demonstrate findings consistent with right hip replacement.  Good congruent spacing between the acetabular and femoral head components.  No evidence of any loosening or periprosthetic fracture  No images are attached to the encounter.  Labs: Lab Results  Component Value Date   ESRSEDRATE 7 08/11/2017   CRP <1 08/11/2017     Lab Results  Component Value Date   ALBUMIN 3.5 11/28/2020   ALBUMIN 4.0 08/07/2020   ALBUMIN 4.2 08/11/2017    Lab Results  Component Value Date   MG 2.0 08/08/2021   No results found for: "VD25OH"  No results found for: "PREALBUMIN"    Latest Ref Rng & Units 04/24/2021    6:42 AM 04/23/2021    7:05 AM 04/18/2021    8:42 AM  CBC EXTENDED  WBC 4.0 - 10.5 K/uL 10.9  11.5  5.4   RBC 3.87 - 5.11 MIL/uL 2.89  3.16  3.92   Hemoglobin 12.0 - 15.0 g/dL 9.1  9.8  12.0   HCT 36.0 - 46.0 % 26.5  29.4  37.2   Platelets 150 - 400 K/uL 170  197  235      There is no height or weight on file to calculate BMI.  Orders:  Orders Placed This Encounter  Procedures   XR HIP UNILAT W OR W/O PELVIS 2-3 VIEWS RIGHT   No orders of the defined types were placed in this encounter.    Procedures: No procedures performed  Clinical Data: No additional findings.  ROS:  All other systems negative, except as noted in the HPI. Review of Systems  Objective: Vital Signs: There were no vitals taken for this visit.  Specialty Comments:  No specialty comments available.  PMFS History: Patient Active Problem List   Diagnosis Date Noted   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(CMC) joint of left thumb 06/10/2021   Digital mucous cyst of finger 06/10/2021   Status post total replacement of right hip 04/22/2021   Mixed hyperlipidemia 02/06/2021   Musculoskeletal chest pain 02/06/2021   Obesity (BMI 30-39.9) 02/06/2021    Unilateral primary osteoarthritis, right hip 11/06/2020   Unilateral primary osteoarthritis, right knee 11/06/2020   Chest pain of uncertain etiology 0000000   Cardiac murmur 08/29/2020   Mixed dyslipidemia 08/29/2020   Arthritis 08/16/2020   Breast cancer ([place]) 08/16/2020   Headache 08/16/2020   Osteopenia 08/16/2020   Vision abnormalities 08/16/2020   Chronic right shoulder pain 03/07/2018   Chronic migraine 08/11/2017   Past Medical History:  Diagnosis Date   Arthritis    Breast cancer [facility])    age 23   Family history of adverse reaction to anesthesia    Son takes longer to wake   Headache    Hyperlipidemia    Osteopenia    Vision abnormalities     Family History  Problem Relation Age of Onset   Hyperlipidemia Mother    Hypertension Mother    Alzheimer's disease Father    Hypertension Sister    Hypertension Sister    Hypertension Sister    Hypertension Sister    Hypertension Brother    Lung cancer Brother    Hypertension Brother    Breast cancer Neg Hx     Past Surgical History:  Procedure Laterality Date   BACK SURGERY     lumbar region   BUNIONECTOMY Bilateral    MASTECTOMY Left    at age 69   [place] Right 04/22/2021   Procedure: Right TOTAL HIP ARTHROPLASTY ANTERIOR APPROACH;  Surgeon: [name], MD;  Location: [facility];  Service: Orthopedics;  Laterality: Right;   TRIGGER FINGER RELEASE Left    left thumb   TUBAL LIGATION     Social History   Occupational History   Not on file  Tobacco Use   Smoking status: Never   Smokeless tobacco: Never  Vaping Use   Vaping Use: Never used  Substance and Sexual Activity   Alcohol use: Never   Drug use: Never   Sexual activity: Not Currently

## 2022-03-13 NOTE — Telephone Encounter (Signed)
Lvm for pt to cb. She needs to be seen. Can see Velta Addison this afternoon

## 2022-03-19 DIAGNOSIS — H268 Other specified cataract: Secondary | ICD-10-CM | POA: Diagnosis not present

## 2022-03-19 DIAGNOSIS — H25011 Cortical age-related cataract, right eye: Secondary | ICD-10-CM | POA: Diagnosis not present

## 2022-03-19 DIAGNOSIS — H269 Unspecified cataract: Secondary | ICD-10-CM | POA: Diagnosis not present

## 2022-03-19 DIAGNOSIS — H2511 Age-related nuclear cataract, right eye: Secondary | ICD-10-CM | POA: Diagnosis not present

## 2022-03-30 DIAGNOSIS — D509 Iron deficiency anemia, unspecified: Secondary | ICD-10-CM | POA: Diagnosis not present

## 2022-03-30 DIAGNOSIS — K5904 Chronic idiopathic constipation: Secondary | ICD-10-CM | POA: Diagnosis not present

## 2022-03-30 DIAGNOSIS — K219 Gastro-esophageal reflux disease without esophagitis: Secondary | ICD-10-CM | POA: Diagnosis not present

## 2022-04-09 ENCOUNTER — Encounter: Payer: Self-pay | Admitting: Radiology

## 2022-04-14 DIAGNOSIS — M67441 Ganglion, right hand: Secondary | ICD-10-CM | POA: Diagnosis not present

## 2022-04-14 DIAGNOSIS — M1811 Unilateral primary osteoarthritis of first carpometacarpal joint, right hand: Secondary | ICD-10-CM | POA: Diagnosis not present

## 2022-04-16 DIAGNOSIS — H2512 Age-related nuclear cataract, left eye: Secondary | ICD-10-CM | POA: Diagnosis not present

## 2022-04-16 DIAGNOSIS — H25012 Cortical age-related cataract, left eye: Secondary | ICD-10-CM | POA: Diagnosis not present

## 2022-04-16 DIAGNOSIS — H269 Unspecified cataract: Secondary | ICD-10-CM | POA: Diagnosis not present

## 2022-04-27 ENCOUNTER — Ambulatory Visit: Payer: Medicare Other | Admitting: Orthopaedic Surgery

## 2022-04-27 ENCOUNTER — Encounter: Payer: Self-pay | Admitting: Orthopaedic Surgery

## 2022-04-27 ENCOUNTER — Other Ambulatory Visit: Payer: Self-pay

## 2022-04-27 DIAGNOSIS — K5904 Chronic idiopathic constipation: Secondary | ICD-10-CM | POA: Diagnosis not present

## 2022-04-27 DIAGNOSIS — Z96641 Presence of right artificial hip joint: Secondary | ICD-10-CM

## 2022-04-27 MED ORDER — METHYLPREDNISOLONE 4 MG PO TABS: ORAL_TABLET | ORAL | 0 refills | Status: DC

## 2022-04-27 NOTE — Progress Notes (Signed)
The patient comes in today at around a year status post a right total hip arthroplasty.  She still has some pain over the IT band and some in the groin but mainly just her incision.  She is an active 77 year old female.  She is walking without assistive device.  She actually was seen in the office in early February and x-rays were obtained then of her pelvis and right hip and I did see these and it showed a well-seated implant with no complicating features.  On exam her right hip moves smoothly and fluidly.  Her pain is mainly over the IT band and the trochanteric area.  There is a little bit of pain in the groin but this is more incisional pain.  She has tried Voltaren gel for this.  I would like to send her to outpatient physical therapy for any modalities that the therapist can try on her right hip IT band and groin area with no restrictions from an orthopedic standpoint.  I will try a steroid taper as well and would like to see her back in about 6 weeks.  She agrees with this treatment plan.

## 2022-05-06 ENCOUNTER — Ambulatory Visit: Payer: Medicare Other | Admitting: Physical Therapy

## 2022-05-06 ENCOUNTER — Other Ambulatory Visit: Payer: Self-pay

## 2022-05-06 ENCOUNTER — Encounter: Payer: Self-pay | Admitting: Physical Therapy

## 2022-05-06 DIAGNOSIS — M6281 Muscle weakness (generalized): Secondary | ICD-10-CM

## 2022-05-06 DIAGNOSIS — M79604 Pain in right leg: Secondary | ICD-10-CM

## 2022-05-06 DIAGNOSIS — R29898 Other symptoms and signs involving the musculoskeletal system: Secondary | ICD-10-CM

## 2022-05-06 DIAGNOSIS — R2681 Unsteadiness on feet: Secondary | ICD-10-CM

## 2022-05-06 NOTE — Therapy (Signed)
OUTPATIENT PHYSICAL THERAPY LOWER EXTREMITY EVALUATION   Patient Name: Anna [name] MRN: [id number] DOB:Jun 16, 1945, 77 y.o., female Today's Date: 05/06/2022  END OF SESSION:  PT End of Session - 05/06/22 0908     Visit Number 1    Number of Visits 13    Date for PT Re-Evaluation 06/17/22    Authorization Type [employer] MCR    Authorization Time Period 05/06/22 to 06/17/22    Progress Note Due on Visit 10    PT Start Time 0845    PT Stop Time 0928    PT Time Calculation (min) 43 min    Activity Tolerance Patient tolerated treatment well    Behavior During Therapy [facility] for tasks assessed/performed             Past Medical History:  Diagnosis Date   Arthritis    Breast cancer    age 58   Family history of adverse reaction to anesthesia    Son takes longer to wake   Headache    Hyperlipidemia    Osteopenia    Vision abnormalities    Past Surgical History:  Procedure Laterality Date   BACK SURGERY     lumbar region   BUNIONECTOMY Bilateral    MASTECTOMY Left    at age 3   TOTAL HIP ARTHROPLASTY Right 04/22/2021   Procedure: Right TOTAL HIP ARTHROPLASTY ANTERIOR APPROACH;  Surgeon: [name], MD;  Location: [place];  Service: Orthopedics;  Laterality: Right;   TRIGGER FINGER RELEASE Left    left thumb   TUBAL LIGATION     Patient Active Problem List   Diagnosis Date Noted   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[facility]) joint of left thumb 06/10/2021   Digital mucous cyst of finger 06/10/2021   Status post total replacement of right hip 04/22/2021   Mixed hyperlipidemia 02/06/2021   Musculoskeletal chest pain 02/06/2021   Obesity (BMI 30-39.9) 02/06/2021   Unilateral primary osteoarthritis, right hip 11/06/2020   Unilateral primary osteoarthritis, right knee 11/06/2020   Chest pain of uncertain etiology 0000000   Cardiac murmur 08/29/2020   Mixed dyslipidemia 08/29/2020   Arthritis 08/16/2020   Breast cancer 08/16/2020   Headache 08/16/2020   Osteopenia  08/16/2020   Vision abnormalities 08/16/2020   Chronic right shoulder pain 03/07/2018   Chronic migraine 08/11/2017    PCP: [name] MD   REFERRING PROVIDER: [name], MD  REFERRING DIAG: [phone] (ICD-10-CM) - History of right hip replacement  THERAPY DIAG:  Pain in right leg  Muscle weakness (generalized)  Other symptoms and signs involving the musculoskeletal system  Unsteadiness on feet  Rationale for Evaluation and Treatment: Rehabilitation  ONSET DATE: 04/27/2022  SUBJECTIVE:   SUBJECTIVE STATEMENT:  Ever since surgery my leg has been tender, I think part of it is still swollen. Sometimes its difficult to lay on that side. I avoid stairs as much possible because of a bad knee. No falls but some close calls. Pain radiates down to the knee, I have tender spots coming up the thigh since surgery. Scar is still a bit tender too.   PERTINENT HISTORY: The patient comes in today at around a year status post a right total hip arthroplasty.  She still has some pain over the IT band and some in the groin but mainly just her incision.  She is an active 77 year old female.  She is walking without assistive device.  She actually was seen in the office in early February and x-rays were obtained then  of her pelvis and right hip and I did see these and it showed a well-seated implant with no complicating features.   On exam her right hip moves smoothly and fluidly.  Her pain is mainly over the IT band and the trochanteric area.  There is a little bit of pain in the groin but this is more incisional pain.  She has tried Voltaren gel for this.   I would like to send her to outpatient physical therapy for any modalities that the therapist can try on her right hip IT band and groin area with no restrictions from an orthopedic standpoint.  I will try a steroid taper as well and would like to see her back in about 6 weeks.  She agrees with this treatment plan.   PAIN:  Are  you having pain? Yes: NPRS scale: 1/10; at worst can get to 5-6/10 Pain location: outside of R leg running down to knee  Pain description: tender  Aggravating factors: laying on that side Relieving factors: nothing   PRECAUTIONS: None  WEIGHT BEARING RESTRICTIONS: No  FALLS:  Has patient fallen in last 6 months? No  LIVING ENVIRONMENT: Lives with: lives alone Lives in: House/apartment Stairs: 4-5 steps in front but doesn't usually go this way, preferred entrance has 1 step  Has following equipment at home:  hurry-cane   OCCUPATION: retired   PLOF: Independent, Independent with basic ADLs, Independent with gait, and Independent with transfers  PATIENT GOALS: get rid of pain, be able to lay on right side comfortably   NEXT MD VISIT: Dr. Ninfa Linden May 6th   OBJECTIVE:   DIAGNOSTIC FINDINGS: Radiographs AP pelvis right hip demonstrate findings consistent with right  hip replacement.  Good congruent spacing between the acetabular and  femoral head components.  No evidence of any loosening or periprosthetic  fracture  PATIENT SURVEYS:  FOTO 77, predicted 55  COGNITION: Overall cognitive status: Within functional limits for tasks assessed     SENSATION: Not tested "maybe some numbness down the outside of that leg"   EDEMA:  Reports some edema around hip   MUSCLE LENGTH:  Quads: severe limitation B  Hip flexors: moderate limitation B Hamstrings: moderate limitation B Piriformis: severe limitation R, moderate limitation L   POSTURE: rounded shoulders, forward head, increased thoracic kyphosis, and flexed trunk   PALPATION:  Upper R glute tender and with some deep trigger points, very TTP lateral R thigh  LOWER EXTREMITY ROM:  Active ROM Right eval Left eval  Hip flexion    Hip extension    Hip abduction    Hip adduction    Hip internal rotation    Hip external rotation    Knee flexion    Knee extension    Ankle dorsiflexion    Ankle plantarflexion     Ankle inversion    Ankle eversion     (Blank rows = not tested)  LOWER EXTREMITY MMT:  MMT Right eval Left eval  Hip flexion 3- 3-  Hip extension 3- 3-  Hip abduction 4 3+  Hip adduction    Hip internal rotation    Hip external rotation    Knee flexion 4 4  Knee extension 4+ 4+  Ankle dorsiflexion 5 5  Ankle plantarflexion    Ankle inversion    Ankle eversion     (Blank rows = not tested)  LOWER EXTREMITY SPECIAL TESTS:    FUNCTIONAL TESTS:  Dynamic Gait Index: 18/24  GAIT: Distance walked: in clinic distances  Assistive device utilized: None Level of assistance: Complete Independence Comments: flexed at hips, + trendelenburg    TODAY'S TREATMENT:                                                                                                                              DATE:   Eval  Objective measures + appropriate education  TherEx   Nustep L4x6 minutes BLEs only Bridges + red TB x5 Supine clams red TB x5 Piriformis stretch x30 seconds B HS stretch x30 seconds B    PATIENT EDUCATION:  Education details: exam findings, POC, HEP  Person educated: Patient Education method: Consulting civil engineer, Demonstration, and Handouts Education comprehension: verbalized understanding, returned demonstration, and needs further education  HOME EXERCISE PROGRAM: Access Code: [email or web address] URL: [email or web address] Date: 05/06/2022 Prepared by: Deniece Ree  Exercises - Supine Bridge with Resistance Band  - 2 x daily - 7 x weekly - 1 sets - 10 reps - 3 hold - Hooklying Clamshell with Resistance  - 2 x daily - 7 x weekly - 1 sets - 10 reps - 3 hold - Seated Figure 4 Piriformis Stretch  - 2 x daily - 7 x weekly - 1 sets - 3 reps - 30 hold - Seated Hamstring Stretch  - 2 x daily - 7 x weekly - 1 sets - 3 reps - 30 hold  ASSESSMENT:  CLINICAL IMPRESSION: Patient is a 77 y.o. F who was seen today for physical therapy evaluation and treatment for pain in R LE  following remote hx of R THR. Exam reveals severe functional muscle weakness, impaired muscle flexibility, multiple painful trigger points, postural impairments, and impaired functional balance. Will benefit from skilled PT services to address all impairments, reduce pain, and assist in reaching optimal level of function.   OBJECTIVE IMPAIRMENTS: Abnormal gait, decreased balance, difficulty walking, decreased strength, increased fascial restrictions, increased muscle spasms, impaired flexibility, improper body mechanics, postural dysfunction, and pain.   ACTIVITY LIMITATIONS: standing, squatting, stairs, transfers, and locomotion level  PARTICIPATION LIMITATIONS: cleaning, laundry, driving, shopping, community activity, occupation, and yard work  PERSONAL FACTORS: Age, Fitness, Past/current experiences, and Time since onset of injury/illness/exacerbation are also affecting patient's functional outcome.   REHAB POTENTIAL: Good  CLINICAL DECISION MAKING: Stable/uncomplicated  EVALUATION COMPLEXITY: Low   GOALS: Goals reviewed with patient? Yes  SHORT TERM GOALS: Target date: 05/27/2022   Will be compliant with appropriate progressive HEP  Baseline: Goal status: INITIAL  2.  Pain when laying on right side to be no more than 2/10 at worst  Baseline:  Goal status: INITIAL  3.  Muscle flexibility to be no more than 25% limited in all tight groups  Baseline:  Goal status: INITIAL  4.  Will be independent with soft tissue spasm management techniques including foam roller or tennis ball massage  Baseline:  Goal status: INITIAL   LONG TERM GOALS: Target date: 06/17/2022    MMT to improve by at least 1 grade  in all weak groups  Baseline:  Goal status: INITIAL  2.  Will score at least 22/24 on DGI  Baseline:  Goal status: INITIAL  3.  Will be able to ambulate at least 1 mile without increase in pain  Baseline:  Goal status: INITIAL  4.  Will be compliant with appropriate gym  based exercise program to maintain functional gains/prevent recurrence of condition  Baseline:  Goal status: INITIAL    PLAN:  PT FREQUENCY: 2x/week  PT DURATION: 6 weeks  PLANNED INTERVENTIONS: Therapeutic exercises, Therapeutic activity, Neuromuscular re-education, Balance training, Gait training, Patient/Family education, Self Care, Joint mobilization, Stair training, Aquatic Therapy, Dry Needling, Electrical stimulation, Cryotherapy, Moist heat, Taping, Ultrasound, Ionotophoresis 4mg /ml Dexamethasone, Manual therapy, and Re-evaluation  PLAN FOR NEXT SESSION: general strength and flexibility, consider DN, balance training   Deniece Ree PT DPT PN2

## 2022-05-08 ENCOUNTER — Ambulatory Visit: Payer: Medicare Other | Attending: Cardiology | Admitting: Cardiology

## 2022-05-08 ENCOUNTER — Encounter: Payer: Self-pay | Admitting: Cardiology

## 2022-05-08 VITALS — BP 110/62 | HR 64 | Ht 66.0 in | Wt 195.2 lb

## 2022-05-08 DIAGNOSIS — I2510 Atherosclerotic heart disease of native coronary artery without angina pectoris: Secondary | ICD-10-CM | POA: Diagnosis not present

## 2022-05-08 DIAGNOSIS — I10 Essential (primary) hypertension: Secondary | ICD-10-CM | POA: Diagnosis not present

## 2022-05-08 DIAGNOSIS — E782 Mixed hyperlipidemia: Secondary | ICD-10-CM | POA: Diagnosis not present

## 2022-05-08 NOTE — Patient Instructions (Signed)
Medication Instructions:  Your physician recommends that you continue on your current medications as directed. Please refer to the Current Medication list given to you today.  *If you need a refill on your cardiac medications before your next appointment, please call your pharmacy*   Lab Work: Your physician recommends that you have labs drawn today: Lipids If you have labs (blood work) drawn today and your tests are completely normal, you will receive your results only by: MyChart Message (if you have MyChart) OR A paper copy in the mail If you have any lab test that is abnormal or we need to change your treatment, we will call you to review the results.   Testing/Procedures: None   Follow-Up: At [facility], you and your health needs are our priority.  As part of our continuing mission to provide you with exceptional heart care, we have created designated Provider Care Teams.  These Care Teams include your primary Cardiologist (physician) and Advanced Practice Providers (APPs -  Physician Assistants and Nurse Practitioners) who all work together to provide you with the care you need, when you need it.  Your next appointment:   6 month(s)  Provider:   Thomasene Ripple, DO

## 2022-05-08 NOTE — Progress Notes (Signed)
Cardiology Office Note:    Date:  05/08/2022   ID:  [name], DOB 11-Jun-1945, MRN [id number]  PCP:  Merri Brunette, MD  Cardiologist:  Thomasene Ripple, DO  Electrophysiologist:  None   Referring MD: Merri Brunette, MD   " I am ok"   History of Present Illness:    Anna [name] is a 77 y.o. female with a hx of CAD seen on [facility], hyperlipidemia , here for follow up.   Since her last visit we talked about her CT scan results.  But she notes that she has seen GI for her constipation and has been taking NSAIDs which has improved things some but not completely resolved her constipation.   Past Medical History:  Diagnosis Date   Arthritis    Breast cancer    age 54   Family history of adverse reaction to anesthesia    Son takes longer to wake   Headache    Hyperlipidemia    Osteopenia    Vision abnormalities     Past Surgical History:  Procedure Laterality Date   BACK SURGERY     lumbar region   BUNIONECTOMY Bilateral    MASTECTOMY Left    at age 30   TOTAL HIP ARTHROPLASTY Right 04/22/2021   Procedure: Right TOTAL HIP ARTHROPLASTY ANTERIOR APPROACH;  Surgeon: Kathryne Hitch, MD;  Location: [facility] OR;  Service: Orthopedics;  Laterality: Right;   TRIGGER FINGER RELEASE Left    left thumb   TUBAL LIGATION      Current Medications: Current Meds  Medication Sig   Acetaminophen 500 MG capsule Take 500-1,000 mg by mouth daily. Take 1000 mg in the morning and 500 mg at bedtime   ASPERCREME LIDOCAINE EX Apply 1 application. topically daily as needed (Pain).   aspirin EC 81 MG tablet Take 1 tablet (81 mg total) by mouth daily. Swallow whole.   Calcium 600-200 MG-UNIT tablet Take 2 tablets by mouth daily.   Cholecalciferol (VITAMIN D-3) 125 MCG (5000 UT) TABS Take 5,000 Units by mouth daily.   famotidine (PEPCID) 40 MG tablet Take 40 mg by mouth at bedtime.   ferrous sulfate 324 MG TBEC Take 324 mg by mouth daily with breakfast.   ivabradine (CORLANOR) 5 MG TABS tablet  Take 10 mg (2 tablets) 2 hours before CT scan   levothyroxine (SYNTHROID) 100 MCG tablet Take 100 mcg by mouth daily.   lidocaine (LIDODERM) 5 % Place 1 patch onto the skin daily. Remove & Discard patch within 12 hours or as directed by MD   LINZESS 145 MCG CAPS capsule Take 145 mcg by mouth every morning.   methylPREDNISolone (MEDROL) 4 MG tablet Medrol dose pack. Take as instructed   nabumetone (RELAFEN) 500 MG tablet Take 1 tablet (500 mg total) by mouth 2 (two) times daily as needed.   omeprazole (PRILOSEC) 20 MG capsule Take 1 capsule (20 mg total) by mouth daily.   OVER THE COUNTER MEDICATION Place 1 drop into both eyes daily as needed (Dry eye). Dry eye formula   rosuvastatin (CRESTOR) 5 MG tablet Take 1 tablet (5 mg total) by mouth daily.   SUMAtriptan (IMITREX) 25 MG tablet Take 1 tab at onset of migraine.  May repeat in 2 hrs, if needed.  Max dose: 2 tabs/day. This is a 30 day prescription.   tiZANidine (ZANAFLEX) 4 MG tablet Take 1 tablet (4 mg total) by mouth every 8 (eight) hours as needed for muscle spasms.     Allergies:  Septra [sulfamethoxazole-trimethoprim] and Sulfa antibiotics   Social History   Socioeconomic History   Marital status: Widowed    Spouse name: Not on file   Number of children: 2   Years of education: Not on file   Highest education level: Not on file  Occupational History   Not on file  Tobacco Use   Smoking status: Never   Smokeless tobacco: Never  Vaping Use   Vaping Use: Never used  Substance and Sexual Activity   Alcohol use: Never   Drug use: Never   Sexual activity: Not Currently  Other Topics Concern   Not on file  Social History Narrative   Not on file   Social Determinants of Health   Financial Resource Strain: Not on file  Food Insecurity: Not on file  Transportation Needs: Not on file  Physical Activity: Not on file  Stress: Not on file  Social Connections: Not on file     Family History: The patient's family history  includes Alzheimer's disease in her father; Hyperlipidemia in her mother; Hypertension in her brother, brother, mother, sister, sister, sister, and sister; Lung cancer in her brother. There is no history of Breast cancer.  ROS:   Review of Systems  Constitution: Negative for decreased appetite, fever and weight gain.  HENT: Negative for congestion, ear discharge, hoarse voice and sore throat.   Eyes: Negative for discharge, redness, vision loss in right eye and visual halos.  Cardiovascular: Negative for chest pain, dyspnea on exertion, leg swelling, orthopnea and palpitations.  Respiratory: Negative for cough, hemoptysis, shortness of breath and snoring.   Endocrine: Negative for heat intolerance and polyphagia.  Hematologic/Lymphatic: Negative for bleeding problem. Does not bruise/bleed easily.  Skin: Negative for flushing, nail changes, rash and suspicious lesions.  Musculoskeletal: Negative for arthritis, joint pain, muscle cramps, myalgias, neck pain and stiffness.  Gastrointestinal: Negative for abdominal pain, bowel incontinence, diarrhea and excessive appetite.  Genitourinary: Negative for decreased libido, genital sores and incomplete emptying.  Neurological: Negative for brief paralysis, focal weakness, headaches and loss of balance.  Psychiatric/Behavioral: Negative for altered mental status, depression and suicidal ideas.  Allergic/Immunologic: Negative for HIV exposure and persistent infections.    EKGs/Labs/Other Studies Reviewed:    The following studies were reviewed today:   EKG:  The ekg ordered today demonstrates sinus rhythm, 60 beats minute with nonspecific ST abnormalities.   Transthoracic echocardiogram August 2022 IMPRESSIONS   1. Left ventricular ejection fraction, by estimation, is 60 to 65%. The left ventricle has normal function. The left ventricle has no regional wall motion abnormalities. There is mild left ventricular hypertrophy.  Left ventricular  diastolic parameters  are consistent with Grade I diastolic dysfunction (impaired relaxation).   2. Right ventricular systolic function is normal. The right ventricular  size is normal. There is normal pulmonary artery systolic pressure. The  estimated right ventricular systolic pressure is 29.8 mmHg.   3. Left atrial size was mildly dilated.   4. The mitral valve is normal in structure. Trivial mitral valve  regurgitation. No evidence of mitral stenosis.   5. The aortic valve is tricuspid. Aortic valve regurgitation is not  visualized. No aortic stenosis is present.   6. The inferior vena cava is normal in size with greater than 50%  respiratory variability, suggesting right atrial pressure of 3 mmHg.   FINDINGS   Left Ventricle: Left ventricular ejection fraction, by estimation, is 60  to 65%. The left ventricle has normal function. The left ventricle has no  regional wall motion abnormalities. The left ventricular internal cavity  size was normal in size. There is   mild left ventricular hypertrophy. Left ventricular diastolic parameters  are consistent with Grade I diastolic dysfunction (impaired relaxation).   Right Ventricle: The right ventricular size is normal. No increase in  right ventricular wall thickness. Right ventricular systolic function is  normal. There is normal pulmonary artery systolic pressure. The tricuspid  regurgitant velocity is 2.59 m/s, and   with an assumed right atrial pressure of 3 mmHg, the estimated right  ventricular systolic pressure is 29.8 mmHg.   Left Atrium: Left atrial size was mildly dilated.   Right Atrium: Right atrial size was normal in size.   Pericardium: Trivial pericardial effusion is present.   Mitral Valve: The mitral valve is normal in structure. Trivial mitral  valve regurgitation. No evidence of mitral valve stenosis.   Tricuspid Valve: The tricuspid valve is normal in structure. Tricuspid  valve regurgitation is mild . No  evidence of tricuspid stenosis.   Aortic Valve: The aortic valve is tricuspid. Aortic valve regurgitation is  not visualized. No aortic stenosis is present.   Pulmonic Valve: The pulmonic valve was normal in structure. Pulmonic valve  regurgitation is trivial. No evidence of pulmonic stenosis.   Aorta: The aortic root is normal in size and structure.   Venous: The inferior vena cava is normal in size with greater than 50%  respiratory variability, suggesting right atrial pressure of 3 mmHg.     Nuclear stress test Nuclear stress EF: 57%. The left ventricular ejection fraction is normal (55-65%). This is a low risk study. There is no evidence of ischemia and no evidence of previous infarction. The study is normal. Recent Labs: 08/08/2021: BUN 18; Creatinine, Ser 0.91; Magnesium 2.0; Potassium 3.7; Sodium 143  Recent Lipid Panel No results found for: "CHOL", "TRIG", "HDL", "CHOLHDL", "VLDL", "LDLCALC", "LDLDIRECT"  Physical Exam:    VS:  BP 110/62   Pulse 64   Ht 5\' 6"  (1.676 m)   Wt 195 lb 3.2 oz (88.5 kg)   SpO2 99%   BMI 31.51 kg/m     Wt Readings from Last 3 Encounters:  05/08/22 195 lb 3.2 oz (88.5 kg)  08/08/21 193 lb (87.5 kg)  04/22/21 196 lb (88.9 kg)     GEN: Well nourished, well developed in no acute distress HEENT: Normal NECK: No JVD; No carotid bruits LYMPHATICS: No lymphadenopathy CARDIAC: S1S2 noted,RRR, no murmurs, rubs, gallops RESPIRATORY:  Clear to auscultation without rales, wheezing or rhonchi  ABDOMEN: Soft, non-tender, non-distended, +bowel sounds, no guarding. EXTREMITIES: No edema, No cyanosis, no clubbing MUSCULOSKELETAL:  No deformity  SKIN: Warm and dry NEUROLOGIC:  Alert and oriented x 3, non-focal PSYCHIATRIC:  Normal affect, good insight  ASSESSMENT:    1. Mixed hyperlipidemia   2. CAD in native artery   3. Primary hypertension    PLAN:    From a cardiovascular standpoint no change in her medication at this time.  Will keep the  patient on her aspirin 81 mg daily as well as the rosuvastatin.  Blood pressure is acceptable, continue with current antihypertensive regimen.  The patient understands the need to lose weight with diet and exercise. We have discussed specific strategies for this.   The patient is in agreement with the above plan. The patient left the office in stable condition.  The patient will follow up in   Medication Adjustments/Labs and Tests Ordered: Current medicines are reviewed at length with the  patient today.  Concerns regarding medicines are outlined above.  Orders Placed This Encounter  Procedures   Lipid panel   EKG 12-Lead   No orders of the defined types were placed in this encounter.   Patient Instructions  Medication Instructions:  Your physician recommends that you continue on your current medications as directed. Please refer to the Current Medication list given to you today.  *If you need a refill on your cardiac medications before your next appointment, please call your pharmacy*   Lab Work: Your physician recommends that you have labs drawn today: Lipids If you have labs (blood work) drawn today and your tests are completely normal, you will receive your results only by: MyChart Message (if you have MyChart) OR A paper copy in the mail If you have any lab test that is abnormal or we need to change your treatment, we will call you to review the results.   Testing/Procedures: None   Follow-Up: At [facility], you and your health needs are our priority.  As part of our continuing mission to provide you with exceptional heart care, we have created designated Provider Care Teams.  These Care Teams include your primary Cardiologist (physician) and Advanced Practice Providers (APPs -  Physician Assistants and Nurse Practitioners) who all work together to provide you with the care you need, when you need it.  Your next appointment:   6 month(s)  Provider:    Thomasene Ripple, DO      Adopting a Healthy Lifestyle.  Know what a healthy weight is for you (roughly BMI <25) and aim to maintain this   Aim for 7+ servings of fruits and vegetables daily   65-80+ fluid ounces of water or unsweet tea for healthy kidneys   Limit to max 1 drink of alcohol per day; avoid smoking/tobacco   Limit animal fats in diet for cholesterol and heart health - choose grass fed whenever available   Avoid highly processed foods, and foods high in saturated/trans fats   Aim for low stress - take time to unwind and care for your mental health   Aim for 150 min of moderate intensity exercise weekly for heart health, and weights twice weekly for bone health   Aim for 7-9 hours of sleep daily   When it comes to diets, agreement about the perfect plan isnt easy to find, even among the experts. Experts at the [facility] [employer] developed an idea known as the Healthy Eating Plate. Just imagine a plate divided into logical, healthy portions.   The emphasis is on diet quality:   Load up on vegetables and fruits - one-half of your plate: Aim for color and variety, and remember that potatoes dont count.   Go for whole grains - one-quarter of your plate: Whole wheat, barley, wheat berries, quinoa, oats, brown rice, and foods made with them. If you want pasta, go with whole wheat pasta.   Protein power - one-quarter of your plate: Fish, chicken, beans, and nuts are all healthy, versatile protein sources. Limit red meat.   The diet, however, does go beyond the plate, offering a few other suggestions.   Use healthy plant oils, such as olive, canola, soy, corn, sunflower and peanut. Check the labels, and avoid partially hydrogenated oil, which have unhealthy trans fats.   If youre thirsty, drink water. Coffee and tea are good in moderation, but skip sugary drinks and limit milk and dairy products to one or two daily servings.  The type of carbohydrate in the  diet is more important than the amount. Some sources of carbohydrates, such as vegetables, fruits, whole grains, and beans-are healthier than others.   Finally, stay active  Signed, Thomasene RippleKardie Daphyne Miguez, DO  05/08/2022 9:33 PM    [facility]

## 2022-05-09 LAB — LIPID PANEL
Chol/HDL Ratio: 2.5 ratio (ref 0.0–4.4)
Cholesterol, Total: 145 mg/dL (ref 100–199)
HDL: 58 mg/dL (ref >39)
LDL Chol Calc (NIH): 69 mg/dL (ref 0–99)
Triglycerides: 96 mg/dL (ref 0–149)
VLDL Cholesterol Cal: 18 mg/dL (ref 5–40)

## 2022-05-14 ENCOUNTER — Encounter: Payer: Self-pay | Admitting: Physical Therapy

## 2022-05-14 ENCOUNTER — Ambulatory Visit: Payer: Medicare Other | Admitting: Physical Therapy

## 2022-05-14 DIAGNOSIS — M79604 Pain in right leg: Secondary | ICD-10-CM

## 2022-05-14 DIAGNOSIS — R2681 Unsteadiness on feet: Secondary | ICD-10-CM

## 2022-05-14 DIAGNOSIS — R29898 Other symptoms and signs involving the musculoskeletal system: Secondary | ICD-10-CM

## 2022-05-14 DIAGNOSIS — M6281 Muscle weakness (generalized): Secondary | ICD-10-CM | POA: Diagnosis not present

## 2022-05-14 NOTE — Therapy (Signed)
OUTPATIENT PHYSICAL THERAPY TREATMENT   Patient Name: Anna [name] MRN: [id number] DOB:10/16/1945, 77 y.o., female Today's Date: 05/14/2022  END OF SESSION:  PT End of Session - 05/14/22 0842     Visit Number 2    Number of Visits 13    Date for PT Re-Evaluation 06/17/22    Authorization Type [employer] MCR    Authorization Time Period 05/06/22 to 06/17/22    Progress Note Due on Visit 10    PT Start Time 0845    PT Stop Time 0923    PT Time Calculation (min) 38 min    Activity Tolerance Patient tolerated treatment well    Behavior [facility] for tasks assessed/performed              Past Medical History:  Diagnosis Date   Arthritis    Breast cancer    age 77   Family history of adverse reaction to anesthesia    Son takes longer to wake   Headache    Hyperlipidemia    Osteopenia    Vision abnormalities    Past Surgical History:  Procedure Laterality Date   BACK SURGERY     lumbar region   BUNIONECTOMY Bilateral    MASTECTOMY Left    at age 89   TOTAL HIP ARTHROPLASTY Right 04/22/2021   Procedure: Right TOTAL HIP ARTHROPLASTY ANTERIOR APPROACH;  Surgeon: Kathryne HitchBlackman, Christopher Y, MD;  Location: [facility] OR;  Service: Orthopedics;  Laterality: Right;   TRIGGER FINGER RELEASE Left    left thumb   TUBAL LIGATION     Patient Active Problem List   Diagnosis Date Noted   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 06/10/2021   Digital mucous cyst of finger 06/10/2021   Status post total replacement of right hip 04/22/2021   Mixed hyperlipidemia 02/06/2021   Musculoskeletal chest pain 02/06/2021   Obesity (BMI 30-39.9) 02/06/2021   Unilateral primary osteoarthritis, right hip 11/06/2020   Unilateral primary osteoarthritis, right knee 11/06/2020   Chest pain of uncertain etiology 08/29/2020   Cardiac murmur 08/29/2020   Mixed dyslipidemia 08/29/2020   Arthritis 08/16/2020   Breast cancer 08/16/2020   Headache 08/16/2020   Osteopenia 08/16/2020    Vision abnormalities 08/16/2020   Chronic right shoulder pain 03/07/2018   Chronic migraine 08/11/2017    PCP: Merri BrunettePharr, Walter MD   REFERRING PROVIDER: Kathryne HitchBlackman, Christopher Y, MD  REFERRING DIAG: [phone] [id number] (ICD-10-CM) - History of right hip replacement  THERAPY DIAG:  Pain in right leg  Muscle weakness (generalized)  Other symptoms and signs involving the musculoskeletal system  Unsteadiness on feet  Rationale for Evaluation and Treatment: Rehabilitation  ONSET DATE: 04/27/2022  SUBJECTIVE:   SUBJECTIVE STATEMENT: No significant pain; c/o soreness.   PERTINENT HISTORY: The patient comes in today at around a year status post a right total hip arthroplasty.  She still has some pain over the IT band and some in the groin but mainly just her incision.  She is an active 77 year old female.  She is walking without assistive device.  She actually was seen in the office in early February and x-rays were obtained then of her pelvis and right hip and I did see these and it showed a well-seated implant with no complicating features.   On exam her right hip moves smoothly and fluidly.  Her pain is mainly over the IT band and the trochanteric area.  There is a little bit of pain in the groin but this is more incisional  pain.  She has tried Voltaren gel for this.   I would like to send her to outpatient physical therapy for any modalities that the therapist can try on her right hip IT band and groin area with no restrictions from an orthopedic standpoint.  I will try a steroid taper as well and would like to see her back in about 6 weeks.  She agrees with this treatment plan.   PAIN:  Are you having pain? Yes: NPRS scale: 1/10; at worst can get to 5-6/10 Pain location: outside of R leg running down to knee  Pain description: tender  Aggravating factors: laying on that side Relieving factors: nothing   PRECAUTIONS: None  WEIGHT BEARING RESTRICTIONS: No  FALLS:  Has patient fallen in  last 6 months? No  LIVING ENVIRONMENT: Lives with: lives alone Lives in: House/apartment Stairs: 4-5 steps in front but doesn't usually go this way, preferred entrance has 1 step  Has following equipment at home:  hurry-cane   OCCUPATION: retired   PLOF: Independent, Independent with basic ADLs, Independent with gait, and Independent with transfers  PATIENT GOALS: get rid of pain, be able to lay on right side comfortably   OBJECTIVE:   DIAGNOSTIC FINDINGS: Radiographs AP pelvis right hip demonstrate findings consistent with right  hip replacement.  Good congruent spacing between the acetabular and  femoral head components.  No evidence of any loosening or periprosthetic  fracture  PATIENT SURVEYS:  05/06/22: FOTO 77, predicted 77   SENSATION: Not tested "maybe some numbness down the outside of that leg"   EDEMA:  Reports some edema around hip   MUSCLE LENGTH:  Quads: severe limitation B  Hip flexors: moderate limitation B Hamstrings: moderate limitation B Piriformis: severe limitation R, moderate limitation L   POSTURE: rounded shoulders, forward head, increased thoracic kyphosis, and flexed trunk   PALPATION: Upper R glute tender and with some deep trigger points, very TTP lateral R thigh   LOWER EXTREMITY MMT:  MMT Right eval Left eval  Hip flexion 3- 3-  Hip extension 3- 3-  Hip abduction 4 3+  Hip adduction    Hip internal rotation    Hip external rotation    Knee flexion 4 4  Knee extension 4+ 4+  Ankle dorsiflexion 5 5  Ankle plantarflexion    Ankle inversion    Ankle eversion     (Blank rows = not tested)  FUNCTIONAL TESTS:  05/06/22: Dynamic Gait Index: 18/24  GAIT: Distance walked: in clinic distances  Assistive device utilized: None Level of assistance: Complete Independence Comments: flexed at hips, + trendelenburg    TODAY'S TREATMENT:                                                                                                                               DATE:  05/14/22 TherEx Recumbent bike L4 x 8 min Bridges with L2 band 2 x 10 reps; 3-5 sec hold Clamshell 2x10;  L2 band Seated piriformis stretch 3x30 sec bil Seated hamstring stretch 3x30 sec bil Sit to/from stand x10 reps Seated clamshells L2 band 2x10 Standing hip abduction 2x10 bil Standing hip extension 2x10 bil Calf raises 2x10; light UE support Mini squats 2x10   Eval Objective measures + appropriate education  TherEx  Nustep L4x6 minutes BLEs only Bridges + red TB x5 Supine clams red TB x5 Piriformis stretch x30 seconds B HS stretch x30 seconds B    PATIENT EDUCATION:  Education details: exam findings, POC, HEP  Person educated: Patient Education method: Programmer, multimedia, Demonstration, and Handouts Education comprehension: verbalized understanding, returned demonstration, and needs further education  HOME EXERCISE PROGRAM: Access Code: [email or web address] URL: [email or web address] Date: 05/06/2022 Prepared by: Nedra Hai  Exercises - Supine Bridge with Resistance Band  - 2 x daily - 7 x weekly - 1 sets - 10 reps - 3 hold - Hooklying Clamshell with Resistance  - 2 x daily - 7 x weekly - 1 sets - 10 reps - 3 hold - Seated Figure 4 Piriformis Stretch  - 2 x daily - 7 x weekly - 1 sets - 3 reps - 30 hold - Seated Hamstring Stretch  - 2 x daily - 7 x weekly - 1 sets - 3 reps - 30 hold  ASSESSMENT:  CLINICAL IMPRESSION: Session today focused on LE strengthening with good tolerance and expected fatigue.  All goals ongoing at this time.   OBJECTIVE IMPAIRMENTS: Abnormal gait, decreased balance, difficulty walking, decreased strength, increased fascial restrictions, increased muscle spasms, impaired flexibility, improper body mechanics, postural dysfunction, and pain.   ACTIVITY LIMITATIONS: standing, squatting, stairs, transfers, and locomotion level  PARTICIPATION LIMITATIONS: cleaning, laundry, driving, shopping, community activity,  occupation, and yard work  PERSONAL FACTORS: Age, Fitness, Past/current experiences, and Time since onset of injury/illness/exacerbation are also affecting patient's functional outcome.   REHAB POTENTIAL: Good  CLINICAL DECISION MAKING: Stable/uncomplicated  EVALUATION COMPLEXITY: Low   GOALS: Goals reviewed with patient? Yes  SHORT TERM GOALS: Target date: 05/27/2022   Will be compliant with appropriate progressive HEP  Baseline: Goal status: INITIAL  2.  Pain when laying on right side to be no more than 2/10 at worst  Baseline:  Goal status: INITIAL  3.  Muscle flexibility to be no more than 25% limited in all tight groups  Baseline:  Goal status: INITIAL  4.  Will be independent with soft tissue spasm management techniques including foam roller or tennis ball massage  Baseline:  Goal status: INITIAL   LONG TERM GOALS: Target date: 06/17/2022    MMT to improve by at least 1 grade in all weak groups  Baseline:  Goal status: INITIAL  2.  Will score at least 22/24 on DGI  Baseline:  Goal status: INITIAL  3.  Will be able to ambulate at least 1 mile without increase in pain  Baseline:  Goal status: INITIAL  4.  Will be compliant with appropriate gym based exercise program to maintain functional gains/prevent recurrence of condition  Baseline:  Goal status: INITIAL    PLAN:  PT FREQUENCY: 2x/week  PT DURATION: 6 weeks  PLANNED INTERVENTIONS: Therapeutic exercises, Therapeutic activity, Neuromuscular re-education, Balance training, Gait training, Patient/Family education, Self Care, Joint mobilization, Stair training, Aquatic Therapy, Dry Needling, Electrical stimulation, Cryotherapy, Moist heat, Taping, Ultrasound, Ionotophoresis 4mg /ml Dexamethasone, Manual therapy, and Re-evaluation  PLAN FOR NEXT SESSION: review/update HEP PRN, general strength and flexibility, consider DN, balance training   NEXT MD VISIT: Dr. Magnus Ivan May 6th  Clarita Crane,  PT, DPT 05/14/22 9:24 AM

## 2022-05-19 DIAGNOSIS — E7800 Pure hypercholesterolemia, unspecified: Secondary | ICD-10-CM | POA: Diagnosis not present

## 2022-05-19 DIAGNOSIS — E039 Hypothyroidism, unspecified: Secondary | ICD-10-CM | POA: Diagnosis not present

## 2022-05-19 DIAGNOSIS — Z0000 Encounter for general adult medical examination without abnormal findings: Secondary | ICD-10-CM | POA: Diagnosis not present

## 2022-05-20 ENCOUNTER — Ambulatory Visit: Payer: Medicare Other | Admitting: Physical Therapy

## 2022-05-20 ENCOUNTER — Encounter: Payer: Self-pay | Admitting: Physical Therapy

## 2022-05-20 DIAGNOSIS — M79604 Pain in right leg: Secondary | ICD-10-CM

## 2022-05-20 DIAGNOSIS — R29898 Other symptoms and signs involving the musculoskeletal system: Secondary | ICD-10-CM

## 2022-05-20 DIAGNOSIS — M6281 Muscle weakness (generalized): Secondary | ICD-10-CM | POA: Diagnosis not present

## 2022-05-20 NOTE — Therapy (Signed)
OUTPATIENT PHYSICAL THERAPY TREATMENT   Patient Name: Anna [name] MRN: [id number] DOB:1946-02-01, 77 y.o., female Today's Date: 05/20/2022  END OF SESSION:  PT End of Session - 05/20/22 1103     Visit Number 3    Number of Visits 13    Date for PT Re-Evaluation 06/17/22    Authorization Type [employer] MCR    Authorization Time Period 05/06/22 to 06/17/22    Progress Note Due on Visit 10    PT Start Time 1100    PT Stop Time 1140    PT Time Calculation (min) 40 min    Activity Tolerance Patient tolerated treatment well    Behavior During Therapy WFL for tasks assessed/performed               Past Medical History:  Diagnosis Date   Arthritis    Breast cancer    age 36   Family history of adverse reaction to anesthesia    Son takes longer to wake   Headache    Hyperlipidemia    Osteopenia    Vision abnormalities    Past Surgical History:  Procedure Laterality Date   BACK SURGERY     lumbar region   BUNIONECTOMY Bilateral    MASTECTOMY Left    at age 61   TOTAL HIP ARTHROPLASTY Right 04/22/2021   Procedure: Right TOTAL HIP ARTHROPLASTY ANTERIOR APPROACH;  Surgeon: Kathryne Hitch, MD;  Location: [facility] OR;  Service: Orthopedics;  Laterality: Right;   TRIGGER FINGER RELEASE Left    left thumb   TUBAL LIGATION     Patient Active Problem List   Diagnosis Date Noted   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[facility]) joint of left thumb 06/10/2021   Digital mucous cyst of finger 06/10/2021   Status post total replacement of right hip 04/22/2021   Mixed hyperlipidemia 02/06/2021   Musculoskeletal chest pain 02/06/2021   Obesity (BMI 30-39.9) 02/06/2021   Unilateral primary osteoarthritis, right hip 11/06/2020   Unilateral primary osteoarthritis, right knee 11/06/2020   Chest pain of uncertain etiology 08/29/2020   Cardiac murmur 08/29/2020   Mixed dyslipidemia 08/29/2020   Arthritis 08/16/2020   Breast cancer 08/16/2020   Headache 08/16/2020   Osteopenia 08/16/2020    Vision abnormalities 08/16/2020   Chronic right shoulder pain 03/07/2018   Chronic migraine 08/11/2017    PCP: Merri Brunette MD   REFERRING PROVIDER: Kathryne Hitch, MD  REFERRING DIAG: [phone] (ICD-10-CM) - History of right hip replacement  THERAPY DIAG:  Pain in right leg  Other symptoms and signs involving the musculoskeletal system  Muscle weakness (generalized)  Rationale for Evaluation and Treatment: Rehabilitation  ONSET DATE: 04/27/2022  SUBJECTIVE:   SUBJECTIVE STATEMENT:  Just feeling OK, the hip is still sore and still has the tender spots and its hard to lay on it, but its not as bad.   PERTINENT HISTORY: The patient comes in today at around a year status post a right total hip arthroplasty.  She still has some pain over the IT band and some in the groin but mainly just her incision.  She is an active 77 year old female.  She is walking without assistive device.  She actually was seen in the office in early February and x-rays were obtained then of her pelvis and right hip and I did see these and it showed a well-seated implant with no complicating features.   On exam her right hip moves smoothly and fluidly.  Her pain is mainly over the IT band and  the trochanteric area.  There is a little bit of pain in the groin but this is more incisional pain.  She has tried Voltaren gel for this.   I would like to send her to outpatient physical therapy for any modalities that the therapist can try on her right hip IT band and groin area with no restrictions from an orthopedic standpoint.  I will try a steroid taper as well and would like to see her back in about 6 weeks.  She agrees with this treatment plan.   PAIN:  Are you having pain? No: NPRS scale: 0/10 Pain location:  Pain description:  Aggravating factors:  Relieving factors:   PRECAUTIONS: None  WEIGHT BEARING RESTRICTIONS: No  FALLS:  Has patient fallen in last 6 months? No  LIVING  ENVIRONMENT: Lives with: lives alone Lives in: House/apartment Stairs: 4-5 steps in front but doesn't usually go this way, preferred entrance has 1 step  Has following equipment at home:  hurry-cane   OCCUPATION: retired   PLOF: Independent, Independent with basic ADLs, Independent with gait, and Independent with transfers  PATIENT GOALS: get rid of pain, be able to lay on right side comfortably   OBJECTIVE:   DIAGNOSTIC FINDINGS: Radiographs AP pelvis right hip demonstrate findings consistent with right  hip replacement.  Good congruent spacing between the acetabular and  femoral head components.  No evidence of any loosening or periprosthetic  fracture  PATIENT SURVEYS:  05/06/22: FOTO 77, predicted 77   SENSATION: Not tested "maybe some numbness down the outside of that leg"   EDEMA:  Reports some edema around hip   MUSCLE LENGTH:  Quads: severe limitation B  Hip flexors: moderate limitation B Hamstrings: moderate limitation B Piriformis: severe limitation R, moderate limitation L   POSTURE: rounded shoulders, forward head, increased thoracic kyphosis, and flexed trunk   PALPATION: Upper R glute tender and with some deep trigger points, very TTP lateral R thigh   LOWER EXTREMITY MMT:  MMT Right eval Left eval  Hip flexion 3- 3-  Hip extension 3- 3-  Hip abduction 4 3+  Hip adduction    Hip internal rotation    Hip external rotation    Knee flexion 4 4  Knee extension 4+ 4+  Ankle dorsiflexion 5 5  Ankle plantarflexion    Ankle inversion    Ankle eversion     (Blank rows = not tested)  FUNCTIONAL TESTS:  05/06/22: Dynamic Gait Index: 18/24  GAIT: Distance walked: in clinic distances  Assistive device utilized: None Level of assistance: Complete Independence Comments: flexed at hips, + trendelenburg    TODAY'S TREATMENT:                                                                                                                              DATE:    05/20/22  TherEx  Nustep L4x6 minutes BLEs only Bridges with red TB x15 with 2 second holds  Sidelying  hip ABD x10 B no resistance  Walking bridges x10  Supine HS stretches 3x30 seconds B Supine figure 4 stretch 3x30 seconds B Lumbar rotation stretches 5x5 seconds B Forward step ups 6 inch box x12 B no UEs Lateral step ups 6 inch box x12 B no UEs  Hip hikes x12 B     05/14/22 TherEx Recumbent bike L4 x 8 min Bridges with L2 band 2 x 10 reps; 3-5 sec hold Clamshell 2x10; L2 band Seated piriformis stretch 3x30 sec bil Seated hamstring stretch 3x30 sec bil Sit to/from stand x10 reps Seated clamshells L2 band 2x10 Standing hip abduction 2x10 bil Standing hip extension 2x10 bil Calf raises 2x10; light UE support Mini squats 2x10   Eval Objective measures + appropriate education  TherEx  Nustep L4x6 minutes BLEs only Bridges + red TB x5 Supine clams red TB x5 Piriformis stretch x30 seconds B HS stretch x30 seconds B    PATIENT EDUCATION:  Education details: exam findings, POC, HEP  Person educated: Patient Education method: Programmer, multimedia, Demonstration, and Handouts Education comprehension: verbalized understanding, returned demonstration, and needs further education  HOME EXERCISE PROGRAM: Access Code: [email or web address] URL: [email or web address] Date: 05/06/2022 Prepared by: Nedra Hai  Exercises - Supine Bridge with Resistance Band  - 2 x daily - 7 x weekly - 1 sets - 10 reps - 3 hold - Hooklying Clamshell with Resistance  - 2 x daily - 7 x weekly - 1 sets - 10 reps - 3 hold - Seated Figure 4 Piriformis Stretch  - 2 x daily - 7 x weekly - 1 sets - 3 reps - 30 hold - Seated Hamstring Stretch  - 2 x daily - 7 x weekly - 1 sets - 3 reps - 30 hold  ASSESSMENT:  CLINICAL IMPRESSION:  Doing OK, still having a lot of tenderness in her hip but overall things seem to be improving per her report. Continued progression of functional strength and  flexibility as able with good tolerance.   OBJECTIVE IMPAIRMENTS: Abnormal gait, decreased balance, difficulty walking, decreased strength, increased fascial restrictions, increased muscle spasms, impaired flexibility, improper body mechanics, postural dysfunction, and pain.   ACTIVITY LIMITATIONS: standing, squatting, stairs, transfers, and locomotion level  PARTICIPATION LIMITATIONS: cleaning, laundry, driving, shopping, community activity, occupation, and yard work  PERSONAL FACTORS: Age, Fitness, Past/current experiences, and Time since onset of injury/illness/exacerbation are also affecting patient's functional outcome.   REHAB POTENTIAL: Good  CLINICAL DECISION MAKING: Stable/uncomplicated  EVALUATION COMPLEXITY: Low   GOALS: Goals reviewed with patient? Yes  SHORT TERM GOALS: Target date: 05/27/2022   Will be compliant with appropriate progressive HEP  Baseline: Goal status: INITIAL  2.  Pain when laying on right side to be no more than 2/10 at worst  Baseline:  Goal status: INITIAL  3.  Muscle flexibility to be no more than 25% limited in all tight groups  Baseline:  Goal status: INITIAL  4.  Will be independent with soft tissue spasm management techniques including foam roller or tennis ball massage  Baseline:  Goal status: INITIAL   LONG TERM GOALS: Target date: 06/17/2022    MMT to improve by at least 1 grade in all weak groups  Baseline:  Goal status: INITIAL  2.  Will score at least 22/24 on DGI  Baseline:  Goal status: INITIAL  3.  Will be able to ambulate at least 1 mile without increase in pain  Baseline:  Goal status: INITIAL  4.  Will be compliant with appropriate gym  based exercise program to maintain functional gains/prevent recurrence of condition  Baseline:  Goal status: INITIAL    PLAN:  PT FREQUENCY: 2x/week  PT DURATION: 6 weeks  PLANNED INTERVENTIONS: Therapeutic exercises, Therapeutic activity, Neuromuscular re-education,  Balance training, Gait training, Patient/Family education, Self Care, Joint mobilization, Stair training, Aquatic Therapy, Dry Needling, Electrical stimulation, Cryotherapy, Moist heat, Taping, Ultrasound, Ionotophoresis 4mg /ml Dexamethasone, Manual therapy, and Re-evaluation  PLAN FOR NEXT SESSION: update HEP PRN, general strength and flexibility, consider DN, balance training   NEXT MD VISIT: Dr. Magnus Ivan May 6th   Nedra Hai PT DPT PN2

## 2022-05-22 ENCOUNTER — Encounter: Payer: Self-pay | Admitting: Physical Therapy

## 2022-05-22 ENCOUNTER — Ambulatory Visit: Payer: Medicare Other | Admitting: Physical Therapy

## 2022-05-22 DIAGNOSIS — M6281 Muscle weakness (generalized): Secondary | ICD-10-CM | POA: Diagnosis not present

## 2022-05-22 DIAGNOSIS — R2681 Unsteadiness on feet: Secondary | ICD-10-CM

## 2022-05-22 DIAGNOSIS — R29898 Other symptoms and signs involving the musculoskeletal system: Secondary | ICD-10-CM | POA: Diagnosis not present

## 2022-05-22 DIAGNOSIS — M79604 Pain in right leg: Secondary | ICD-10-CM

## 2022-05-22 NOTE — Therapy (Signed)
OUTPATIENT PHYSICAL THERAPY TREATMENT   Patient Name: Anna [name] MRN: [id number] DOB:20-Nov-1945, 77 y.o., female Today's Date: 05/22/2022  END OF SESSION:  PT End of Session - 05/22/22 0931     Visit Number 4    Number of Visits 13    Date for PT Re-Evaluation 06/17/22    Authorization Type [employer] MCR    Authorization Time Period 05/06/22 to 06/17/22    Progress Note Due on Visit 10    PT Start Time 0928    PT Stop Time 1011    PT Time Calculation (min) 43 min    Activity Tolerance Patient tolerated treatment well    Behavior During Therapy [facility] for tasks assessed/performed                Past Medical History:  Diagnosis Date   Arthritis    Breast cancer    age 77   Family history of adverse reaction to anesthesia    Son takes longer to wake   Headache    Hyperlipidemia    Osteopenia    Vision abnormalities    Past Surgical History:  Procedure Laterality Date   BACK SURGERY     lumbar region   BUNIONECTOMY Bilateral    MASTECTOMY Left    at age 28   TOTAL HIP ARTHROPLASTY Right 04/22/2021   Procedure: Right TOTAL HIP ARTHROPLASTY ANTERIOR APPROACH;  Surgeon: Kathryne Hitch, MD;  Location: [facility] OR;  Service: Orthopedics;  Laterality: Right;   TRIGGER FINGER RELEASE Left    left thumb   TUBAL LIGATION     Patient Active Problem List   Diagnosis Date Noted   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[facility]) joint of left thumb 06/10/2021   Digital mucous cyst of finger 06/10/2021   Status post total replacement of right hip 04/22/2021   Mixed hyperlipidemia 02/06/2021   Musculoskeletal chest pain 02/06/2021   Obesity (BMI 30-39.9) 02/06/2021   Unilateral primary osteoarthritis, right hip 11/06/2020   Unilateral primary osteoarthritis, right knee 11/06/2020   Chest pain of uncertain etiology 08/29/2020   Cardiac murmur 08/29/2020   Mixed dyslipidemia 08/29/2020   Arthritis 08/16/2020   Breast cancer 08/16/2020   Headache 08/16/2020   Osteopenia 08/16/2020    Vision abnormalities 08/16/2020   Chronic right shoulder pain 03/07/2018   Chronic migraine 08/11/2017    PCP: Merri Brunette MD   REFERRING PROVIDER: Kathryne Hitch, MD  REFERRING DIAG: [id number] (ICD-10-CM) - History of right hip replacement  THERAPY DIAG:  Pain in right leg  Other symptoms and signs involving the musculoskeletal system  Muscle weakness (generalized)  Unsteadiness on feet  Rationale for Evaluation and Treatment: Rehabilitation  ONSET DATE: 04/27/2022  SUBJECTIVE:   SUBJECTIVE STATEMENT:  Feeling a little better today, still having some pain at night but its OK.   PERTINENT HISTORY: The patient comes in today at around a year status post a right total hip arthroplasty.  She still has some pain over the IT band and some in the groin but mainly just her incision.  She is an active 77 year old female.  She is walking without assistive device.  She actually was seen in the office in early February and x-rays were obtained then of her pelvis and right hip and I did see these and it showed a well-seated implant with no complicating features.   On exam her right hip moves smoothly and fluidly.  Her pain is mainly over the IT band and the trochanteric area.  There is a  little bit of pain in the groin but this is more incisional pain.  She has tried Voltaren gel for this.   I would like to send her to outpatient physical therapy for any modalities that the therapist can try on her right hip IT band and groin area with no restrictions from an orthopedic standpoint.  I will try a steroid taper as well and would like to see her back in about 6 weeks.  She agrees with this treatment plan.   PAIN:  Are you having pain?  NPRS scale: 1/10 Pain location: right hip  Pain description: dull Aggravating factors: none Relieving factors: ignore it, tylenol   PRECAUTIONS: None  WEIGHT BEARING RESTRICTIONS: No  FALLS:  Has patient fallen in last 6 months? No  LIVING  ENVIRONMENT: Lives with: lives alone Lives in: House/apartment Stairs: 4-5 steps in front but doesn't usually go this way, preferred entrance has 1 step  Has following equipment at home:  hurry-cane   OCCUPATION: retired   PLOF: Independent, Independent with basic ADLs, Independent with gait, and Independent with transfers  PATIENT GOALS: get rid of pain, be able to lay on right side comfortably   OBJECTIVE:   DIAGNOSTIC FINDINGS: Radiographs AP pelvis right hip demonstrate findings consistent with right  hip replacement.  Good congruent spacing between the acetabular and  femoral head components.  No evidence of any loosening or periprosthetic  fracture  PATIENT SURVEYS:  05/06/22: FOTO 77, predicted 77   SENSATION: Not tested "maybe some numbness down the outside of that leg"   EDEMA:  Reports some edema around hip   MUSCLE LENGTH:  Quads: severe limitation B  Hip flexors: moderate limitation B Hamstrings: moderate limitation B Piriformis: severe limitation R, moderate limitation L   POSTURE: rounded shoulders, forward head, increased thoracic kyphosis, and flexed trunk   PALPATION: Upper R glute tender and with some deep trigger points, very TTP lateral R thigh   LOWER EXTREMITY MMT:  MMT Right eval Left eval Right 05/22/22 Left 05/22/22  Hip flexion 3- 3- 4 4  Hip extension 3- 3- 3- 3-  Hip abduction 4 3+ 4+ 3  Hip adduction      Hip internal rotation      Hip external rotation      Knee flexion 4 4 4+ 4+  Knee extension 4+ 4+ 4+ 4+  Ankle dorsiflexion 5 5    Ankle plantarflexion      Ankle inversion      Ankle eversion       (Blank rows = not tested)  FUNCTIONAL TESTS:  05/06/22: Dynamic Gait Index: 18/24  GAIT: Distance walked: in clinic distances  Assistive device utilized: None Level of assistance: Complete Independence Comments: flexed at hips, + trendelenburg    TODAY'S TREATMENT:  DATE:    05/22/22  MMT/brief goal review   TherEx  Nustep L5 x6 minute BLEs only Staggered bridges x10 B (progressing to single leg bridge) Walking bridges x10  Lumbar rotation stretch 5x5 seconds B Supine figure 4 stretch 3x30 seconds B Standing 3D hip excursions x20 each direction Hip hikes x15 B Mini squats in front of mat table x10 cues for form Forward step ups 6 inch box x15 B Tandem stance solid surface 2x30 seconds B    05/20/22  TherEx  Nustep L4x6 minutes BLEs only Bridges with red TB x15 with 2 second holds  Sidelying hip ABD x10 B no resistance  Walking bridges x10  Supine HS stretches 3x30 seconds B Supine figure 4 stretch 3x30 seconds B Lumbar rotation stretches 5x5 seconds B Forward step ups 6 inch box x12 B no UEs Lateral step ups 6 inch box x12 B no UEs  Hip hikes x12 B     05/14/22 TherEx Recumbent bike L4 x 8 min Bridges with L2 band 2 x 10 reps; 3-5 sec hold Clamshell 2x10; L2 band Seated piriformis stretch 3x30 sec bil Seated hamstring stretch 3x30 sec bil Sit to/from stand x10 reps Seated clamshells L2 band 2x10 Standing hip abduction 2x10 bil Standing hip extension 2x10 bil Calf raises 2x10; light UE support Mini squats 2x10   Eval Objective measures + appropriate education  TherEx  Nustep L4x6 minutes BLEs only Bridges + red TB x5 Supine clams red TB x5 Piriformis stretch x30 seconds B HS stretch x30 seconds B    PATIENT EDUCATION:  Education details: exam findings, POC, HEP  Person educated: Patient Education method: Programmer, multimedia, Demonstration, and Handouts Education comprehension: verbalized understanding, returned demonstration, and needs further education  HOME EXERCISE PROGRAM:  Access Code: [email or web address] URL: [email or web address] Date: 05/22/2022 Prepared by: Nedra Hai  Exercises - Supine Bridge with Resistance Band  - 2 x daily - 7 x  weekly - 1 sets - 10 reps - 3 hold - Hooklying Clamshell with Resistance  - 2 x daily - 7 x weekly - 1 sets - 10 reps - 3 hold - Seated Figure 4 Piriformis Stretch  - 2 x daily - 7 x weekly - 1 sets - 3 reps - 30 hold - Seated Hamstring Stretch  - 2 x daily - 7 x weekly - 1 sets - 3 reps - 30 hold - Staggered Bridge  - 2 x daily - 7 x weekly - 1 sets - 10 reps - 2 hold - Bridge Walk Out  - 2 x daily - 7 x weekly - 1 sets - 10 reps - 3 hold  ASSESSMENT:  CLINICAL IMPRESSION:  Zehava arrives today doing OK, feeling better since last session but still needing tylenol to help manage pain. Updated strength measures and some goals today, otherwise progressed interventions as able. Also updated HEP for increased focus on hip extensors and stabilizers. Will continue as per POC.   OBJECTIVE IMPAIRMENTS: Abnormal gait, decreased balance, difficulty walking, decreased strength, increased fascial restrictions, increased muscle spasms, impaired flexibility, improper body mechanics, postural dysfunction, and pain.   ACTIVITY LIMITATIONS: standing, squatting, stairs, transfers, and locomotion level  PARTICIPATION LIMITATIONS: cleaning, laundry, driving, shopping, community activity, occupation, and yard work  PERSONAL FACTORS: Age, Fitness, Past/current experiences, and Time since onset of injury/illness/exacerbation are also affecting patient's functional outcome.   REHAB POTENTIAL: Good  CLINICAL DECISION MAKING: Stable/uncomplicated  EVALUATION COMPLEXITY: Low   GOALS: Goals reviewed with patient? Yes  SHORT TERM GOALS: Target  date: 05/27/2022   Will be compliant with appropriate progressive HEP  Baseline: Goal status: MET 05/22/22  2.  Pain when laying on right side to be no more than 2/10 at worst  Baseline:  Goal status: IN PROGRESS 05/22/22 up to 4/10 at worst   3.  Muscle flexibility to be no more than 25% limited in all tight groups  Baseline:  Goal status: INITIAL  4.  Will be  independent with soft tissue spasm management techniques including foam roller or tennis ball massage  Baseline:  Goal status: INITIAL   LONG TERM GOALS: Target date: 06/17/2022    MMT to improve by at least 1 grade in all weak groups  Baseline:  Goal status: IN PROGRESS 05/22/22  2.  Will score at least 22/24 on DGI  Baseline:  Goal status: INITIAL  3.  Will be able to ambulate at least 1 mile without increase in pain  Baseline:  Goal status: INITIAL  4.  Will be compliant with appropriate gym based exercise program to maintain functional gains/prevent recurrence of condition  Baseline:  Goal status: INITIAL    PLAN:  PT FREQUENCY: 2x/week  PT DURATION: 6 weeks  PLANNED INTERVENTIONS: Therapeutic exercises, Therapeutic activity, Neuromuscular re-education, Balance training, Gait training, Patient/Family education, Self Care, Joint mobilization, Stair training, Aquatic Therapy, Dry Needling, Electrical stimulation, Cryotherapy, Moist heat, Taping, Ultrasound, Ionotophoresis 4mg /ml Dexamethasone, Manual therapy, and Re-evaluation  PLAN FOR NEXT SESSION:  general strength and flexibility, consider DN, balance training   NEXT MD VISIT: Dr. Magnus Ivan May 6th   Nedra Hai PT DPT PN2

## 2022-05-26 DIAGNOSIS — K219 Gastro-esophageal reflux disease without esophagitis: Secondary | ICD-10-CM | POA: Diagnosis not present

## 2022-05-26 DIAGNOSIS — E559 Vitamin D deficiency, unspecified: Secondary | ICD-10-CM | POA: Diagnosis not present

## 2022-05-26 DIAGNOSIS — E7800 Pure hypercholesterolemia, unspecified: Secondary | ICD-10-CM | POA: Diagnosis not present

## 2022-05-26 DIAGNOSIS — D508 Other iron deficiency anemias: Secondary | ICD-10-CM | POA: Diagnosis not present

## 2022-05-26 DIAGNOSIS — E039 Hypothyroidism, unspecified: Secondary | ICD-10-CM | POA: Diagnosis not present

## 2022-05-27 ENCOUNTER — Encounter: Payer: Self-pay | Admitting: Physical Therapy

## 2022-05-27 ENCOUNTER — Ambulatory Visit: Payer: Medicare Other | Admitting: Physical Therapy

## 2022-05-27 DIAGNOSIS — R29898 Other symptoms and signs involving the musculoskeletal system: Secondary | ICD-10-CM | POA: Diagnosis not present

## 2022-05-27 DIAGNOSIS — M6281 Muscle weakness (generalized): Secondary | ICD-10-CM

## 2022-05-27 DIAGNOSIS — M79604 Pain in right leg: Secondary | ICD-10-CM

## 2022-05-27 DIAGNOSIS — R2681 Unsteadiness on feet: Secondary | ICD-10-CM

## 2022-05-27 NOTE — Therapy (Signed)
OUTPATIENT PHYSICAL THERAPY TREATMENT   Patient Name: Anna [name] MRN: [id number] DOB:[birth year], 77 y.o., female Today's Date: 05/27/2022  END OF SESSION:  PT End of Session - 05/27/22 0842     Visit Number 5    Number of Visits 13    Date for PT Re-Evaluation 06/17/22    Authorization Type UHC MCR    Authorization Time Period 05/06/22 to 06/17/22    Progress Note Due on Visit 10    PT Start Time 0845    PT Stop Time 0925    PT Time Calculation (min) 40 min    Activity Tolerance Patient tolerated treatment well    Behavior During Therapy [facility] for tasks assessed/performed                 Past Medical History:  Diagnosis Date   Arthritis    Breast cancer    age 40   Family history of adverse reaction to anesthesia    Son takes longer to wake   Headache    Hyperlipidemia    Osteopenia    Vision abnormalities    Past Surgical History:  Procedure Laterality Date   BACK SURGERY     lumbar region   BUNIONECTOMY Bilateral    MASTECTOMY Left    at age 60   TOTAL HIP ARTHROPLASTY Right 04/22/2021   Procedure: Right TOTAL HIP ARTHROPLASTY ANTERIOR APPROACH;  Surgeon: Kathryne Hitch, MD;  Location: [facility] OR;  Service: Orthopedics;  Laterality: Right;   TRIGGER FINGER RELEASE Left    left thumb   TUBAL LIGATION     Patient Active Problem List   Diagnosis Date Noted   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(CMC) joint of left thumb 06/10/2021   Digital mucous cyst of finger 06/10/2021   Status post total replacement of right hip 04/22/2021   Mixed hyperlipidemia 02/06/2021   Musculoskeletal chest pain 02/06/2021   Obesity (BMI 30-39.9) 02/06/2021   Unilateral primary osteoarthritis, right hip 11/06/2020   Unilateral primary osteoarthritis, right knee 11/06/2020   Chest pain of uncertain etiology 08/29/2020   Cardiac murmur 08/29/2020   Mixed dyslipidemia 08/29/2020   Arthritis 08/16/2020   Breast cancer 08/16/2020   Headache 08/16/2020   Osteopenia 08/16/2020    Vision abnormalities 08/16/2020   Chronic right shoulder pain 03/07/2018   Chronic migraine 08/11/2017    PCP: Merri Brunette MD   REFERRING PROVIDER: Kathryne Hitch, MD  REFERRING DIAG: [phone] (ICD-10-CM) - History of right hip replacement  THERAPY DIAG:  Pain in right leg  Other symptoms and signs involving the musculoskeletal system  Muscle weakness (generalized)  Unsteadiness on feet  Rationale for Evaluation and Treatment: Rehabilitation  ONSET DATE: 04/27/2022  SUBJECTIVE:   SUBJECTIVE STATEMENT: Tightness seems to be largely resolved  PERTINENT HISTORY: The patient comes in today at around a year status post a right total hip arthroplasty.  She still has some pain over the IT band and some in the groin but mainly just her incision.  She is an active 77 year old female.  She is walking without assistive device.  She actually was seen in the office in early February and x-rays were obtained then of her pelvis and right hip and I did see these and it showed a well-seated implant with no complicating features.   On exam her right hip moves smoothly and fluidly.  Her pain is mainly over the IT band and the trochanteric area.  There is a little bit of pain in the groin but this  is more incisional pain.  She has tried Voltaren gel for this.   I would like to send her to outpatient physical therapy for any modalities that the therapist can try on her right hip IT band and groin area with no restrictions from an orthopedic standpoint.  I will try a steroid taper as well and would like to see her back in about 6 weeks.  She agrees with this treatment plan.   PAIN:  Are you having pain?  NPRS scale: 1/10 Pain location: right hip  Pain description: dull Aggravating factors: none Relieving factors: ignore it, tylenol   PRECAUTIONS: None  WEIGHT BEARING RESTRICTIONS: No  FALLS:  Has patient fallen in last 6 months? No  LIVING ENVIRONMENT: Lives with: lives  alone Lives in: House/apartment Stairs: 4-5 steps in front but doesn't usually go this way, preferred entrance has 1 step  Has following equipment at home:  hurry-cane   OCCUPATION: retired   PLOF: Independent, Independent with basic ADLs, Independent with gait, and Independent with transfers  PATIENT GOALS: get rid of pain, be able to lay on right side comfortably   OBJECTIVE:   DIAGNOSTIC FINDINGS: Radiographs AP pelvis right hip demonstrate findings consistent with right  hip replacement.  Good congruent spacing between the acetabular and  femoral head components.  No evidence of any loosening or periprosthetic  fracture  PATIENT SURVEYS:  05/06/22: FOTO 77, predicted 77   SENSATION: Not tested "maybe some numbness down the outside of that leg"   EDEMA:  Reports some edema around hip   MUSCLE LENGTH: 05/27/22: Hamstrings: mild limitation bil Piriformis: mild limitation bil    EVAL: Quads: severe limitation B  Hip flexors: moderate limitation B Hamstrings: moderate limitation B Piriformis: severe limitation R, moderate limitation L   POSTURE: rounded shoulders, forward head, increased thoracic kyphosis, and flexed trunk   PALPATION: Upper R glute tender and with some deep trigger points, very TTP lateral R thigh   LOWER EXTREMITY MMT:  MMT Right eval Left eval Right 05/22/22 Left 05/22/22  Hip flexion 3- 3- 4 4  Hip extension 3- 3- 3- 3-  Hip abduction 4 3+ 4+ 3  Hip adduction      Hip internal rotation      Hip external rotation      Knee flexion 4 4 4+ 4+  Knee extension 4+ 4+ 4+ 4+  Ankle dorsiflexion 5 5    Ankle plantarflexion      Ankle inversion      Ankle eversion       (Blank rows = not tested)  FUNCTIONAL TESTS:  05/06/22: Dynamic Gait Index: 18/24  GAIT: Distance walked: in clinic distances  Assistive device utilized: None Level of assistance: Complete Independence Comments: flexed at hips, + trendelenburg    TODAY'S TREATMENT:  DATE:  05/27/22 TherEx NuStep L6 x 5 min Dynamic hamstring stretch 10 x 5 sec hold in supine bil Dynamic piriformis stretch in supine 10 x 5 sec hold bil Bridges x 10 reps; 5 sec hold Lower trunk rotation 5x5 sec hold bil Sit to/from stand x 10 reps; no UE support Side stepping at counter without UE support L3 band x 5 laps Standing hip extension L3 band 2x10 bil Mini squats at counter 2x10 Calf raises x 20 reps Leg Press 106# 2x10 Demonstrated use of tennis ball for self mobilization   05/22/22 MMT/brief goal review   TherEx Nustep L5 x6 minute BLEs only Staggered bridges x10 B (progressing to single leg bridge) Walking bridges x10  Lumbar rotation stretch 5x5 seconds B Supine figure 4 stretch 3x30 seconds B Standing 3D hip excursions x20 each direction Hip hikes x15 B Mini squats in front of mat table x10 cues for form Forward step ups 6 inch box x15 B Tandem stance solid surface 2x30 seconds B    05/20/22  TherEx  Nustep L4x6 minutes BLEs only Bridges with red TB x15 with 2 second holds  Sidelying hip ABD x10 B no resistance  Walking bridges x10  Supine HS stretches 3x30 seconds B Supine figure 4 stretch 3x30 seconds B Lumbar rotation stretches 5x5 seconds B Forward step ups 6 inch box x12 B no UEs Lateral step ups 6 inch box x12 B no UEs  Hip hikes x12 B     05/14/22 TherEx Recumbent bike L4 x 8 min Bridges with L2 band 2 x 10 reps; 3-5 sec hold Clamshell 2x10; L2 band Seated piriformis stretch 3x30 sec bil Seated hamstring stretch 3x30 sec bil Sit to/from stand x10 reps Seated clamshells L2 band 2x10 Standing hip abduction 2x10 bil Standing hip extension 2x10 bil Calf raises 2x10; light UE support Mini squats 2x10   Eval Objective measures + appropriate education  TherEx  Nustep L4x6 minutes BLEs only Bridges + red TB x5 Supine  clams red TB x5 Piriformis stretch x30 seconds B HS stretch x30 seconds B    PATIENT EDUCATION:  Education details: exam findings, POC, HEP  Person educated: Patient Education method: Programmer, multimedia, Demonstration, and Handouts Education comprehension: verbalized understanding, returned demonstration, and needs further education  HOME EXERCISE PROGRAM:  Access Code: [email or web address] URL: [email or web address] Date: 05/22/2022 Prepared by: Nedra Hai  Exercises - Supine Bridge with Resistance Band  - 2 x daily - 7 x weekly - 1 sets - 10 reps - 3 hold - Hooklying Clamshell with Resistance  - 2 x daily - 7 x weekly - 1 sets - 10 reps - 3 hold - Seated Figure 4 Piriformis Stretch  - 2 x daily - 7 x weekly - 1 sets - 3 reps - 30 hold - Seated Hamstring Stretch  - 2 x daily - 7 x weekly - 1 sets - 3 reps - 30 hold - Staggered Bridge  - 2 x daily - 7 x weekly - 1 sets - 10 reps - 2 hold - Bridge Walk Out  - 2 x daily - 7 x weekly - 1 sets - 10 reps - 3 hold  ASSESSMENT:  CLINICAL IMPRESSION: Pt tolerated session well today meeting all STGs at this time.  She reports the tightness seems to have largely resolved and overall progressing well with PT.  Will continue to benefit from PT to maximize function.   OBJECTIVE IMPAIRMENTS: Abnormal gait, decreased balance, difficulty walking, decreased strength, increased fascial restrictions, increased  muscle spasms, impaired flexibility, improper body mechanics, postural dysfunction, and pain.   ACTIVITY LIMITATIONS: standing, squatting, stairs, transfers, and locomotion level  PARTICIPATION LIMITATIONS: cleaning, laundry, driving, shopping, community activity, occupation, and yard work  PERSONAL FACTORS: Age, Fitness, Past/current experiences, and Time since onset of injury/illness/exacerbation are also affecting patient's functional outcome.   REHAB POTENTIAL: Good  CLINICAL DECISION MAKING: Stable/uncomplicated  EVALUATION  COMPLEXITY: Low   GOALS: Goals reviewed with patient? Yes  SHORT TERM GOALS: Target date: 05/27/2022   Will be compliant with appropriate progressive HEP  Goal status: MET 05/22/22  2.  Pain when laying on right side to be no more than 2/10 at worst  Goal status: Partially MET 05/27/22; 2-3/10  3.  Muscle flexibility to be no more than 25% limited in all tight groups  Goal status: MET 05/27/22  4.  Will be independent with soft tissue spasm management techniques including foam roller or tennis ball massage  Goal status: MET 05/27/22   LONG TERM GOALS: Target date: 06/17/2022    MMT to improve by at least 1 grade in all weak groups  Baseline:  Goal status: IN PROGRESS 05/22/22  2.  Will score at least 22/24 on DGI  Baseline:  Goal status: INITIAL  3.  Will be able to ambulate at least 1 mile without increase in pain  Baseline:  Goal status: INITIAL  4.  Will be compliant with appropriate gym based exercise program to maintain functional gains/prevent recurrence of condition  Baseline:  Goal status: INITIAL    PLAN:  PT FREQUENCY: 2x/week  PT DURATION: 6 weeks  PLANNED INTERVENTIONS: Therapeutic exercises, Therapeutic activity, Neuromuscular re-education, Balance training, Gait training, Patient/Family education, Self Care, Joint mobilization, Stair training, Aquatic Therapy, Dry Needling, Electrical stimulation, Cryotherapy, Moist heat, Taping, Ultrasound, Ionotophoresis /ml Dexamethasone, Manual therapy, and Re-evaluation  PLAN FOR NEXT SESSION:  continue general strength and flexibility, consider DN, balance training   NEXT MD VISIT: Dr. Magnus Ivan May 6th     Clarita Crane, PT, DPT 05/27/22 9:35 AM

## 2022-05-29 ENCOUNTER — Ambulatory Visit: Payer: Medicare Other | Admitting: Physical Therapy

## 2022-05-29 ENCOUNTER — Encounter: Payer: Self-pay | Admitting: Physical Therapy

## 2022-05-29 DIAGNOSIS — R2681 Unsteadiness on feet: Secondary | ICD-10-CM

## 2022-05-29 DIAGNOSIS — R29898 Other symptoms and signs involving the musculoskeletal system: Secondary | ICD-10-CM

## 2022-05-29 DIAGNOSIS — M79604 Pain in right leg: Secondary | ICD-10-CM | POA: Diagnosis not present

## 2022-05-29 DIAGNOSIS — M6281 Muscle weakness (generalized): Secondary | ICD-10-CM

## 2022-05-29 NOTE — Therapy (Signed)
OUTPATIENT PHYSICAL THERAPY TREATMENT   Patient Name: Anna [name] MRN: [id number] DOB:November 15, 1945, 77 y.o., female Today's Date: 05/29/2022  END OF SESSION:  PT End of Session - 05/29/22 0901     Visit Number 6    Number of Visits 13    Date for PT Re-Evaluation 06/17/22    Authorization Type UHC MCR    Authorization Time Period 05/06/22 to 06/17/22    Progress Note Due on Visit 10    PT Start Time 0848    PT Stop Time 0926    PT Time Calculation (min) 38 min    Activity Tolerance Patient tolerated treatment well    Behavior During Therapy WFL for tasks assessed/performed                  Past Medical History:  Diagnosis Date   Arthritis    Breast cancer [facility])    age 39   Family history of adverse reaction to anesthesia    Son takes longer to wake   Headache    Hyperlipidemia    Osteopenia    Vision abnormalities    Past Surgical History:  Procedure Laterality Date   BACK SURGERY     lumbar region   BUNIONECTOMY Bilateral    MASTECTOMY Left    at age 21   TOTAL HIP ARTHROPLASTY Right 04/22/2021   Procedure: Right TOTAL HIP ARTHROPLASTY ANTERIOR APPROACH;  Surgeon: Kathryne Hitch, MD;  Location: [facility] OR;  Service: Orthopedics;  Laterality: Right;   TRIGGER FINGER RELEASE Left    left thumb   TUBAL LIGATION     Patient Active Problem List   Diagnosis Date Noted   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[facility]) joint of left thumb 06/10/2021   Digital mucous cyst of finger 06/10/2021   Status post total replacement of right hip 04/22/2021   Mixed hyperlipidemia 02/06/2021   Musculoskeletal chest pain 02/06/2021   Obesity (BMI 30-39.9) 02/06/2021   Unilateral primary osteoarthritis, right hip 11/06/2020   Unilateral primary osteoarthritis, right knee 11/06/2020   Chest pain of uncertain etiology 08/29/2020   Cardiac murmur 08/29/2020   Mixed dyslipidemia 08/29/2020   Arthritis 08/16/2020   Breast cancer (HCC) 08/16/2020   Headache 08/16/2020    Osteopenia 08/16/2020   Vision abnormalities 08/16/2020   Chronic right shoulder pain 03/07/2018   Chronic migraine 08/11/2017    PCP: Merri Brunette MD   REFERRING PROVIDER: Kathryne Hitch, MD  REFERRING DIAG: [phone] (ICD-10-CM) - History of right hip replacement  THERAPY DIAG:  Pain in right leg  Other symptoms and signs involving the musculoskeletal system  Muscle weakness (generalized)  Unsteadiness on feet  Rationale for Evaluation and Treatment: Rehabilitation  ONSET DATE: 04/27/2022  SUBJECTIVE:   SUBJECTIVE STATEMENT:  Feeling OK. Had some pain going up my R leg from my knee last time with squats and some other exercises. Still sore in R LE. Nothing else new going on.    PERTINENT HISTORY: The patient comes in today at around a year status post a right total hip arthroplasty.  She still has some pain over the IT band and some in the groin but mainly just her incision.  She is an active 76 year old female.  She is walking without assistive device.  She actually was seen in the office in early February and x-rays were obtained then of her pelvis and right hip and I did see these and it showed a well-seated implant with no complicating features.   On exam her right  hip moves smoothly and fluidly.  Her pain is mainly over the IT band and the trochanteric area.  There is a little bit of pain in the groin but this is more incisional pain.  She has tried Voltaren gel for this.   I would like to send her to outpatient physical therapy for any modalities that the therapist can try on her right hip IT band and groin area with no restrictions from an orthopedic standpoint.  I will try a steroid taper as well and would like to see her back in about 6 weeks.  She agrees with this treatment plan.   PAIN:  Are you having pain?  NPRS scale: 0/10 Pain location:  Pain description:  Aggravating factors:  Relieving factors:    PRECAUTIONS: None  WEIGHT BEARING  RESTRICTIONS: No  FALLS:  Has patient fallen in last 6 months? No  LIVING ENVIRONMENT: Lives with: lives alone Lives in: House/apartment Stairs: 4-5 steps in front but doesn't usually go this way, preferred entrance has 1 step  Has following equipment at home:  hurry-cane   OCCUPATION: retired   PLOF: Independent, Independent with basic ADLs, Independent with gait, and Independent with transfers  PATIENT GOALS: get rid of pain, be able to lay on right side comfortably   OBJECTIVE:   DIAGNOSTIC FINDINGS: Radiographs AP pelvis right hip demonstrate findings consistent with right  hip replacement.  Good congruent spacing between the acetabular and  femoral head components.  No evidence of any loosening or periprosthetic  fracture  PATIENT SURVEYS:  05/06/22: FOTO 77, predicted 77   SENSATION: Not tested "maybe some numbness down the outside of that leg"   EDEMA:  Reports some edema around hip   MUSCLE LENGTH: 05/27/22: Hamstrings: mild limitation bil Piriformis: mild limitation bil    EVAL: Quads: severe limitation B  Hip flexors: moderate limitation B Hamstrings: moderate limitation B Piriformis: severe limitation R, moderate limitation L   POSTURE: rounded shoulders, forward head, increased thoracic kyphosis, and flexed trunk   PALPATION: Upper R glute tender and with some deep trigger points, very TTP lateral R thigh   LOWER EXTREMITY MMT:  MMT Right eval Left eval Right 05/22/22 Left 05/22/22  Hip flexion 3- 3- 4 4  Hip extension 3- 3- 3- 3-  Hip abduction 4 3+ 4+ 3  Hip adduction      Hip internal rotation      Hip external rotation      Knee flexion 4 4 4+ 4+  Knee extension 4+ 4+ 4+ 4+  Ankle dorsiflexion 5 5    Ankle plantarflexion      Ankle inversion      Ankle eversion       (Blank rows = not tested)  FUNCTIONAL TESTS:  05/06/22: Dynamic Gait Index: 18/24  GAIT: Distance walked: in clinic distances  Assistive device utilized: None Level of  assistance: Complete Independence Comments: flexed at hips, + trendelenburg    TODAY'S TREATMENT:  DATE:   05/29/22  TherEx   Scifit bike L6 x6 minutes BLEs only 3D hip excursions x20 each direction  STS no UEs x4-5 (attempted more but limited by R knee pain) Hip hikes x15 B Hip hikes + ABD x12 B Shuttle leg press 106# 2x12 double leg presses; single leg presses 2x10 B 75#    NMR  Tandem stance blue foam pad 3x30 seconds B  Tandem gait solid surface // bars 5 laps  EC blue foam pad 3x30 seconds     05/27/22 TherEx NuStep L6 x 5 min Dynamic hamstring stretch 10 x 5 sec hold in supine bil Dynamic piriformis stretch in supine 10 x 5 sec hold bil Bridges x 10 reps; 5 sec hold Lower trunk rotation 5x5 sec hold bil Sit to/from stand x 10 reps; no UE support Side stepping at counter without UE support L3 band x 5 laps Standing hip extension L3 band 2x10 bil Mini squats at counter 2x10 Calf raises x 20 reps Leg Press 106# 2x10 Demonstrated use of tennis ball for self mobilization   05/22/22 MMT/brief goal review   TherEx Nustep L5 x6 minute BLEs only Staggered bridges x10 B (progressing to single leg bridge) Walking bridges x10  Lumbar rotation stretch 5x5 seconds B Supine figure 4 stretch 3x30 seconds B Standing 3D hip excursions x20 each direction Hip hikes x15 B Mini squats in front of mat table x10 cues for form Forward step ups 6 inch box x15 B Tandem stance solid surface 2x30 seconds B    05/20/22  TherEx  Nustep L4x6 minutes BLEs only Bridges with red TB x15 with 2 second holds  Sidelying hip ABD x10 B no resistance  Walking bridges x10  Supine HS stretches 3x30 seconds B Supine figure 4 stretch 3x30 seconds B Lumbar rotation stretches 5x5 seconds B Forward step ups 6 inch box x12 B no UEs Lateral step ups 6 inch box x12 B no  UEs  Hip hikes x12 B     05/14/22 TherEx Recumbent bike L4 x 8 min Bridges with L2 band 2 x 10 reps; 3-5 sec hold Clamshell 2x10; L2 band Seated piriformis stretch 3x30 sec bil Seated hamstring stretch 3x30 sec bil Sit to/from stand x10 reps Seated clamshells L2 band 2x10 Standing hip abduction 2x10 bil Standing hip extension 2x10 bil Calf raises 2x10; light UE support Mini squats 2x10   Eval Objective measures + appropriate education  TherEx  Nustep L4x6 minutes BLEs only Bridges + red TB x5 Supine clams red TB x5 Piriformis stretch x30 seconds B HS stretch x30 seconds B    PATIENT EDUCATION:  Education details: exam findings, POC, HEP  Person educated: Patient Education method: Programmer, multimedia, Demonstration, and Handouts Education comprehension: verbalized understanding, returned demonstration, and needs further education  HOME EXERCISE PROGRAM:  Access Code: [email or web address] URL: [email or web address] Date: 05/22/2022 Prepared by: Nedra Hai  Exercises - Supine Bridge with Resistance Band  - 2 x daily - 7 x weekly - 1 sets - 10 reps - 3 hold - Hooklying Clamshell with Resistance  - 2 x daily - 7 x weekly - 1 sets - 10 reps - 3 hold - Seated Figure 4 Piriformis Stretch  - 2 x daily - 7 x weekly - 1 sets - 3 reps - 30 hold - Seated Hamstring Stretch  - 2 x daily - 7 x weekly - 1 sets - 3 reps - 30 hold - Staggered Bridge  - 2 x daily - 7 x weekly -  1 sets - 10 reps - 2 hold - Bridge Walk Out  - 2 x daily - 7 x weekly - 1 sets - 10 reps - 3 hold  ASSESSMENT:  CLINICAL IMPRESSION:   [place] arrives today doing OK, had some joint pain with some exercises. Continued working on strength as tolerated today with pain free activities. Will continue efforts.    OBJECTIVE IMPAIRMENTS: Abnormal gait, decreased balance, difficulty walking, decreased strength, increased fascial restrictions, increased muscle spasms, impaired flexibility, improper body mechanics,  postural dysfunction, and pain.   ACTIVITY LIMITATIONS: standing, squatting, stairs, transfers, and locomotion level  PARTICIPATION LIMITATIONS: cleaning, laundry, driving, shopping, community activity, occupation, and yard work  PERSONAL FACTORS: Age, Fitness, Past/current experiences, and Time since onset of injury/illness/exacerbation are also affecting patient's functional outcome.   REHAB POTENTIAL: Good  CLINICAL DECISION MAKING: Stable/uncomplicated  EVALUATION COMPLEXITY: Low   GOALS: Goals reviewed with patient? Yes  SHORT TERM GOALS: Target date: 05/27/2022   Will be compliant with appropriate progressive HEP  Goal status: MET 05/22/22  2.  Pain when laying on right side to be no more than 2/10 at worst  Goal status: Partially MET 05/27/22; 2-3/10  3.  Muscle flexibility to be no more than 25% limited in all tight groups  Goal status: MET 05/27/22  4.  Will be independent with soft tissue spasm management techniques including foam roller or tennis ball massage  Goal status: MET 05/27/22   LONG TERM GOALS: Target date: 06/17/2022    MMT to improve by at least 1 grade in all weak groups  Baseline:  Goal status: IN PROGRESS 05/22/22  2.  Will score at least 22/24 on DGI  Baseline:  Goal status: INITIAL  3.  Will be able to ambulate at least 1 mile without increase in pain  Baseline:  Goal status: INITIAL  4.  Will be compliant with appropriate gym based exercise program to maintain functional gains/prevent recurrence of condition  Baseline:  Goal status: INITIAL    PLAN:  PT FREQUENCY: 2x/week  PT DURATION: 6 weeks  PLANNED INTERVENTIONS: Therapeutic exercises, Therapeutic activity, Neuromuscular re-education, Balance training, Gait training, Patient/Family education, Self Care, Joint mobilization, Stair training, Aquatic Therapy, Dry Needling, Electrical stimulation, Cryotherapy, Moist heat, Taping, Ultrasound, Ionotophoresis 4mg /ml Dexamethasone, Manual  therapy, and Re-evaluation  PLAN FOR NEXT SESSION:  continue general strength and flexibility, consider DN, balance training. Caution with squats/repeated knee flexion movements due to knee pain   NEXT MD VISIT: Dr. Magnus Ivan May 6th    Nedra Hai PT DPT PN2

## 2022-06-01 ENCOUNTER — Encounter: Payer: Self-pay | Admitting: Physical Therapy

## 2022-06-01 ENCOUNTER — Ambulatory Visit: Payer: Medicare Other | Admitting: Physical Therapy

## 2022-06-01 DIAGNOSIS — R2681 Unsteadiness on feet: Secondary | ICD-10-CM | POA: Diagnosis not present

## 2022-06-01 DIAGNOSIS — M79604 Pain in right leg: Secondary | ICD-10-CM | POA: Diagnosis not present

## 2022-06-01 DIAGNOSIS — M6281 Muscle weakness (generalized): Secondary | ICD-10-CM | POA: Diagnosis not present

## 2022-06-01 DIAGNOSIS — R29898 Other symptoms and signs involving the musculoskeletal system: Secondary | ICD-10-CM

## 2022-06-01 NOTE — Therapy (Signed)
OUTPATIENT PHYSICAL THERAPY TREATMENT   Patient Name: Anna [name] MRN: [id number] DOB:December 08, 1945, 77 y.o., female Today's Date: 06/01/2022  END OF SESSION:  PT End of Session - 06/01/22 0903     Visit Number 7    Number of Visits 13    Date for PT Re-Evaluation 06/17/22    Authorization Type [employer] MCR    Authorization Time Period 05/06/22 to 06/17/22    Progress Note Due on Visit 10    PT Start Time 0847    PT Stop Time 0925    PT Time Calculation (min) 38 min    Activity Tolerance Patient tolerated treatment well    Behavior During Therapy [facility] for tasks assessed/performed                   Past Medical History:  Diagnosis Date   Arthritis    Breast cancer [facility])    age 76   Family history of adverse reaction to anesthesia    Son takes longer to wake   Headache    Hyperlipidemia    Osteopenia    Vision abnormalities    Past Surgical History:  Procedure Laterality Date   BACK SURGERY     lumbar region   BUNIONECTOMY Bilateral    MASTECTOMY Left    at age 69   TOTAL HIP ARTHROPLASTY Right 04/22/2021   Procedure: Right TOTAL HIP ARTHROPLASTY ANTERIOR APPROACH;  Surgeon: Kathryne Hitch, MD;  Location: [facility] OR;  Service: Orthopedics;  Laterality: Right;   TRIGGER FINGER RELEASE Left    left thumb   TUBAL LIGATION     Patient Active Problem List   Diagnosis Date Noted   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[facility]) joint of left thumb 06/10/2021   Digital mucous cyst of finger 06/10/2021   Status post total replacement of right hip 04/22/2021   Mixed hyperlipidemia 02/06/2021   Musculoskeletal chest pain 02/06/2021   Obesity (BMI 30-39.9) 02/06/2021   Unilateral primary osteoarthritis, right hip 11/06/2020   Unilateral primary osteoarthritis, right knee 11/06/2020   Chest pain of uncertain etiology 08/29/2020   Cardiac murmur 08/29/2020   Mixed dyslipidemia 08/29/2020   Arthritis 08/16/2020   Breast cancer (HCC) 08/16/2020   Headache 08/16/2020    Osteopenia 08/16/2020   Vision abnormalities 08/16/2020   Chronic right shoulder pain 03/07/2018   Chronic migraine 08/11/2017    PCP: Merri Brunette MD   REFERRING PROVIDER: Kathryne Hitch, MD  REFERRING DIAG: [phone] (ICD-10-CM) - History of right hip replacement  THERAPY DIAG:  Pain in right leg  Other symptoms and signs involving the musculoskeletal system  Muscle weakness (generalized)  Unsteadiness on feet  Rationale for Evaluation and Treatment: Rehabilitation  ONSET DATE: 04/27/2022  SUBJECTIVE:   SUBJECTIVE STATEMENT:  Not feeling bad after last time, had a little pain like the time before but not much. Nothing else new going on. Feel like PT is helping so far, don't have as much pain or soreness but its still there.   PERTINENT HISTORY: The patient comes in today at around a year status post a right total hip arthroplasty.  She still has some pain over the IT band and some in the groin but mainly just her incision.  She is an active 77 year old female.  She is walking without assistive device.  She actually was seen in the office in early February and x-rays were obtained then of her pelvis and right hip and I did see these and it showed a well-seated implant with  no complicating features.   On exam her right hip moves smoothly and fluidly.  Her pain is mainly over the IT band and the trochanteric area.  There is a little bit of pain in the groin but this is more incisional pain.  She has tried Voltaren gel for this.   I would like to send her to outpatient physical therapy for any modalities that the therapist can try on her right hip IT band and groin area with no restrictions from an orthopedic standpoint.  I will try a steroid taper as well and would like to see her back in about 6 weeks.  She agrees with this treatment plan.   PAIN:  Are you having pain?  NPRS scale: 0/10 Pain location:  Pain description:  Aggravating factors:  Relieving factors:     PRECAUTIONS: None  WEIGHT BEARING RESTRICTIONS: No  FALLS:  Has patient fallen in last 6 months? No  LIVING ENVIRONMENT: Lives with: lives alone Lives in: House/apartment Stairs: 4-5 steps in front but doesn't usually go this way, preferred entrance has 1 step  Has following equipment at home:  hurry-cane   OCCUPATION: retired   PLOF: Independent, Independent with basic ADLs, Independent with gait, and Independent with transfers  PATIENT GOALS: get rid of pain, be able to lay on right side comfortably   OBJECTIVE:   DIAGNOSTIC FINDINGS: Radiographs AP pelvis right hip demonstrate findings consistent with right  hip replacement.  Good congruent spacing between the acetabular and  femoral head components.  No evidence of any loosening or periprosthetic  fracture  PATIENT SURVEYS:  05/06/22: FOTO 77, predicted 77   SENSATION: Not tested "maybe some numbness down the outside of that leg"   EDEMA:  Reports some edema around hip   MUSCLE LENGTH: 05/27/22: Hamstrings: mild limitation bil Piriformis: mild limitation bil    EVAL: Quads: severe limitation B  Hip flexors: moderate limitation B Hamstrings: moderate limitation B Piriformis: severe limitation R, moderate limitation L   POSTURE: rounded shoulders, forward head, increased thoracic kyphosis, and flexed trunk   PALPATION: Upper R glute tender and with some deep trigger points, very TTP lateral R thigh   LOWER EXTREMITY MMT:  MMT Right eval Left eval Right 05/22/22 Left 05/22/22  Hip flexion 3- 3- 4 4  Hip extension 3- 3- 3- 3-  Hip abduction 4 3+ 4+ 3  Hip adduction      Hip internal rotation      Hip external rotation      Knee flexion 4 4 4+ 4+  Knee extension 4+ 4+ 4+ 4+  Ankle dorsiflexion 5 5    Ankle plantarflexion      Ankle inversion      Ankle eversion       (Blank rows = not tested)  FUNCTIONAL TESTS:  05/06/22: Dynamic Gait Index: 18/24  GAIT: Distance walked: in clinic distances   Assistive device utilized: None Level of assistance: Complete Independence Comments: flexed at hips, + trendelenburg    TODAY'S TREATMENT:  DATE:   06/01/22  TherEx  Nustep L6x6 minutes BLEs only 3 way hip green TB x10 B Hip hikes with green TB for resistance x12 B Hip hikes + side step 3 rounds (about 32ft each round)   NMR  Tandem stance blue foam pad 3x30 seconds B 3 way taps off of blue foam pad x10 B Standing marches blue foam pad x20 total     05/29/22  TherEx   Scifit bike L6 x6 minutes BLEs only 3D hip excursions x20 each direction  STS no UEs x4-5 (attempted more but limited by R knee pain) Hip hikes x15 B Hip hikes + ABD x12 B Shuttle leg press 106# 2x12 double leg presses; single leg presses 2x10 B 75#    NMR  Tandem stance blue foam pad 3x30 seconds B  Tandem gait solid surface // bars 5 laps  EC blue foam pad 3x30 seconds     05/27/22 TherEx NuStep L6 x 5 min Dynamic hamstring stretch 10 x 5 sec hold in supine bil Dynamic piriformis stretch in supine 10 x 5 sec hold bil Bridges x 10 reps; 5 sec hold Lower trunk rotation 5x5 sec hold bil Sit to/from stand x 10 reps; no UE support Side stepping at counter without UE support L3 band x 5 laps Standing hip extension L3 band 2x10 bil Mini squats at counter 2x10 Calf raises x 20 reps Leg Press 106# 2x10 Demonstrated use of tennis ball for self mobilization   05/22/22 MMT/brief goal review   TherEx Nustep L5 x6 minute BLEs only Staggered bridges x10 B (progressing to single leg bridge) Walking bridges x10  Lumbar rotation stretch 5x5 seconds B Supine figure 4 stretch 3x30 seconds B Standing 3D hip excursions x20 each direction Hip hikes x15 B Mini squats in front of mat table x10 cues for form Forward step ups 6 inch box x15 B Tandem stance solid surface 2x30  seconds B    05/20/22  TherEx  Nustep L4x6 minutes BLEs only Bridges with red TB x15 with 2 second holds  Sidelying hip ABD x10 B no resistance  Walking bridges x10  Supine HS stretches 3x30 seconds B Supine figure 4 stretch 3x30 seconds B Lumbar rotation stretches 5x5 seconds B Forward step ups 6 inch box x12 B no UEs Lateral step ups 6 inch box x12 B no UEs  Hip hikes x12 B     05/14/22 TherEx Recumbent bike L4 x 8 min Bridges with L2 band 2 x 10 reps; 3-5 sec hold Clamshell 2x10; L2 band Seated piriformis stretch 3x30 sec bil Seated hamstring stretch 3x30 sec bil Sit to/from stand x10 reps Seated clamshells L2 band 2x10 Standing hip abduction 2x10 bil Standing hip extension 2x10 bil Calf raises 2x10; light UE support Mini squats 2x10   Eval Objective measures + appropriate education  TherEx  Nustep L4x6 minutes BLEs only Bridges + red TB x5 Supine clams red TB x5 Piriformis stretch x30 seconds B HS stretch x30 seconds B    PATIENT EDUCATION:  Education details: exam findings, POC, HEP  Person educated: Patient Education method: Programmer, multimedia, Demonstration, and Handouts Education comprehension: verbalized understanding, returned demonstration, and needs further education  HOME EXERCISE PROGRAM:  Access Code: [email or web address] URL: [email or web address] Date: 06/01/2022 Prepared by: Nedra Hai  Exercises - Supine Bridge with Resistance Band  - 2 x daily - 7 x weekly - 1 sets - 10 reps - 3 hold - Hooklying Clamshell with Resistance  - 2 x daily - 7  x weekly - 1 sets - 10 reps - 3 hold - Seated Figure 4 Piriformis Stretch  - 2 x daily - 7 x weekly - 1 sets - 3 reps - 30 hold - Seated Hamstring Stretch  - 2 x daily - 7 x weekly - 1 sets - 3 reps - 30 hold - Staggered Bridge  - 2 x daily - 7 x weekly - 1 sets - 10 reps - 2 hold - Bridge Walk Out  - 2 x daily - 7 x weekly - 1 sets - 10 reps - 3 hold - Standing 3-way Hip with Walker  - 2 x daily - 7  x weekly - 1 sets - 10 reps - 1 hold - Standing Hip Hiking  - 2 x daily - 7 x weekly - 1 sets - 10 reps - 1 hold  ASSESSMENT:  CLINICAL IMPRESSION:   Mada arrives today doing OK, feeling better- seems like repeated squatting tends to really aggravate her knee pain so we avoided this as much as possible today. Continued working on functional strengthening as able and tolerated, also continued incorporating balance training. Will continue efforts.    OBJECTIVE IMPAIRMENTS: Abnormal gait, decreased balance, difficulty walking, decreased strength, increased fascial restrictions, increased muscle spasms, impaired flexibility, improper body mechanics, postural dysfunction, and pain.   ACTIVITY LIMITATIONS: standing, squatting, stairs, transfers, and locomotion level  PARTICIPATION LIMITATIONS: cleaning, laundry, driving, shopping, community activity, occupation, and yard work  PERSONAL FACTORS: Age, Fitness, Past/current experiences, and Time since onset of injury/illness/exacerbation are also affecting patient's functional outcome.   REHAB POTENTIAL: Good  CLINICAL DECISION MAKING: Stable/uncomplicated  EVALUATION COMPLEXITY: Low   GOALS: Goals reviewed with patient? Yes  SHORT TERM GOALS: Target date: 05/27/2022   Will be compliant with appropriate progressive HEP  Goal status: MET 05/22/22  2.  Pain when laying on right side to be no more than 2/10 at worst  Goal status: Partially MET 05/27/22; 2-3/10  3.  Muscle flexibility to be no more than 25% limited in all tight groups  Goal status: MET 05/27/22  4.  Will be independent with soft tissue spasm management techniques including foam roller or tennis ball massage  Goal status: MET 05/27/22   LONG TERM GOALS: Target date: 06/17/2022    MMT to improve by at least 1 grade in all weak groups  Baseline:  Goal status: IN PROGRESS 05/22/22  2.  Will score at least 22/24 on DGI  Baseline:  Goal status: INITIAL  3.  Will be  able to ambulate at least 1 mile without increase in pain  Baseline:  Goal status: INITIAL  4.  Will be compliant with appropriate gym based exercise program to maintain functional gains/prevent recurrence of condition  Baseline:  Goal status: INITIAL    PLAN:  PT FREQUENCY: 2x/week  PT DURATION: 6 weeks  PLANNED INTERVENTIONS: Therapeutic exercises, Therapeutic activity, Neuromuscular re-education, Balance training, Gait training, Patient/Family education, Self Care, Joint mobilization, Stair training, Aquatic Therapy, Dry Needling, Electrical stimulation, Cryotherapy, Moist heat, Taping, Ultrasound, Ionotophoresis 4mg /ml Dexamethasone, Manual therapy, and Re-evaluation  PLAN FOR NEXT SESSION:  continue general strength and flexibility, consider DN, balance training. Caution with squats/repeated knee flexion movements due to knee pain. Progress note next visit before MD appt on 5/6.   NEXT MD VISIT: Dr. Magnus Ivan May 6th    Nedra Hai PT DPT PN2

## 2022-06-03 ENCOUNTER — Encounter: Payer: Self-pay | Admitting: Physical Therapy

## 2022-06-03 ENCOUNTER — Ambulatory Visit: Payer: Medicare Other | Admitting: Physical Therapy

## 2022-06-03 DIAGNOSIS — R2681 Unsteadiness on feet: Secondary | ICD-10-CM

## 2022-06-03 DIAGNOSIS — R29898 Other symptoms and signs involving the musculoskeletal system: Secondary | ICD-10-CM | POA: Diagnosis not present

## 2022-06-03 DIAGNOSIS — M79604 Pain in right leg: Secondary | ICD-10-CM | POA: Diagnosis not present

## 2022-06-03 DIAGNOSIS — M6281 Muscle weakness (generalized): Secondary | ICD-10-CM

## 2022-06-03 NOTE — Therapy (Signed)
OUTPATIENT PHYSICAL THERAPY TREATMENT/PROGRESS NOTE   Patient Name: Anna [name] MRN: [id number] DOB:1946-01-26, 77 y.o., female Today's Date: 06/03/2022  Progress Note Reporting Period 05/06/22 to 06/03/22  See note below for Objective Data and Assessment of Progress/Goals.      END OF SESSION:  PT End of Session - 06/03/22 0909     Visit Number 8    Number of Visits 13    Date for PT Re-Evaluation 06/17/22    Authorization Type [employer] MCR    Authorization Time Period 05/06/22 to 06/17/22    Progress Note Due on Visit 18    PT Start Time 0846    PT Stop Time 0928    PT Time Calculation (min) 42 min    Activity Tolerance Patient tolerated treatment well    Behavior During Therapy WFL for tasks assessed/performed                    Past Medical History:  Diagnosis Date   Arthritis    Breast cancer [facility])    age 68   Family history of adverse reaction to anesthesia    Son takes longer to wake   Headache    Hyperlipidemia    Osteopenia    Vision abnormalities    Past Surgical History:  Procedure Laterality Date   BACK SURGERY     lumbar region   BUNIONECTOMY Bilateral    MASTECTOMY Left    at age 56   TOTAL HIP ARTHROPLASTY Right 04/22/2021   Procedure: Right TOTAL HIP ARTHROPLASTY ANTERIOR APPROACH;  Surgeon: Kathryne Hitch, MD;  Location: [facility] OR;  Service: Orthopedics;  Laterality: Right;   TRIGGER FINGER RELEASE Left    left thumb   TUBAL LIGATION     Patient Active Problem List   Diagnosis Date Noted   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[facility]) joint of left thumb 06/10/2021   Digital mucous cyst of finger 06/10/2021   Status post total replacement of right hip 04/22/2021   Mixed hyperlipidemia 02/06/2021   Musculoskeletal chest pain 02/06/2021   Obesity (BMI 30-39.9) 02/06/2021   Unilateral primary osteoarthritis, right hip 11/06/2020   Unilateral primary osteoarthritis, right knee 11/06/2020   Chest pain of uncertain etiology 08/29/2020    Cardiac murmur 08/29/2020   Mixed dyslipidemia 08/29/2020   Arthritis 08/16/2020   Breast cancer (HCC) 08/16/2020   Headache 08/16/2020   Osteopenia 08/16/2020   Vision abnormalities 08/16/2020   Chronic right shoulder pain 03/07/2018   Chronic migraine 08/11/2017    PCP: Merri Brunette MD   REFERRING PROVIDER: Kathryne Hitch, MD  REFERRING DIAG: [phone] (ICD-10-CM) - History of right hip replacement  THERAPY DIAG:  Pain in right leg  Other symptoms and signs involving the musculoskeletal system  Muscle weakness (generalized)  Unsteadiness on feet  Rationale for Evaluation and Treatment: Rehabilitation  ONSET DATE: 04/27/2022  SUBJECTIVE:   SUBJECTIVE STATEMENT:  Feeling pretty good, no big changes, just the usual pain. Still having pain on the outside of the right leg. Incision seems to do better some days it still stays tight. Would rate myself about 60/100 right now, have good and bad days; not sure what the last 40% is keeping me from getting to 100%   PERTINENT HISTORY: The patient comes in today at around a year status post a right total hip arthroplasty.  She still has some pain over the IT band and some in the groin but mainly just her incision.  She is an active 77 year old female.  She is walking without assistive device.  She actually was seen in the office in early February and x-rays were obtained then of her pelvis and right hip and I did see these and it showed a well-seated implant with no complicating features.   On exam her right hip moves smoothly and fluidly.  Her pain is mainly over the IT band and the trochanteric area.  There is a little bit of pain in the groin but this is more incisional pain.  She has tried Voltaren gel for this.   I would like to send her to outpatient physical therapy for any modalities that the therapist can try on her right hip IT band and groin area with no restrictions from an orthopedic standpoint.  I will try a  steroid taper as well and would like to see her back in about 6 weeks.  She agrees with this treatment plan.   PAIN:  Are you having pain?  NPRS scale: 0/10 Pain location:  Pain description:  Aggravating factors:  Relieving factors:    PRECAUTIONS: None  WEIGHT BEARING RESTRICTIONS: No  FALLS:  Has patient fallen in last 6 months? No  LIVING ENVIRONMENT: Lives with: lives alone Lives in: House/apartment Stairs: 4-5 steps in front but doesn't usually go this way, preferred entrance has 1 step  Has following equipment at home:  hurry-cane   OCCUPATION: retired   PLOF: Independent, Independent with basic ADLs, Independent with gait, and Independent with transfers  PATIENT GOALS: get rid of pain, be able to lay on right side comfortably   OBJECTIVE:   DIAGNOSTIC FINDINGS: Radiographs AP pelvis right hip demonstrate findings consistent with right  hip replacement.  Good congruent spacing between the acetabular and  femoral head components.  No evidence of any loosening or periprosthetic  fracture  PATIENT SURVEYS:  05/06/22: FOTO 77, predicted 77   SENSATION: Not tested "maybe some numbness down the outside of that leg"   EDEMA:  Reports some edema around hip   MUSCLE LENGTH: 05/27/22: Hamstrings: mild limitation bil Piriformis: mild limitation bil    EVAL: Quads: severe limitation B; 06/03/22 severe limitation B  Hip flexors: moderate limitation B; 06/03/22 moderate limitation B Hamstrings: moderate limitation B; 06/03/22 mild limitation B  Piriformis: severe limitation R, moderate limitation L ; 06/03/22 mild limitation B   POSTURE: rounded shoulders, forward head, increased thoracic kyphosis, and flexed trunk   PALPATION: Upper R glute tender and with some deep trigger points, very TTP lateral R thigh   LOWER EXTREMITY MMT:  MMT Right eval Left eval Right 05/22/22 Left 05/22/22 Right 06/03/22 Left 06/03/22  Hip flexion 3- 3- 4 4 4 4   Hip extension 3- 3- 3- 3- 3- 3-   Hip abduction 4 3+ 4+ 3 4+ 3+  Hip adduction        Hip internal rotation        Hip external rotation        Knee flexion 4 4 4+ 4+ 5 5  Knee extension 4+ 4+ 4+ 4+ 5 4+  Ankle dorsiflexion 5 5   5 5   Ankle plantarflexion        Ankle inversion        Ankle eversion         (Blank rows = not tested)  FUNCTIONAL TESTS:  05/06/22: Dynamic Gait Index: 18/24; 06/03/22 20/24   GAIT: Distance walked: in clinic distances  Assistive device utilized: None Level of assistance: Complete Independence Comments: flexed at hips, +  trendelenburg    TODAY'S TREATMENT:                                                                                                                              DATE:   06/03/22  FOTO 59  Objective measures for progress note prior to MD visit  Goal review/appropriate education  TherEx  Bridges with green TB above knees x15 with 3 second holds  Supine hip ABD with green TB above knees 2x12 B Hip flexor stretch off edge of table 4x15 seconds B    06/01/22  TherEx  Nustep L6x6 minutes BLEs only 3 way hip green TB x10 B Hip hikes with green TB for resistance x12 B Hip hikes + side step 3 rounds (about 20ft each round)   NMR  Tandem stance blue foam pad 3x30 seconds B 3 way taps off of blue foam pad x10 B Standing marches blue foam pad x20 total     05/29/22  TherEx   Scifit bike L6 x6 minutes BLEs only 3D hip excursions x20 each direction  STS no UEs x4-5 (attempted more but limited by R knee pain) Hip hikes x15 B Hip hikes + ABD x12 B Shuttle leg press 106# 2x12 double leg presses; single leg presses 2x10 B 75#    NMR  Tandem stance blue foam pad 3x30 seconds B  Tandem gait solid surface // bars 5 laps  EC blue foam pad 3x30 seconds     05/27/22 TherEx NuStep L6 x 5 min Dynamic hamstring stretch 10 x 5 sec hold in supine bil Dynamic piriformis stretch in supine 10 x 5 sec hold bil Bridges x 10 reps; 5 sec hold Lower trunk  rotation 5x5 sec hold bil Sit to/from stand x 10 reps; no UE support Side stepping at counter without UE support L3 band x 5 laps Standing hip extension L3 band 2x10 bil Mini squats at counter 2x10 Calf raises x 20 reps Leg Press 106# 2x10 Demonstrated use of tennis ball for self mobilization   05/22/22 MMT/brief goal review   TherEx Nustep L5 x6 minute BLEs only Staggered bridges x10 B (progressing to single leg bridge) Walking bridges x10  Lumbar rotation stretch 5x5 seconds B Supine figure 4 stretch 3x30 seconds B Standing 3D hip excursions x20 each direction Hip hikes x15 B Mini squats in front of mat table x10 cues for form Forward step ups 6 inch box x15 B Tandem stance solid surface 2x30 seconds B    05/20/22  TherEx  Nustep L4x6 minutes BLEs only Bridges with red TB x15 with 2 second holds  Sidelying hip ABD x10 B no resistance  Walking bridges x10  Supine HS stretches 3x30 seconds B Supine figure 4 stretch 3x30 seconds B Lumbar rotation stretches 5x5 seconds B Forward step ups 6 inch box x12 B no UEs Lateral step ups 6 inch box x12 B no UEs  Hip hikes x12 B  05/14/22 TherEx Recumbent bike L4 x 8 min Bridges with L2 band 2 x 10 reps; 3-5 sec hold Clamshell 2x10; L2 band Seated piriformis stretch 3x30 sec bil Seated hamstring stretch 3x30 sec bil Sit to/from stand x10 reps Seated clamshells L2 band 2x10 Standing hip abduction 2x10 bil Standing hip extension 2x10 bil Calf raises 2x10; light UE support Mini squats 2x10   Eval Objective measures + appropriate education  TherEx  Nustep L4x6 minutes BLEs only Bridges + red TB x5 Supine clams red TB x5 Piriformis stretch x30 seconds B HS stretch x30 seconds B    PATIENT EDUCATION:  Education details: exam findings, POC, HEP  Person educated: Patient Education method: Programmer, multimedia, Demonstration, and Handouts Education comprehension: verbalized understanding, returned demonstration, and needs  further education  HOME EXERCISE PROGRAM:  Access Code: [email or web address] URL: [email or web address] Date: 06/01/2022 Prepared by: Nedra Hai  Exercises - Supine Bridge with Resistance Band  - 2 x daily - 7 x weekly - 1 sets - 10 reps - 3 hold - Hooklying Clamshell with Resistance  - 2 x daily - 7 x weekly - 1 sets - 10 reps - 3 hold - Seated Figure 4 Piriformis Stretch  - 2 x daily - 7 x weekly - 1 sets - 3 reps - 30 hold - Seated Hamstring Stretch  - 2 x daily - 7 x weekly - 1 sets - 3 reps - 30 hold - Staggered Bridge  - 2 x daily - 7 x weekly - 1 sets - 10 reps - 2 hold - Bridge Walk Out  - 2 x daily - 7 x weekly - 1 sets - 10 reps - 3 hold - Standing 3-way Hip with Walker  - 2 x daily - 7 x weekly - 1 sets - 10 reps - 1 hold - Standing Hip Hiking  - 2 x daily - 7 x weekly - 1 sets - 10 reps - 1 hold  ASSESSMENT:  CLINICAL IMPRESSION:   Opted to get full progress note today as Anna [name] sees her referring MD on 5/6. Seems to be making some progress, main remaining impairments are in quad flexibility as well as proximal strength and some ongoing pain R thigh/lateral leg. That being said, she is making progress towards her goals. Will continue with POC.    OBJECTIVE IMPAIRMENTS: Abnormal gait, decreased balance, difficulty walking, decreased strength, increased fascial restrictions, increased muscle spasms, impaired flexibility, improper body mechanics, postural dysfunction, and pain.   ACTIVITY LIMITATIONS: standing, squatting, stairs, transfers, and locomotion level  PARTICIPATION LIMITATIONS: cleaning, laundry, driving, shopping, community activity, occupation, and yard work  PERSONAL FACTORS: Age, Fitness, Past/current experiences, and Time since onset of injury/illness/exacerbation are also affecting patient's functional outcome.   REHAB POTENTIAL: Good  CLINICAL DECISION MAKING: Stable/uncomplicated  EVALUATION COMPLEXITY: Low   GOALS: Goals reviewed with  patient? Yes  SHORT TERM GOALS: Target date: 05/27/2022   Will be compliant with appropriate progressive HEP  Goal status: MET 05/22/22  2.  Pain when laying on right side to be no more than 2/10 at worst  Goal status: IN PROGRESS 06/03/22- returned to 7/10 pain when laying on R side   3.  Muscle flexibility to be no more than 25% limited in all tight groups  Goal status: PARTIALLY MET 06/03/22  4.  Will be independent with soft tissue spasm management techniques including foam roller or tennis ball massage  Goal status: MET 05/27/22   LONG TERM GOALS: Target date: 06/17/2022    MMT  to improve by at least 1 grade in all weak groups  Baseline:  Goal status: IN PROGRESS 06/03/22  2.  Will score at least 22/24 on DGI  Baseline:  Goal status: IN PROGRESS 06/03/22- 20/24  3.  Will be able to ambulate at least 1 mile without increase in pain  Baseline:  Goal status: MET 06/03/22  4.  Will be compliant with appropriate gym based exercise program to maintain functional gains/prevent recurrence of condition  Baseline:  Goal status: IN PROGRESS 06/03/22    PLAN:  PT FREQUENCY: 2x/week  PT DURATION: 6 weeks  PLANNED INTERVENTIONS: Therapeutic exercises, Therapeutic activity, Neuromuscular re-education, Balance training, Gait training, Patient/Family education, Self Care, Joint mobilization, Stair training, Aquatic Therapy, Dry Needling, Electrical stimulation, Cryotherapy, Moist heat, Taping, Ultrasound, Ionotophoresis 4mg /ml Dexamethasone, Manual therapy, and Re-evaluation  PLAN FOR NEXT SESSION:  continue general strength and flexibility, consider DN, balance training. Caution with squats/repeated knee flexion movements due to knee pain. Needs more work on proximal hip strength and quad/hip flexor flexibility   NEXT MD VISIT: Dr. Magnus Ivan May 6th    Nedra Hai PT DPT PN2

## 2022-06-08 ENCOUNTER — Ambulatory Visit: Payer: Medicare Other | Admitting: Physical Therapy

## 2022-06-08 ENCOUNTER — Encounter: Payer: Self-pay | Admitting: Orthopaedic Surgery

## 2022-06-08 ENCOUNTER — Ambulatory Visit: Payer: Medicare Other | Admitting: Orthopaedic Surgery

## 2022-06-08 ENCOUNTER — Encounter: Payer: Self-pay | Admitting: Physical Therapy

## 2022-06-08 DIAGNOSIS — R29898 Other symptoms and signs involving the musculoskeletal system: Secondary | ICD-10-CM | POA: Diagnosis not present

## 2022-06-08 DIAGNOSIS — R2681 Unsteadiness on feet: Secondary | ICD-10-CM | POA: Diagnosis not present

## 2022-06-08 DIAGNOSIS — M6281 Muscle weakness (generalized): Secondary | ICD-10-CM

## 2022-06-08 DIAGNOSIS — M79604 Pain in right leg: Secondary | ICD-10-CM | POA: Diagnosis not present

## 2022-06-08 DIAGNOSIS — Z96641 Presence of right artificial hip joint: Secondary | ICD-10-CM | POA: Diagnosis not present

## 2022-06-08 DIAGNOSIS — M25551 Pain in right hip: Secondary | ICD-10-CM | POA: Diagnosis not present

## 2022-06-08 NOTE — Therapy (Signed)
OUTPATIENT PHYSICAL THERAPY TREATMENT   Patient Name: Anna [name] MRN: [id number] DOB:20-Mar-1945, 77 y.o., female Today's Date: 06/08/2022      END OF SESSION:  PT End of Session - 06/08/22 1007     Visit Number 9    Number of Visits 13    Date for PT Re-Evaluation 06/17/22    Authorization Type UHC MCR    Authorization Time Period 05/06/22 to 06/17/22    Progress Note Due on Visit 18    PT Start Time 1010    PT Stop Time 1050    PT Time Calculation (min) 40 min    Activity Tolerance Patient tolerated treatment well    Behavior During Therapy WFL for tasks assessed/performed                     Past Medical History:  Diagnosis Date   Arthritis    Breast cancer [facility])    age 73   Family history of adverse reaction to anesthesia    Son takes longer to wake   Headache    Hyperlipidemia    Osteopenia    Vision abnormalities    Past Surgical History:  Procedure Laterality Date   BACK SURGERY     lumbar region   BUNIONECTOMY Bilateral    MASTECTOMY Left    at age 12   TOTAL HIP ARTHROPLASTY Right 04/22/2021   Procedure: Right TOTAL HIP ARTHROPLASTY ANTERIOR APPROACH;  Surgeon: Kathryne Hitch, MD;  Location: [facility] OR;  Service: Orthopedics;  Laterality: Right;   TRIGGER FINGER RELEASE Left    left thumb   TUBAL LIGATION     Patient Active Problem List   Diagnosis Date Noted   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[facility]) joint of left thumb 06/10/2021   Digital mucous cyst of finger 06/10/2021   Status post total replacement of right hip 04/22/2021   Mixed hyperlipidemia 02/06/2021   Musculoskeletal chest pain 02/06/2021   Obesity (BMI 30-39.9) 02/06/2021   Unilateral primary osteoarthritis, right hip 11/06/2020   Unilateral primary osteoarthritis, right knee 11/06/2020   Chest pain of uncertain etiology 08/29/2020   Cardiac murmur 08/29/2020   Mixed dyslipidemia 08/29/2020   Arthritis 08/16/2020   Breast cancer (HCC) 08/16/2020   Headache 08/16/2020    Osteopenia 08/16/2020   Vision abnormalities 08/16/2020   Chronic right shoulder pain 03/07/2018   Chronic migraine 08/11/2017    PCP: Merri Brunette MD   REFERRING PROVIDER: Kathryne Hitch, MD  REFERRING DIAG: [phone] (ICD-10-CM) - History of right hip replacement  THERAPY DIAG:  Pain in right leg  Other symptoms and signs involving the musculoskeletal system  Muscle weakness (generalized)  Unsteadiness on feet  Rationale for Evaluation and Treatment: Rehabilitation  ONSET DATE: 04/27/2022  SUBJECTIVE:   SUBJECTIVE STATEMENT: Doing well; hanging in there  PERTINENT HISTORY: The patient comes in today at around a year status post a right total hip arthroplasty.  She still has some pain over the IT band and some in the groin but mainly just her incision.  She is an active 77 year old female.  She is walking without assistive device.  She actually was seen in the office in early February and x-rays were obtained then of her pelvis and right hip and I did see these and it showed a well-seated implant with no complicating features.   On exam her right hip moves smoothly and fluidly.  Her pain is mainly over the IT band and the trochanteric area.  There is a  little bit of pain in the groin but this is more incisional pain.  She has tried Voltaren gel for this.   I would like to send her to outpatient physical therapy for any modalities that the therapist can try on her right hip IT band and groin area with no restrictions from an orthopedic standpoint.  I will try a steroid taper as well and would like to see her back in about 6 weeks.  She agrees with this treatment plan.   PAIN:  Are you having pain?  NPRS scale: 0/10 Pain location:  Pain description:  Aggravating factors:  Relieving factors:    PRECAUTIONS: None  WEIGHT BEARING RESTRICTIONS: No  FALLS:  Has patient fallen in last 6 months? No  LIVING ENVIRONMENT: Lives with: lives alone Lives in:  House/apartment Stairs: 4-5 steps in front but doesn't usually go this way, preferred entrance has 1 step  Has following equipment at home:  hurry-cane   OCCUPATION: retired   PLOF: Independent, Independent with basic ADLs, Independent with gait, and Independent with transfers  PATIENT GOALS: get rid of pain, be able to lay on right side comfortably   OBJECTIVE:   DIAGNOSTIC FINDINGS: Radiographs AP pelvis right hip demonstrate findings consistent with right  hip replacement.  Good congruent spacing between the acetabular and  femoral head components.  No evidence of any loosening or periprosthetic  fracture  PATIENT SURVEYS:  05/06/22: FOTO 77, predicted 77 06/03/22: FOTO 59   SENSATION: Not tested "maybe some numbness down the outside of that leg"   EDEMA:  Reports some edema around hip   MUSCLE LENGTH: 05/27/22: Hamstrings: mild limitation bil Piriformis: mild limitation bil    EVAL: Quads: severe limitation B; 06/03/22 severe limitation B  Hip flexors: moderate limitation B; 06/03/22 moderate limitation B Hamstrings: moderate limitation B; 06/03/22 mild limitation B  Piriformis: severe limitation R, moderate limitation L ; 06/03/22 mild limitation B   POSTURE: rounded shoulders, forward head, increased thoracic kyphosis, and flexed trunk   PALPATION: Upper R glute tender and with some deep trigger points, very TTP lateral R thigh   LOWER EXTREMITY MMT:  MMT Right eval Left eval Right 05/22/22 Left 05/22/22 Right 06/03/22 Left 06/03/22  Hip flexion 3- 3- 4 4 4 4   Hip extension 3- 3- 3- 3- 3- 3-  Hip abduction 4 3+ 4+ 3 4+ 3+  Hip adduction        Hip internal rotation        Hip external rotation        Knee flexion 4 4 4+ 4+ 5 5  Knee extension 4+ 4+ 4+ 4+ 5 4+  Ankle dorsiflexion 5 5   5 5   Ankle plantarflexion        Ankle inversion        Ankle eversion         (Blank rows = not tested)  FUNCTIONAL TESTS:  05/06/22: Dynamic Gait Index: 18/24   06/03/22 Dynamic Gait  Index 20/24  GAIT: Distance walked: in clinic distances  Assistive device utilized: None Level of assistance: Complete Independence Comments: flexed at hips, + trendelenburg    TODAY'S TREATMENT:  DATE:  06/08/22 TherEx NuStep L6 x 8 min Forward step ups onto 6" step x 10 bil Lateral step ups onto 6" step x 10 bil Standing hip abduction 2x10 bil; L4 band Standing hip extension 2x10 bil; L4 band Standing calf raises x20 reps Leg press 106# 3x10 Single knee to chest 3x30 sec bil Supine piriformis stretch 3x30 sec bil Bridges 2x10; 5 sec hold   06/03/22  FOTO 59  Objective measures for progress note prior to MD visit  Goal review/appropriate education  TherEx  Bridges with green TB above knees x15 with 3 second holds  Supine hip ABD with green TB above knees 2x12 B Hip flexor stretch off edge of table 4x15 seconds B    06/01/22  TherEx  Nustep L6x6 minutes BLEs only 3 way hip green TB x10 B Hip hikes with green TB for resistance x12 B Hip hikes + side step 3 rounds (about 93ft each round)   NMR  Tandem stance blue foam pad 3x30 seconds B 3 way taps off of blue foam pad x10 B Standing marches blue foam pad x20 total     PATIENT EDUCATION:  Education details: exam findings, POC, HEP  Person educated: Patient Education method: Programmer, multimedia, Facilities manager, and Handouts Education comprehension: verbalized understanding, returned demonstration, and needs further education  HOME EXERCISE PROGRAM:  Access Code: [email or web address] URL: [email or web address] Date: 06/01/2022 Prepared by: Nedra Hai  Exercises - Supine Bridge with Resistance Band  - 2 x daily - 7 x weekly - 1 sets - 10 reps - 3 hold - Hooklying Clamshell with Resistance  - 2 x daily - 7 x weekly - 1 sets - 10 reps - 3 hold - Seated Figure 4 Piriformis Stretch  - 2 x  daily - 7 x weekly - 1 sets - 3 reps - 30 hold - Seated Hamstring Stretch  - 2 x daily - 7 x weekly - 1 sets - 3 reps - 30 hold - Staggered Bridge  - 2 x daily - 7 x weekly - 1 sets - 10 reps - 2 hold - Bridge Walk Out  - 2 x daily - 7 x weekly - 1 sets - 10 reps - 3 hold - Standing 3-way Hip with Walker  - 2 x daily - 7 x weekly - 1 sets - 10 reps - 1 hold - Standing Hip Hiking  - 2 x daily - 7 x weekly - 1 sets - 10 reps - 1 hold  ASSESSMENT:  CLINICAL IMPRESSION: Pt tolerated session well today with continued focus on strengthening and stretching.  Anticipate d/c next 2-3 visits so will initiate d/c planning and address any remaining concerns.  OBJECTIVE IMPAIRMENTS: Abnormal gait, decreased balance, difficulty walking, decreased strength, increased fascial restrictions, increased muscle spasms, impaired flexibility, improper body mechanics, postural dysfunction, and pain.   ACTIVITY LIMITATIONS: standing, squatting, stairs, transfers, and locomotion level  PARTICIPATION LIMITATIONS: cleaning, laundry, driving, shopping, community activity, occupation, and yard work  PERSONAL FACTORS: Age, Fitness, Past/current experiences, and Time since onset of injury/illness/exacerbation are also affecting patient's functional outcome.   REHAB POTENTIAL: Good  CLINICAL DECISION MAKING: Stable/uncomplicated  EVALUATION COMPLEXITY: Low   GOALS: Goals reviewed with patient? Yes  SHORT TERM GOALS: Target date: 05/27/2022   Will be compliant with appropriate progressive HEP  Goal status: MET 05/22/22  2.  Pain when laying on right side to be no more than 2/10 at worst  Goal status: IN PROGRESS 06/03/22- returned to 7/10 pain when laying on  R side   3.  Muscle flexibility to be no more than 25% limited in all tight groups  Goal status: PARTIALLY MET 06/03/22  4.  Will be independent with soft tissue spasm management techniques including foam roller or tennis ball massage  Goal status: MET  05/27/22   LONG TERM GOALS: Target date: 06/17/2022    MMT to improve by at least 1 grade in all weak groups  Baseline:  Goal status: IN PROGRESS 06/03/22  2.  Will score at least 22/24 on DGI  Baseline:  Goal status: IN PROGRESS 06/03/22- 20/24  3.  Will be able to ambulate at least 1 mile without increase in pain  Baseline:  Goal status: MET 06/03/22  4.  Will be compliant with appropriate gym based exercise program to maintain functional gains/prevent recurrence of condition  Baseline:  Goal status: IN PROGRESS 06/03/22    PLAN:  PT FREQUENCY: 2x/week  PT DURATION: 6 weeks  PLANNED INTERVENTIONS: Therapeutic exercises, Therapeutic activity, Neuromuscular re-education, Balance training, Gait training, Patient/Family education, Self Care, Joint mobilization, Stair training, Aquatic Therapy, Dry Needling, Electrical stimulation, Cryotherapy, Moist heat, Taping, Ultrasound, Ionotophoresis 4mg /ml Dexamethasone, Manual therapy, and Re-evaluation  PLAN FOR NEXT SESSION:  begin d/c planning, continue general strength and flexibility, consider DN, balance training. Caution with squats/repeated knee flexion movements due to knee pain. Needs more work on proximal hip strength and quad/hip flexor flexibility   NEXT MD VISIT: Dr. Magnus Ivan May 6th    Jelani Trueba F Hatillo, [place], DPT 06/08/22 10:59 AM

## 2022-06-08 NOTE — Progress Notes (Signed)
The patient is over a year out from a right total hip arthroplasty.  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  She has been having a good response from physical therapy and says it still tender but she is "getting there".  She is not walking with assistive device.  She is a very active 77 year old female.  We had x-rayed her hip earlier this year which showed no complicating features of the hip replacement.  She does not seem to be walking with assistive device.  She does not have a limp when I watched her walk.  She gets up out of a chair easily.  I usually put her right hip through internal and external rotation with no discomfort or blocks or rotation.  She has a little bit of pain over the proximal IT band area and the lateral aspect of the hip but it is not severe.  Hopefully this will continue to improve with time since she is making good improvements we can see her back as needed.  However if things worsen she knows to let us know.

## 2022-06-10 ENCOUNTER — Encounter: Payer: Self-pay | Admitting: Physical Therapy

## 2022-06-10 ENCOUNTER — Ambulatory Visit: Payer: Medicare Other | Admitting: Physical Therapy

## 2022-06-10 DIAGNOSIS — R2681 Unsteadiness on feet: Secondary | ICD-10-CM

## 2022-06-10 DIAGNOSIS — R29898 Other symptoms and signs involving the musculoskeletal system: Secondary | ICD-10-CM

## 2022-06-10 DIAGNOSIS — M6281 Muscle weakness (generalized): Secondary | ICD-10-CM

## 2022-06-10 DIAGNOSIS — M79604 Pain in right leg: Secondary | ICD-10-CM | POA: Diagnosis not present

## 2022-06-10 NOTE — Therapy (Signed)
OUTPATIENT PHYSICAL THERAPY TREATMENT   Patient Name: Anna [name] MRN: [id number] DOB:1945/02/15, 77 y.o., female Today's Date: 06/10/2022      END OF SESSION:  PT End of Session - 06/10/22 0844     Visit Number 10    Number of Visits 13    Date for PT Re-Evaluation 06/17/22    Authorization Type UHC MCR    Authorization Time Period 05/06/22 to 06/17/22    Progress Note Due on Visit 18    PT Start Time 0845    PT Stop Time 0923    PT Time Calculation (min) 38 min    Activity Tolerance Patient tolerated treatment well    Behavior During Therapy WFL for tasks assessed/performed                      Past Medical History:  Diagnosis Date   Arthritis    Breast cancer [facility])    age 12   Family history of adverse reaction to anesthesia    Son takes longer to wake   Headache    Hyperlipidemia    Osteopenia    Vision abnormalities    Past Surgical History:  Procedure Laterality Date   BACK SURGERY     lumbar region   BUNIONECTOMY Bilateral    MASTECTOMY Left    at age 78   TOTAL HIP ARTHROPLASTY Right 04/22/2021   Procedure: Right TOTAL HIP ARTHROPLASTY ANTERIOR APPROACH;  Surgeon: Kathryne Hitch, MD;  Location: [facility] OR;  Service: Orthopedics;  Laterality: Right;   TRIGGER FINGER RELEASE Left    left thumb   TUBAL LIGATION     Patient Active Problem List   Diagnosis Date Noted   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[facility]) joint of left thumb 06/10/2021   Digital mucous cyst of finger 06/10/2021   Status post total replacement of right hip 04/22/2021   Mixed hyperlipidemia 02/06/2021   Musculoskeletal chest pain 02/06/2021   Obesity (BMI 30-39.9) 02/06/2021   Unilateral primary osteoarthritis, right hip 11/06/2020   Unilateral primary osteoarthritis, right knee 11/06/2020   Chest pain of uncertain etiology 08/29/2020   Cardiac murmur 08/29/2020   Mixed dyslipidemia 08/29/2020   Arthritis 08/16/2020   Breast cancer (HCC) 08/16/2020   Headache  08/16/2020   Osteopenia 08/16/2020   Vision abnormalities 08/16/2020   Chronic right shoulder pain 03/07/2018   Chronic migraine 08/11/2017    PCP: Merri Brunette MD   REFERRING PROVIDER: Kathryne Hitch, MD  REFERRING DIAG: [phone] (ICD-10-CM) - History of right hip replacement  THERAPY DIAG:  Pain in right leg  Other symptoms and signs involving the musculoskeletal system  Muscle weakness (generalized)  Unsteadiness on feet  Rationale for Evaluation and Treatment: Rehabilitation  ONSET DATE: 04/27/2022  SUBJECTIVE:   SUBJECTIVE STATEMENT: No pain today; able to lie on Rt side for about 5 min at this time.   PERTINENT HISTORY: The patient comes in today at around a year status post a right total hip arthroplasty.  She still has some pain over the IT band and some in the groin but mainly just her incision.  She is an active 77 year old female.  She is walking without assistive device.  She actually was seen in the office in early February and x-rays were obtained then of her pelvis and right hip and I did see these and it showed a well-seated implant with no complicating features.   On exam her right hip moves smoothly and fluidly.  Her pain is  mainly over the IT band and the trochanteric area.  There is a little bit of pain in the groin but this is more incisional pain.  She has tried Voltaren gel for this.   I would like to send her to outpatient physical therapy for any modalities that the therapist can try on her right hip IT band and groin area with no restrictions from an orthopedic standpoint.  I will try a steroid taper as well and would like to see her back in about 6 weeks.  She agrees with this treatment plan.   PAIN:  Are you having pain?  NPRS scale: 0/10 Pain location:  Pain description:  Aggravating factors:  Relieving factors:    PRECAUTIONS: None  WEIGHT BEARING RESTRICTIONS: No  FALLS:  Has patient fallen in last 6 months? No  LIVING  ENVIRONMENT: Lives with: lives alone Lives in: House/apartment Stairs: 4-5 steps in front but doesn't usually go this way, preferred entrance has 1 step  Has following equipment at home:  hurry-cane   OCCUPATION: retired   PLOF: Independent, Independent with basic ADLs, Independent with gait, and Independent with transfers  PATIENT GOALS: get rid of pain, be able to lay on right side comfortably   OBJECTIVE:   DIAGNOSTIC FINDINGS: Radiographs AP pelvis right hip demonstrate findings consistent with right  hip replacement.  Good congruent spacing between the acetabular and  femoral head components.  No evidence of any loosening or periprosthetic  fracture  PATIENT SURVEYS:  05/06/22: FOTO 77, predicted 77 06/03/22: FOTO 59   SENSATION: Not tested "maybe some numbness down the outside of that leg"   EDEMA:  Reports some edema around hip   MUSCLE LENGTH: 05/27/22: Hamstrings: mild limitation bil Piriformis: mild limitation bil    EVAL: Quads: severe limitation B; 06/03/22 severe limitation B  Hip flexors: moderate limitation B; 06/03/22 moderate limitation B Hamstrings: moderate limitation B; 06/03/22 mild limitation B  Piriformis: severe limitation R, moderate limitation L ; 06/03/22 mild limitation B   POSTURE: rounded shoulders, forward head, increased thoracic kyphosis, and flexed trunk   PALPATION: Upper R glute tender and with some deep trigger points, very TTP lateral R thigh   LOWER EXTREMITY MMT:  MMT Right eval Left eval Right 05/22/22 Left 05/22/22 Right 06/03/22 Left 06/03/22  Hip flexion 3- 3- 4 4 4 4   Hip extension 3- 3- 3- 3- 3- 3-  Hip abduction 4 3+ 4+ 3 4+ 3+  Knee flexion 4 4 4+ 4+ 5 5  Knee extension 4+ 4+ 4+ 4+ 5 4+  Ankle dorsiflexion 5 5   5 5    (Blank rows = not tested)  FUNCTIONAL TESTS:  05/06/22: Dynamic Gait Index: 18/24   06/03/22 Dynamic Gait Index 20/24  GAIT: Distance walked: in clinic distances  Assistive device utilized: None Level of  assistance: Complete Independence Comments: flexed at hips, + trendelenburg    TODAY'S TREATMENT:  DATE:  06/10/22 TherEx NuStep L6 x 8 min Forward step ups onto 6" step x 10 bil Lateral step ups onto 6" step x 10 bil Supine piriformis stretch 3x30 sec bil Hooklying single limb clamshell 2x10 bil; L4 band Sit to/from stand x10 reps without UE support Leg press 112# 3x10  06/08/22 TherEx NuStep L6 x 8 min Forward step ups onto 6" step x 10 bil Lateral step ups onto 6" step x 10 bil Standing hip abduction 2x10 bil; L4 band Standing hip extension 2x10 bil; L4 band Standing calf raises x20 reps Leg press 106# 3x10 Single knee to chest 3x30 sec bil Supine piriformis stretch 3x30 sec bil Bridges 2x10; 5 sec hold   06/03/22  FOTO 59  Objective measures for progress note prior to MD visit  Goal review/appropriate education  TherEx  Bridges with green TB above knees x15 with 3 second holds  Supine hip ABD with green TB above knees 2x12 B Hip flexor stretch off edge of table 4x15 seconds B    06/01/22  TherEx  Nustep L6x6 minutes BLEs only 3 way hip green TB x10 B Hip hikes with green TB for resistance x12 B Hip hikes + side step 3 rounds (about 33ft each round)   NMR  Tandem stance blue foam pad 3x30 seconds B 3 way taps off of blue foam pad x10 B Standing marches blue foam pad x20 total     PATIENT EDUCATION:  Education details: exam findings, POC, HEP  Person educated: Patient Education method: Programmer, multimedia, Facilities manager, and Handouts Education comprehension: verbalized understanding, returned demonstration, and needs further education  HOME EXERCISE PROGRAM:  Access Code: [email or web address] URL: [email or web address] Date: 06/01/2022 Prepared by: Nedra Hai  Exercises - Supine Bridge with Resistance Band  - 2 x daily - 7 x  weekly - 1 sets - 10 reps - 3 hold - Hooklying Clamshell with Resistance  - 2 x daily - 7 x weekly - 1 sets - 10 reps - 3 hold - Seated Figure 4 Piriformis Stretch  - 2 x daily - 7 x weekly - 1 sets - 3 reps - 30 hold - Seated Hamstring Stretch  - 2 x daily - 7 x weekly - 1 sets - 3 reps - 30 hold - Staggered Bridge  - 2 x daily - 7 x weekly - 1 sets - 10 reps - 2 hold - Bridge Walk Out  - 2 x daily - 7 x weekly - 1 sets - 10 reps - 3 hold - Standing 3-way Hip with Walker  - 2 x daily - 7 x weekly - 1 sets - 10 reps - 1 hold - Standing Hip Hiking  - 2 x daily - 7 x weekly - 1 sets - 10 reps - 1 hold  ASSESSMENT:  CLINICAL IMPRESSION: Pt without pain throughout session, and anticipate d/c next week.  She is to let us know if there's any concerns over the next session or two.  OBJECTIVE IMPAIRMENTS: Abnormal gait, decreased balance, difficulty walking, decreased strength, increased fascial restrictions, increased muscle spasms, impaired flexibility, improper body mechanics, postural dysfunction, and pain.   ACTIVITY LIMITATIONS: standing, squatting, stairs, transfers, and locomotion level  PARTICIPATION LIMITATIONS: cleaning, laundry, driving, shopping, community activity, occupation, and yard work  PERSONAL FACTORS: Age, Fitness, Past/current experiences, and Time since onset of injury/illness/exacerbation are also affecting patient's functional outcome.   REHAB POTENTIAL: Good  CLINICAL DECISION MAKING: Stable/uncomplicated  EVALUATION COMPLEXITY: Low   GOALS: Goals reviewed with patient? Yes  SHORT TERM GOALS: Target date: 05/27/2022   Will be compliant with appropriate progressive HEP  Goal status: MET 05/22/22  2.  Pain when laying on right side to be no more than 2/10 at worst  Goal status: IN PROGRESS 06/03/22- returned to 7/10 pain when laying on R side   3.  Muscle flexibility to be no more than 25% limited in all tight groups  Goal status: PARTIALLY MET 06/03/22  4.  Will  be independent with soft tissue spasm management techniques including foam roller or tennis ball massage  Goal status: MET 05/27/22   LONG TERM GOALS: Target date: 06/17/2022    MMT to improve by at least 1 grade in all weak groups  Baseline:  Goal status: IN PROGRESS 06/03/22  2.  Will score at least 22/24 on DGI  Baseline:  Goal status: IN PROGRESS 06/03/22- 20/24  3.  Will be able to ambulate at least 1 mile without increase in pain  Baseline:  Goal status: MET 06/03/22  4.  Will be compliant with appropriate gym based exercise program to maintain functional gains/prevent recurrence of condition  Baseline:  Goal status: IN PROGRESS 06/03/22    PLAN:  PT FREQUENCY: 2x/week  PT DURATION: 6 weeks  PLANNED INTERVENTIONS: Therapeutic exercises, Therapeutic activity, Neuromuscular re-education, Balance training, Gait training, Patient/Family education, Self Care, Joint mobilization, Stair training, Aquatic Therapy, Dry Needling, Electrical stimulation, Cryotherapy, Moist heat, Taping, Ultrasound, Ionotophoresis 4mg /ml Dexamethasone, Manual therapy, and Re-evaluation  PLAN FOR NEXT SESSION:  start checking goals   NEXT MD VISIT: Dr. Magnus Ivan May 6th    Clarita Crane, PT, DPT 06/10/22 9:25 AM

## 2022-06-12 ENCOUNTER — Encounter: Payer: Medicare Other | Admitting: Physical Therapy

## 2022-06-17 ENCOUNTER — Encounter: Payer: Self-pay | Admitting: Physical Therapy

## 2022-06-17 ENCOUNTER — Ambulatory Visit: Payer: Medicare Other | Admitting: Physical Therapy

## 2022-06-17 DIAGNOSIS — M79604 Pain in right leg: Secondary | ICD-10-CM | POA: Diagnosis not present

## 2022-06-17 DIAGNOSIS — M6281 Muscle weakness (generalized): Secondary | ICD-10-CM

## 2022-06-17 DIAGNOSIS — R29898 Other symptoms and signs involving the musculoskeletal system: Secondary | ICD-10-CM

## 2022-06-17 DIAGNOSIS — R2681 Unsteadiness on feet: Secondary | ICD-10-CM

## 2022-06-17 NOTE — Therapy (Signed)
OUTPATIENT PHYSICAL THERAPY TREATMENT   Patient Name: Anna [name] MRN: [id number] DOB:July 25, 1945, 77 y.o., female Today's Date: 06/17/2022      END OF SESSION:  PT End of Session - 06/17/22 0850     Visit Number 11    Number of Visits 13    Date for PT Re-Evaluation 06/17/22    Authorization Type U[employer] MCR    Authorization Time Period 05/06/22 to 06/17/22    Progress Note Due on Visit 18    PT Start Time 0845    PT Stop Time 0924    PT Time Calculation (min) 39 min    Activity Tolerance Patient tolerated treatment well    Behavior During Therapy [facility] for tasks assessed/performed                       Past Medical History:  Diagnosis Date   Arthritis    Breast cancer [facility])    age 65   Family history of adverse reaction to anesthesia    Son takes longer to wake   Headache    Hyperlipidemia    Osteopenia    Vision abnormalities    Past Surgical History:  Procedure Laterality Date   BACK SURGERY     lumbar region   BUNIONECTOMY Bilateral    MASTECTOMY Left    at age 7   TOTAL HIP ARTHROPLASTY Right 04/22/2021   Procedure: Right TOTAL HIP ARTHROPLASTY ANTERIOR APPROACH;  Surgeon: Kathryne Hitch, MD;  Location: [facility] OR;  Service: Orthopedics;  Laterality: Right;   TRIGGER FINGER RELEASE Left    left thumb   TUBAL LIGATION     Patient Active Problem List   Diagnosis Date Noted   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[facility]) joint of left thumb 06/10/2021   Digital mucous cyst of finger 06/10/2021   Status post total replacement of right hip 04/22/2021   Mixed hyperlipidemia 02/06/2021   Musculoskeletal chest pain 02/06/2021   Obesity (BMI 30-39.9) 02/06/2021   Unilateral primary osteoarthritis, right hip 11/06/2020   Unilateral primary osteoarthritis, right knee 11/06/2020   Chest pain of uncertain etiology 08/29/2020   Cardiac murmur 08/29/2020   Mixed dyslipidemia 08/29/2020   Arthritis 08/16/2020   Breast cancer (HCC) 08/16/2020   Headache  08/16/2020   Osteopenia 08/16/2020   Vision abnormalities 08/16/2020   Chronic right shoulder pain 03/07/2018   Chronic migraine 08/11/2017    PCP: Merri Brunette MD   REFERRING PROVIDER: Kathryne Hitch, MD  REFERRING DIAG: [phone] (ICD-10-CM) - History of right hip replacement  THERAPY DIAG:  Pain in right leg  Other symptoms and signs involving the musculoskeletal system  Muscle weakness (generalized)  Unsteadiness on feet  Rationale for Evaluation and Treatment: Rehabilitation  ONSET DATE: 04/27/2022  SUBJECTIVE:   SUBJECTIVE STATEMENT: Doing pretty well; feels lying down on Rt side is improved  PERTINENT HISTORY: The patient comes in today at around a year status post a right total hip arthroplasty.  She still has some pain over the IT band and some in the groin but mainly just her incision.  She is an active 77 year old female.  She is walking without assistive device.  She actually was seen in the office in early February and x-rays were obtained then of her pelvis and right hip and I did see these and it showed a well-seated implant with no complicating features.   On exam her right hip moves smoothly and fluidly.  Her pain is mainly over the IT band  and the trochanteric area.  There is a little bit of pain in the groin but this is more incisional pain.  She has tried Voltaren gel for this.   I would like to send her to outpatient physical therapy for any modalities that the therapist can try on her right hip IT band and groin area with no restrictions from an orthopedic standpoint.  I will try a steroid taper as well and would like to see her back in about 6 weeks.  She agrees with this treatment plan.   PAIN:  Are you having pain?  NPRS scale: 0/10 Pain location:  Pain description:  Aggravating factors:  Relieving factors:    PRECAUTIONS: None  WEIGHT BEARING RESTRICTIONS: No  FALLS:  Has patient fallen in last 6 months? No  LIVING  ENVIRONMENT: Lives with: lives alone Lives in: House/apartment Stairs: 4-5 steps in front but doesn't usually go this way, preferred entrance has 1 step  Has following equipment at home:  hurry-cane   OCCUPATION: retired   PLOF: Independent, Independent with basic ADLs, Independent with gait, and Independent with transfers  PATIENT GOALS: get rid of pain, be able to lay on right side comfortably   OBJECTIVE:   DIAGNOSTIC FINDINGS: Radiographs AP pelvis right hip demonstrate findings consistent with right  hip replacement.  Good congruent spacing between the acetabular and  femoral head components.  No evidence of any loosening or periprosthetic  fracture  PATIENT SURVEYS:  05/06/22: FOTO 77, predicted 77 06/03/22: FOTO 59   SENSATION: Not tested "maybe some numbness down the outside of that leg"   EDEMA:  Reports some edema around hip   MUSCLE LENGTH: 05/27/22: Hamstrings: mild limitation bil Piriformis: mild limitation bil    EVAL: Quads: severe limitation B; 06/03/22 severe limitation B  Hip flexors: moderate limitation B; 06/03/22 moderate limitation B Hamstrings: moderate limitation B; 06/03/22 mild limitation B  Piriformis: severe limitation R, moderate limitation L ; 06/03/22 mild limitation B   POSTURE: rounded shoulders, forward head, increased thoracic kyphosis, and flexed trunk   PALPATION: Upper R glute tender and with some deep trigger points, very TTP lateral R thigh   LOWER EXTREMITY MMT:  MMT Right eval Left eval Right 05/22/22 Left 05/22/22 Right 06/03/22 Left 06/03/22  Hip flexion 3- 3- 4 4 4 4   Hip extension 3- 3- 3- 3- 3- 3-  Hip abduction 4 3+ 4+ 3 4+ 3+  Knee flexion 4 4 4+ 4+ 5 5  Knee extension 4+ 4+ 4+ 4+ 5 4+  Ankle dorsiflexion 5 5   5 5    (Blank rows = not tested)  FUNCTIONAL TESTS:  05/06/22: Dynamic Gait Index: 18/24   06/03/22 Dynamic Gait Index 20/24  GAIT: Distance walked: in clinic distances  Assistive device utilized: None Level of  assistance: Complete Independence Comments: flexed at hips, + trendelenburg    TODAY'S TREATMENT:  DATE:  06/17/22 TherEx NuStep L6 x 8 min Standing hip abduction 2x10 bil; L3 band Standing hip extension 2x10 bil; L3 band  Squats 2x10 Deadlifts with bil 5# dumbbells 2x10 Single knee to chest 10 x 5 sec hold Piriformis stretch 3x30 sec bil Lower trunk rotation 10 x 5 sec hold bil Bridges 2x10; 5 sec hold Seated hamstring stretch 3x30 sec bil Sit to/from stand x 10 reps without UE support   06/10/22 TherEx NuStep L6 x 8 min Forward step ups onto 6" step x 10 bil Lateral step ups onto 6" step x 10 bil Supine piriformis stretch 3x30 sec bil Hooklying single limb clamshell 2x10 bil; L4 band Sit to/from stand x10 reps without UE support Leg press 112# 3x10  06/08/22 TherEx NuStep L6 x 8 min Forward step ups onto 6" step x 10 bil Lateral step ups onto 6" step x 10 bil Standing hip abduction 2x10 bil; L4 band Standing hip extension 2x10 bil; L4 band Standing calf raises x20 reps Leg press 106# 3x10 Single knee to chest 3x30 sec bil Supine piriformis stretch 3x30 sec bil Bridges 2x10; 5 sec hold   06/03/22  FOTO 59  Objective measures for progress note prior to MD visit  Goal review/appropriate education  TherEx  Bridges with green TB above knees x15 with 3 second holds  Supine hip ABD with green TB above knees 2x12 B Hip flexor stretch off edge of table 4x15 seconds B    06/01/22  TherEx  Nustep L6x6 minutes BLEs only 3 way hip green TB x10 B Hip hikes with green TB for resistance x12 B Hip hikes + side step 3 rounds (about 54ft each round)   NMR  Tandem stance blue foam pad 3x30 seconds B 3 way taps off of blue foam pad x10 B Standing marches blue foam pad x20 total     PATIENT EDUCATION:  Education details: exam findings, POC,  HEP  Person educated: Patient Education method: Programmer, multimedia, Facilities manager, and Handouts Education comprehension: verbalized understanding, returned demonstration, and needs further education  HOME EXERCISE PROGRAM: Access Code: [email or web address] URL: [email or web address] Date: 06/17/2022 Prepared by: Moshe Cipro  Exercises - Supine Bridge with Resistance Band  - 2 x daily - 7 x weekly - 1 sets - 10 reps - 3 hold - Hooklying Clamshell with Resistance  - 2 x daily - 7 x weekly - 1 sets - 10 reps - 3 hold - Seated Figure 4 Piriformis Stretch  - 2 x daily - 7 x weekly - 1 sets - 3 reps - 30 hold - Seated Hamstring Stretch  - 2 x daily - 7 x weekly - 1 sets - 3 reps - 30 hold - Staggered Bridge  - 2 x daily - 7 x weekly - 1 sets - 10 reps - 2 hold - Bridge Walk Out  - 2 x daily - 7 x weekly - 1 sets - 10 reps - 3 hold - Standing Hip Hiking  - 2 x daily - 7 x weekly - 1 sets - 10 reps - 1 hold - Standing Hip Abduction with Resistance at Ankles and Counter Support  - 1 x daily - 7 x weekly - 2-3 sets - 10 reps - Standing Hip Extension with Resistance at Ankles and Counter Support  - 1 x daily - 7 x weekly - 2-3 sets - 10 reps - Mini Squat with Counter Support  - 1 x daily - 7 x weekly - 2-3 sets - 10 reps -  Kettlebell Deadlift  - 1 x daily - 7 x weekly - 2-3 sets - 10 reps  ASSESSMENT:  CLINICAL IMPRESSION: Pt tolerated session well today with updating HEP.  Plan for d/c next visit.   OBJECTIVE IMPAIRMENTS: Abnormal gait, decreased balance, difficulty walking, decreased strength, increased fascial restrictions, increased muscle spasms, impaired flexibility, improper body mechanics, postural dysfunction, and pain.   ACTIVITY LIMITATIONS: standing, squatting, stairs, transfers, and locomotion level  PARTICIPATION LIMITATIONS: cleaning, laundry, driving, shopping, community activity, occupation, and yard work  PERSONAL FACTORS: Age, Fitness, Past/current experiences, and Time  since onset of injury/illness/exacerbation are also affecting patient's functional outcome.   REHAB POTENTIAL: Good  CLINICAL DECISION MAKING: Stable/uncomplicated  EVALUATION COMPLEXITY: Low   GOALS: Goals reviewed with patient? Yes  SHORT TERM GOALS: Target date: 05/27/2022   Will be compliant with appropriate progressive HEP  Goal status: MET 05/22/22  2.  Pain when laying on right side to be no more than 2/10 at worst  Goal status: IN PROGRESS 06/03/22- returned to 7/10 pain when laying on R side   3.  Muscle flexibility to be no more than 25% limited in all tight groups  Goal status: PARTIALLY MET 06/03/22  4.  Will be independent with soft tissue spasm management techniques including foam roller or tennis ball massage  Goal status: MET 05/27/22   LONG TERM GOALS: Target date: 06/17/2022    MMT to improve by at least 1 grade in all weak groups  Baseline:  Goal status: IN PROGRESS 06/03/22  2.  Will score at least 22/24 on DGI  Baseline:  Goal status: IN PROGRESS 06/03/22- 20/24  3.  Will be able to ambulate at least 1 mile without increase in pain  Baseline:  Goal status: MET 06/03/22  4.  Will be compliant with appropriate gym based exercise program to maintain functional gains/prevent recurrence of condition  Baseline:  Goal status: IN PROGRESS 06/03/22    PLAN:  PT FREQUENCY: 2x/week  PT DURATION: 6 weeks  PLANNED INTERVENTIONS: Therapeutic exercises, Therapeutic activity, Neuromuscular re-education, Balance training, Gait training, Patient/Family education, Self Care, Joint mobilization, Stair training, Aquatic Therapy, Dry Needling, Electrical stimulation, Cryotherapy, Moist heat, Taping, Ultrasound, Ionotophoresis 4mg /ml Dexamethasone, Manual therapy, and Re-evaluation  PLAN FOR NEXT SESSION:  plan for d/c  NEXT MD VISIT: PRN    Clarita Crane, PT, DPT 06/17/22 9:25 AM

## 2022-06-19 ENCOUNTER — Encounter: Payer: Self-pay | Admitting: Physical Therapy

## 2022-06-19 ENCOUNTER — Ambulatory Visit: Payer: Medicare Other | Admitting: Physical Therapy

## 2022-06-19 DIAGNOSIS — R29898 Other symptoms and signs involving the musculoskeletal system: Secondary | ICD-10-CM

## 2022-06-19 DIAGNOSIS — M79604 Pain in right leg: Secondary | ICD-10-CM

## 2022-06-19 DIAGNOSIS — R2681 Unsteadiness on feet: Secondary | ICD-10-CM

## 2022-06-19 DIAGNOSIS — M6281 Muscle weakness (generalized): Secondary | ICD-10-CM

## 2022-06-19 NOTE — Therapy (Signed)
OUTPATIENT PHYSICAL THERAPY TREATMENT RECERTIFICATION DISCHARGE SUMMARY   Patient Name: Anna [name] MRN: [id number] DOB:Jun 23, 1945, 77 y.o., female Today's Date: 06/19/2022      END OF SESSION:  PT End of Session - 06/19/22 0929     Visit Number 12    Date for PT Re-Evaluation 06/19/22    Authorization Type UHC MCR    Authorization Time Period 05/06/22 to 06/17/22    Progress Note Due on Visit 18    PT Start Time 0926    PT Stop Time 0951    PT Time Calculation (min) 25 min    Activity Tolerance Patient tolerated treatment well    Behavior During Therapy [facility] for tasks assessed/performed                        Past Medical History:  Diagnosis Date   Arthritis    Breast cancer [facility])    age 36   Family history of adverse reaction to anesthesia    Son takes longer to wake   Headache    Hyperlipidemia    Osteopenia    Vision abnormalities    Past Surgical History:  Procedure Laterality Date   BACK SURGERY     lumbar region   BUNIONECTOMY Bilateral    MASTECTOMY Left    at age 48   TOTAL HIP ARTHROPLASTY Right 04/22/2021   Procedure: Right TOTAL HIP ARTHROPLASTY ANTERIOR APPROACH;  Surgeon: Kathryne Hitch, MD;  Location: [facility] OR;  Service: Orthopedics;  Laterality: Right;   TRIGGER FINGER RELEASE Left    left thumb   TUBAL LIGATION     Patient Active Problem List   Diagnosis Date Noted   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[facility]) joint of left thumb 06/10/2021   Digital mucous cyst of finger 06/10/2021   Status post total replacement of right hip 04/22/2021   Mixed hyperlipidemia 02/06/2021   Musculoskeletal chest pain 02/06/2021   Obesity (BMI 30-39.9) 02/06/2021   Unilateral primary osteoarthritis, right hip 11/06/2020   Unilateral primary osteoarthritis, right knee 11/06/2020   Chest pain of uncertain etiology 08/29/2020   Cardiac murmur 08/29/2020   Mixed dyslipidemia 08/29/2020   Arthritis 08/16/2020   Breast cancer (HCC) 08/16/2020    Headache 08/16/2020   Osteopenia 08/16/2020   Vision abnormalities 08/16/2020   Chronic right shoulder pain 03/07/2018   Chronic migraine 08/11/2017    PCP: Merri Brunette MD   REFERRING PROVIDER: Kathryne Hitch, MD  REFERRING DIAG: [phone] (ICD-10-CM) - History of right hip replacement  THERAPY DIAG:  Pain in right leg - Plan: PT plan of care cert/re-cert  Other symptoms and signs involving the musculoskeletal system - Plan: PT plan of care cert/re-cert  Muscle weakness (generalized) - Plan: PT plan of care cert/re-cert  Unsteadiness on feet - Plan: PT plan of care cert/re-cert  Rationale for Evaluation and Treatment: Rehabilitation  ONSET DATE: 04/27/2022  SUBJECTIVE:   SUBJECTIVE STATEMENT: Doing well; denies any pain today  PERTINENT HISTORY: The patient comes in today at around a year status post a right total hip arthroplasty.  She still has some pain over the IT band and some in the groin but mainly just her incision.  She is an active 77 year old female.  She is walking without assistive device.  She actually was seen in the office in early February and x-rays were obtained then of her pelvis and right hip and I did see these and it showed a well-seated implant with no complicating features.  On exam her right hip moves smoothly and fluidly.  Her pain is mainly over the IT band and the trochanteric area.  There is a little bit of pain in the groin but this is more incisional pain.  She has tried Voltaren gel for this.   I would like to send her to outpatient physical therapy for any modalities that the therapist can try on her right hip IT band and groin area with no restrictions from an orthopedic standpoint.  I will try a steroid taper as well and would like to see her back in about 6 weeks.  She agrees with this treatment plan.   PAIN:  Are you having pain?  NPRS scale: 0/10 Pain location:  Pain description:  Aggravating factors:  Relieving factors:     PRECAUTIONS: None  WEIGHT BEARING RESTRICTIONS: No  FALLS:  Has patient fallen in last 6 months? No  LIVING ENVIRONMENT: Lives with: lives alone Lives in: House/apartment Stairs: 4-5 steps in front but doesn't usually go this way, preferred entrance has 1 step  Has following equipment at home:  hurry-cane   OCCUPATION: retired   PLOF: Independent, Independent with basic ADLs, Independent with gait, and Independent with transfers  PATIENT GOALS: get rid of pain, be able to lay on right side comfortably   OBJECTIVE:   DIAGNOSTIC FINDINGS: Radiographs AP pelvis right hip demonstrate findings consistent with right  hip replacement.  Good congruent spacing between the acetabular and  femoral head components.  No evidence of any loosening or periprosthetic  fracture  PATIENT SURVEYS:  05/06/22: FOTO 77, predicted 77 06/03/22: FOTO 59   SENSATION: Not tested "maybe some numbness down the outside of that leg"   EDEMA:  Reports some edema around hip   MUSCLE LENGTH: 05/27/22: Hamstrings: mild limitation bil Piriformis: mild limitation bil    EVAL: Quads: severe limitation B; 06/03/22 severe limitation B  Hip flexors: moderate limitation B; 06/03/22 moderate limitation B Hamstrings: moderate limitation B; 06/03/22 mild limitation B  Piriformis: severe limitation R, moderate limitation L ; 06/03/22 mild limitation B   POSTURE: rounded shoulders, forward head, increased thoracic kyphosis, and flexed trunk   PALPATION: Upper R glute tender and with some deep trigger points, very TTP lateral R thigh   LOWER EXTREMITY MMT:  MMT Right eval Left eval Right  /19/24 Left  05/22/22 Right  06/03/22 Left  06/03/22 Rt/Lt 06/19/22  Hip flexion 3- 3- 4 4 4 4    Hip extension 3- 3- 3- 3- 3- 3- 4/4  Hip abduction 4 3+ 4+ 3 4+ 3+ 4/4  Knee flexion 4 4 4+ 4+ 5 5   Knee extension 4+ 4+ 4+ 4+ 5 4+ 5/5  Ankle dorsiflexion 5 5   5 5     (Blank rows = not tested)  FUNCTIONAL TESTS:  05/06/22:  Dynamic Gait Index: 18/24   06/03/22 Dynamic Gait Index 20/24 06/19/22: Dynamic Gait Index: 22/24   [facility] PT Assessment - 06/19/22 0941       Dynamic Gait Index   Level Surface Normal    Change in Gait Speed Normal    Gait with Horizontal Head Turns Normal    Gait with Vertical Head Turns Mild Impairment    Gait and Pivot Turn Normal    Step Over Obstacle Normal    Step Around Obstacles Normal    Steps Mild Impairment    Total Score 22              GAIT: Distance walked:  in clinic distances  Assistive device utilized: None Level of assistance: Complete Independence Comments: flexed at hips, + trendelenburg    TODAY'S TREATMENT:                                                                                                                              DATE:  06/19/22 TherEx NuStep L6 x 8 min MMT - see above for details Discussed continued HEP and if pt were to return to gym appropriate gradual progressions of exercise  Physical Performance Test DGI performed - see above for details  06/17/22 TherEx NuStep L6 x 8 min Standing hip abduction 2x10 bil; L3 band Standing hip extension 2x10 bil; L3 band  Squats 2x10 Deadlifts with bil 5# dumbbells 2x10 Single knee to chest 10 x 5 sec hold Piriformis stretch 3x30 sec bil Lower trunk rotation 10 x 5 sec hold bil Bridges 2x10; 5 sec hold Seated hamstring stretch 3x30 sec bil Sit to/from stand x 10 reps without UE support   06/10/22 TherEx NuStep L6 x 8 min Forward step ups onto 6" step x 10 bil Lateral step ups onto 6" step x 10 bil Supine piriformis stretch 3x30 sec bil Hooklying single limb clamshell 2x10 bil; L4 band Sit to/from stand x10 reps without UE support Leg press 112# 3x10  06/08/22 TherEx NuStep L6 x 8 min Forward step ups onto 6" step x 10 bil Lateral step ups onto 6" step x 10 bil Standing hip abduction 2x10 bil; L4 band Standing hip extension 2x10 bil; L4 band Standing calf raises x20 reps Leg  press 106# 3x10 Single knee to chest 3x30 sec bil Supine piriformis stretch 3x30 sec bil Bridges 2x10; 5 sec hold   PATIENT EDUCATION:  Education details: exam findings, POC, HEP  Person educated: Patient Education method: Programmer, multimedia, Facilities manager, and Handouts Education comprehension: verbalized understanding, returned demonstration, and needs further education  HOME EXERCISE PROGRAM: Access Code: [email or web address] URL: [email or web address] Date: 06/17/2022 Prepared by: Moshe Cipro  Exercises - Supine Bridge with Resistance Band  - 2 x daily - 7 x weekly - 1 sets - 10 reps - 3 hold - Hooklying Clamshell with Resistance  - 2 x daily - 7 x weekly - 1 sets - 10 reps - 3 hold - Seated Figure 4 Piriformis Stretch  - 2 x daily - 7 x weekly - 1 sets - 3 reps - 30 hold - Seated Hamstring Stretch  - 2 x daily - 7 x weekly - 1 sets - 3 reps - 30 hold - Staggered Bridge  - 2 x daily - 7 x weekly - 1 sets - 10 reps - 2 hold - Bridge Walk Out  - 2 x daily - 7 x weekly - 1 sets - 10 reps - 3 hold - Standing Hip Hiking  - 2 x daily - 7 x weekly - 1 sets - 10 reps - 1 hold - Standing Hip Abduction with Resistance at Ankles  and Counter Support  - 1 x daily - 7 x weekly - 2-3 sets - 10 reps - Standing Hip Extension with Resistance at Ankles and Counter Support  - 1 x daily - 7 x weekly - 2-3 sets - 10 reps - Mini Squat with Counter Support  - 1 x daily - 7 x weekly - 2-3 sets - 10 reps - Kettlebell Deadlift  - 1 x daily - 7 x weekly - 2-3 sets - 10 reps  ASSESSMENT:  CLINICAL IMPRESSION: Pt has met/partially met all LTGs at this time and is ready for d/c.  She has well established HEP to continue.  Will d/c PT today.   OBJECTIVE IMPAIRMENTS: Abnormal gait, decreased balance, difficulty walking, decreased strength, increased fascial restrictions, increased muscle spasms, impaired flexibility, improper body mechanics, postural dysfunction, and pain.   ACTIVITY LIMITATIONS:  standing, squatting, stairs, transfers, and locomotion level  PARTICIPATION LIMITATIONS: cleaning, laundry, driving, shopping, community activity, occupation, and yard work  PERSONAL FACTORS: Age, Fitness, Past/current experiences, and Time since onset of injury/illness/exacerbation are also affecting patient's functional outcome.   REHAB POTENTIAL: Good  CLINICAL DECISION MAKING: Stable/uncomplicated  EVALUATION COMPLEXITY: Low   GOALS: Goals reviewed with patient? Yes  SHORT TERM GOALS: Target date: 05/27/2022   Will be compliant with appropriate progressive HEP  Goal status: MET 05/22/22  2.  Pain when laying on right side to be no more than 2/10 at worst  Goal status: IN PROGRESS 06/03/22- returned to 7/10 pain when laying on R side   3.  Muscle flexibility to be no more than 25% limited in all tight groups  Goal status: PARTIALLY MET 06/03/22  4.  Will be independent with soft tissue spasm management techniques including foam roller or tennis ball massage  Goal status: MET 05/27/22   LONG TERM GOALS: Target date: 06/19/2022    MMT to improve by at least 1 grade in all weak groups  Baseline:  Goal status: MET 06/19/22  2.  Will score at least 22/24 on DGI  Baseline:  Goal status: MET 06/19/22  3.  Will be able to ambulate at least 1 mile without increase in pain  Baseline:  Goal status: MET 06/03/22  4.  Will be compliant with appropriate gym based exercise program to maintain functional gains/prevent recurrence of condition  Baseline:  Goal status: REVISED 06/19/22; met with HEP; pt not ready to return to gym post-COVID    PLAN:  PT FREQUENCY: 2x/week  PT DURATION: 6 weeks  PLANNED INTERVENTIONS: Therapeutic exercises, Therapeutic activity, Neuromuscular re-education, Balance training, Gait training, Patient/Family education, Self Care, Joint mobilization, Stair training, Aquatic Therapy, Dry Needling, Electrical stimulation, Cryotherapy, Moist heat, Taping,  Ultrasound, Ionotophoresis 4mg /ml Dexamethasone, Manual therapy, and Re-evaluation  PLAN FOR NEXT SESSION: d/c PT today  NEXT MD VISIT: PRN    Clarita Crane, PT, DPT 06/19/22 9:59 AM   PHYSICAL THERAPY DISCHARGE SUMMARY  Visits from Start of Care: 12  Current functional level related to goals / functional outcomes: See above   Remaining deficits: See above   Education / Equipment: HEP   Patient agrees to discharge. Patient goals were partially met. Patient is being discharged due to being pleased with the current functional level.   Clarita Crane, PT, DPT 06/19/22 9:59 AM  [facility] [address] [place], [place], [address] Phone: [phone]   Fax:  [phone]

## 2022-07-07 DIAGNOSIS — M67441 Ganglion, right hand: Secondary | ICD-10-CM | POA: Diagnosis not present

## 2022-07-07 DIAGNOSIS — E7800 Pure hypercholesterolemia, unspecified: Secondary | ICD-10-CM | POA: Diagnosis not present

## 2022-07-07 DIAGNOSIS — E039 Hypothyroidism, unspecified: Secondary | ICD-10-CM | POA: Diagnosis not present

## 2022-07-07 DIAGNOSIS — M1812 Unilateral primary osteoarthritis of first carpometacarpal joint, left hand: Secondary | ICD-10-CM | POA: Diagnosis not present

## 2022-07-07 DIAGNOSIS — D509 Iron deficiency anemia, unspecified: Secondary | ICD-10-CM | POA: Diagnosis not present

## 2022-07-07 DIAGNOSIS — M65342 Trigger finger, left ring finger: Secondary | ICD-10-CM | POA: Diagnosis not present

## 2022-07-24 DIAGNOSIS — M25561 Pain in right knee: Secondary | ICD-10-CM | POA: Diagnosis not present

## 2022-08-13 ENCOUNTER — Other Ambulatory Visit (INDEPENDENT_AMBULATORY_CARE_PROVIDER_SITE_OTHER): Payer: Medicare Other

## 2022-08-13 ENCOUNTER — Ambulatory Visit: Payer: Medicare Other | Admitting: Physician Assistant

## 2022-08-13 ENCOUNTER — Encounter: Payer: Self-pay | Admitting: Physician Assistant

## 2022-08-13 DIAGNOSIS — M25561 Pain in right knee: Secondary | ICD-10-CM | POA: Diagnosis not present

## 2022-08-13 DIAGNOSIS — M25562 Pain in left knee: Secondary | ICD-10-CM

## 2022-08-13 MED ORDER — METHYLPREDNISOLONE ACETATE 40 MG/ML IJ SUSP
40.0000 mg | INTRAMUSCULAR | Status: AC | PRN
  Administered 2022-08-13: 40 mg via INTRA_ARTICULAR

## 2022-08-13 MED ORDER — LIDOCAINE HCL 1 % IJ SOLN
3.0000 mL | INTRAMUSCULAR | Status: AC | PRN
  Administered 2022-08-13: 3 mL

## 2022-08-13 NOTE — Progress Notes (Signed)
HPI: Anna [name] comes in today with bilateral knee pain for the last month no injury.  She had no fevers chills.  She does note that the left knee is more painful than the right.  She rates her left knee to be 7 out of 10 pain at worst right knee to be 6 out of 10 pain at worst.  She also notes giving way of the left knee only.  Otherwise no mechanical symptoms of either knee.  No swelling of the left knee.  She is taking Tylenol and using topical creams that helps some.  She uses no assistive device to ambulate.  Review of systems: See HPI otherwise negative or noncontributory.  Physical exam: General well-developed well-nourished female able to get on and off the exam table on her own. Respirations: No respiratory distress. Bilateral knees good range of motion of both knees.  Tenderness along medial joint line of both knees no instability valgus varus stressing of either knee.  No abnormal warmth erythema or effusion of either knee.  Radiographs: Left knee near bone-on-bone in the medial compartment.  Chondrocalcinosis.  Moderately severe arthritic changes of the patellofemoral joint.  Knee is well located.  No acute fractures.  No bony abnormalities otherwise or lesions.  Right knee 2 views: Knee is well located.  Moderate narrowing medial joint line.  Lateral joint line overall well-preserved.  Moderate patellofemoral arthritis.  Impression: Left knee arthritis Right knee arthritis  Plan: Recommend trying cortisone injections both knees she is agreeable.  She understands that she needs help she can have injections every 3 months if needed.  She will follow-up with [place] as needed.      Procedure Note  Patient: Anna [name]             Date of Birth: 01-24-1946           MRN: [id number]             Visit Date: 08/13/2022  Procedures: Visit Diagnoses:  1. Acute pain of left knee   2. Acute pain of right knee     Large Joint Inj: bilateral knee on 08/13/2022 5:07 PM Indications:  pain Details: 22 G 1.5 in needle, anterolateral approach  Arthrogram: No  Medications (Right): 3 mL lidocaine 1 %; 40 mg methylPREDNISolone acetate 40 MG/ML Medications (Left): 3 mL lidocaine 1 %; 40 mg methylPREDNISolone acetate 40 MG/ML Outcome: tolerated well, no immediate complications Procedure, treatment alternatives, risks and benefits explained, specific risks discussed. Consent was given by the patient. Immediately prior to procedure a time out was called to verify the correct patient, procedure, equipment, support staff and site/side marked as required. Patient was prepped and draped in the usual sterile fashion.

## 2022-08-20 DIAGNOSIS — M5412 Radiculopathy, cervical region: Secondary | ICD-10-CM | POA: Diagnosis not present

## 2022-08-28 DIAGNOSIS — M5412 Radiculopathy, cervical region: Secondary | ICD-10-CM | POA: Diagnosis not present

## 2022-09-03 ENCOUNTER — Other Ambulatory Visit: Payer: Self-pay | Admitting: Cardiology

## 2022-09-08 DIAGNOSIS — M1812 Unilateral primary osteoarthritis of first carpometacarpal joint, left hand: Secondary | ICD-10-CM | POA: Diagnosis not present

## 2022-09-08 DIAGNOSIS — M65342 Trigger finger, left ring finger: Secondary | ICD-10-CM | POA: Diagnosis not present

## 2022-09-17 DIAGNOSIS — M5412 Radiculopathy, cervical region: Secondary | ICD-10-CM | POA: Diagnosis not present

## 2022-10-20 DIAGNOSIS — U099 Post covid-19 condition, unspecified: Secondary | ICD-10-CM | POA: Diagnosis not present

## 2022-10-20 DIAGNOSIS — R053 Chronic cough: Secondary | ICD-10-CM | POA: Diagnosis not present

## 2022-10-23 DIAGNOSIS — R053 Chronic cough: Secondary | ICD-10-CM | POA: Diagnosis not present

## 2022-10-23 DIAGNOSIS — U099 Post covid-19 condition, unspecified: Secondary | ICD-10-CM | POA: Diagnosis not present

## 2022-10-23 DIAGNOSIS — G43009 Migraine without aura, not intractable, without status migrainosus: Secondary | ICD-10-CM | POA: Diagnosis not present

## 2022-10-30 DIAGNOSIS — M65331 Trigger finger, right middle finger: Secondary | ICD-10-CM | POA: Diagnosis not present

## 2022-10-30 DIAGNOSIS — M79641 Pain in right hand: Secondary | ICD-10-CM | POA: Diagnosis not present

## 2022-11-17 ENCOUNTER — Ambulatory Visit: Payer: Medicare Other | Attending: Cardiology | Admitting: Cardiology

## 2022-11-17 ENCOUNTER — Encounter: Payer: Self-pay | Admitting: Cardiology

## 2022-11-17 VITALS — BP 110/68 | HR 62 | Ht 66.0 in | Wt 209.4 lb

## 2022-11-17 DIAGNOSIS — R5383 Other fatigue: Secondary | ICD-10-CM

## 2022-11-17 DIAGNOSIS — I10 Essential (primary) hypertension: Secondary | ICD-10-CM

## 2022-11-17 DIAGNOSIS — I2510 Atherosclerotic heart disease of native coronary artery without angina pectoris: Secondary | ICD-10-CM | POA: Diagnosis not present

## 2022-11-17 DIAGNOSIS — R400 Somnolence: Secondary | ICD-10-CM

## 2022-11-17 NOTE — Progress Notes (Unsigned)
Cardiology Office Note:    Date:  11/18/2022   ID:  Anna [name], DOB 23-Feb-1945, MRN [id number]  PCP:  Merri Brunette, MD  Cardiologist:  Thomasene Ripple, DO  Electrophysiologist:  None   Referring MD: Merri Brunette, MD   " I am ok"   History of Present Illness:    Anna [name] is a 77 y.o. female with a hx of CAD seen on [facility], hyperlipidemia , here for follow up.   She reports excessive daytime sleepiness. She reports falling asleep at different places, which is a new symptom for her. She denies frequent snoring but admits to feeling tired at times. She has a history of COVID-19 infection, and since then, she has been feeling more tired.   Past Medical History:  Diagnosis Date   Arthritis    Breast cancer [facility])    age 56   Family history of adverse reaction to anesthesia    Son takes longer to wake   Headache    Hyperlipidemia    Osteopenia    Vision abnormalities     Past Surgical History:  Procedure Laterality Date   BACK SURGERY     lumbar region   BUNIONECTOMY Bilateral    MASTECTOMY Left    at age 53   TOTAL HIP ARTHROPLASTY Right 04/22/2021   Procedure: Right TOTAL HIP ARTHROPLASTY ANTERIOR APPROACH;  Surgeon: Kathryne Hitch, MD;  Location: [facility] OR;  Service: Orthopedics;  Laterality: Right;   TRIGGER FINGER RELEASE Left    left thumb   TUBAL LIGATION      Current Medications: Current Meds  Medication Sig   Acetaminophen 500 MG capsule Take 500-1,000 mg by mouth daily. Take 1000 mg in the morning and 500 mg at bedtime   ASPERCREME LIDOCAINE EX Apply 1 application. topically daily as needed (Pain).   aspirin EC 81 MG tablet Take 1 tablet (81 mg total) by mouth daily. Swallow whole.   Calcium 600-200 MG-UNIT tablet Take 2 tablets by mouth daily.   Cholecalciferol (VITAMIN D-3) 125 MCG (5000 UT) TABS Take 5,000 Units by mouth daily.   famotidine (PEPCID) 40 MG tablet Take 40 mg by mouth at bedtime.   levothyroxine (SYNTHROID) 100 MCG tablet Take 100  mcg by mouth daily.   lidocaine (LIDODERM) 5 % Place 1 patch onto the skin daily. Remove & Discard patch within 12 hours or as directed by MD   LINZESS 145 MCG CAPS capsule Take 145 mcg by mouth every morning.   omeprazole (PRILOSEC) 20 MG capsule Take 1 capsule (20 mg total) by mouth daily.   OVER THE COUNTER MEDICATION Place 1 drop into both eyes daily as needed (Dry eye). Dry eye formula   rosuvastatin (CRESTOR) 5 MG tablet Take 1 tablet by mouth once daily   SUMAtriptan (IMITREX) 25 MG tablet Take 1 tab at onset of migraine.  May repeat in 2 hrs, if needed.  Max dose: 2 tabs/day. This is a 30 day prescription.     Allergies:   Septra [sulfamethoxazole-trimethoprim] and Sulfa antibiotics   Social History   Socioeconomic History   Marital status: Widowed    Spouse name: Not on file   Number of children: 2   Years of education: Not on file   Highest education level: Not on file  Occupational History   Not on file  Tobacco Use   Smoking status: Never   Smokeless tobacco: Never  Vaping Use   Vaping status: Never Used  Substance and Sexual Activity  Alcohol use: Never   Drug use: Never   Sexual activity: Not Currently  Other Topics Concern   Not on file  Social History Narrative   Not on file   Social Determinants of Health   Financial Resource Strain: Not on file  Food Insecurity: Not on file  Transportation Needs: Not on file  Physical Activity: Not on file  Stress: Not on file  Social Connections: Not on file     Family History: The patient's family history includes Alzheimer's disease in her father; Hyperlipidemia in her mother; Hypertension in her brother, brother, mother, sister, sister, sister, and sister; Lung cancer in her brother. There is no history of Breast cancer.  ROS:   Review of Systems  Constitution: Negative for decreased appetite, fever and weight gain.  HENT: Negative for congestion, ear discharge, hoarse voice and sore throat.   Eyes: Negative  for discharge, redness, vision loss in right eye and visual halos.  Cardiovascular: Negative for chest pain, dyspnea on exertion, leg swelling, orthopnea and palpitations.  Respiratory: Negative for cough, hemoptysis, shortness of breath and snoring.   Endocrine: Negative for heat intolerance and polyphagia.  Hematologic/Lymphatic: Negative for bleeding problem. Does not bruise/bleed easily.  Skin: Negative for flushing, nail changes, rash and suspicious lesions.  Musculoskeletal: Negative for arthritis, joint pain, muscle cramps, myalgias, neck pain and stiffness.  Gastrointestinal: Negative for abdominal pain, bowel incontinence, diarrhea and excessive appetite.  Genitourinary: Negative for decreased libido, genital sores and incomplete emptying.  Neurological: Negative for brief paralysis, focal weakness, headaches and loss of balance.  Psychiatric/Behavioral: Negative for altered mental status, depression and suicidal ideas.  Allergic/Immunologic: Negative for HIV exposure and persistent infections.    EKGs/Labs/Other Studies Reviewed:    The following studies were reviewed today:   EKG:  The ekg ordered today demonstrates sinus rhythm, 62 beats minute with nonspecific ST abnormalities.   Transthoracic echocardiogram August 2022 IMPRESSIONS   1. Left ventricular ejection fraction, by estimation, is 60 to 65%. The left ventricle has normal function. The left ventricle has no regional wall motion abnormalities. There is mild left ventricular hypertrophy.  Left ventricular diastolic parameters  are consistent with Grade I diastolic dysfunction (impaired relaxation).   2. Right ventricular systolic function is normal. The right ventricular  size is normal. There is normal pulmonary artery systolic pressure. The  estimated right ventricular systolic pressure is 29.8 mmHg.   3. Left atrial size was mildly dilated.   4. The mitral valve is normal in structure. Trivial mitral valve   regurgitation. No evidence of mitral stenosis.   5. The aortic valve is tricuspid. Aortic valve regurgitation is not  visualized. No aortic stenosis is present.   6. The inferior vena cava is normal in size with greater than 50%  respiratory variability, suggesting right atrial pressure of 3 mmHg.   FINDINGS   Left Ventricle: Left ventricular ejection fraction, by estimation, is 60  to 65%. The left ventricle has normal function. The left ventricle has no  regional wall motion abnormalities. The left ventricular internal cavity  size was normal in size. There is   mild left ventricular hypertrophy. Left ventricular diastolic parameters  are consistent with Grade I diastolic dysfunction (impaired relaxation).   Right Ventricle: The right ventricular size is normal. No increase in  right ventricular wall thickness. Right ventricular systolic function is  normal. There is normal pulmonary artery systolic pressure. The tricuspid  regurgitant velocity is 2.59 m/s, and   with an assumed right atrial  pressure of 3 mmHg, the estimated right  ventricular systolic pressure is 29.8 mmHg.   Left Atrium: Left atrial size was mildly dilated.   Right Atrium: Right atrial size was normal in size.   Pericardium: Trivial pericardial effusion is present.   Mitral Valve: The mitral valve is normal in structure. Trivial mitral  valve regurgitation. No evidence of mitral valve stenosis.   Tricuspid Valve: The tricuspid valve is normal in structure. Tricuspid  valve regurgitation is mild . No evidence of tricuspid stenosis.   Aortic Valve: The aortic valve is tricuspid. Aortic valve regurgitation is  not visualized. No aortic stenosis is present.   Pulmonic Valve: The pulmonic valve was normal in structure. Pulmonic valve  regurgitation is trivial. No evidence of pulmonic stenosis.   Aorta: The aortic root is normal in size and structure.   Venous: The inferior vena cava is normal in size with  greater than 50%  respiratory variability, suggesting right atrial pressure of 3 mmHg.     Nuclear stress test Nuclear stress EF: 57%. The left ventricular ejection fraction is normal (55-65%). This is a low risk study. There is no evidence of ischemia and no evidence of previous infarction. The study is normal. Recent Labs: No results found for requested labs within last 365 days.  Recent Lipid Panel    Component Value Date/Time   CHOL 145 05/08/2022 0936   TRIG 96 05/08/2022 0936   HDL 58 05/08/2022 0936   CHOLHDL 2.5 05/08/2022 0936   LDLCALC 69 05/08/2022 0936    Physical Exam:    VS:  BP 110/68 (BP Location: Right Arm, Patient Position: Sitting, Cuff Size: Normal)   Pulse 62   Ht 5\' 6"  (1.676 m)   Wt 209 lb 6.4 oz (95 kg)   SpO2 98%   BMI 33.80 kg/m     Wt Readings from Last 3 Encounters:  11/17/22 209 lb 6.4 oz (95 kg)  05/08/22 195 lb 3.2 oz (88.5 kg)  08/08/21 193 lb (87.5 kg)     GEN: Well nourished, well developed in no acute distress HEENT: Normal NECK: No JVD; No carotid bruits LYMPHATICS: No lymphadenopathy CARDIAC: S1S2 noted,RRR, no murmurs, rubs, gallops RESPIRATORY:  Clear to auscultation without rales, wheezing or rhonchi  ABDOMEN: Soft, non-tender, non-distended, +bowel sounds, no guarding. EXTREMITIES: No edema, No cyanosis, no clubbing MUSCULOSKELETAL:  No deformity  SKIN: Warm and dry NEUROLOGIC:  Alert and oriented x 3, non-focal PSYCHIATRIC:  Normal affect, good insight  ASSESSMENT:    1. Primary hypertension   2. Daytime somnolence   3. Other fatigue   4. CAD in native artery   5. Morbid obesity (HCC)    PLAN:    Possible Sleep Apnea Reports excessive daytime sleepiness and possible snoring. No prior testing for sleep apnea. Discussed the potential cardiac implications of untreated sleep apnea, particularly on the right side of the heart. -Order home sleep study to evaluate for sleep apnea. StopBang score is 5.  Coronary Artery  Disease and Hypertension No recent hospitalizations or urgent care visits. No mention of changes to current medication regimen. -Continue current management.  Vitamin D Supplementation Reports taking vitamin D supplement daily. No recent vitamin D level checked. -Check vitamin D level at next physical.  Lower Extremity Edema Reports ongoing swelling. No further assessment or plan discussed in the conversation.  Follow-up in 1 year or sooner if any issues arise.  The patient understands the need to lose weight with diet and exercise. We have discussed  specific strategies for this.   The patient is in agreement with the above plan. The patient left the office in stable condition.  The patient will follow up in   Medication Adjustments/Labs and Tests Ordered: Current medicines are reviewed at length with the patient today.  Concerns regarding medicines are outlined above.  Orders Placed This Encounter  Procedures   EKG 12-Lead   Itamar Sleep Study   No orders of the defined types were placed in this encounter.   Patient Instructions  Medication Instructions:  Your physician recommends that you continue on your current medications as directed. Please refer to the Current Medication list given to you today.  *If you need a refill on your cardiac medications before your next appointment, please call your pharmacy*   Lab Work: None   Testing/Procedures: Itamar sleep study.     Follow-Up: At [facility], you and your health needs are our priority.  As part of our continuing mission to provide you with exceptional heart care, we have created designated Provider Care Teams.  These Care Teams include your primary Cardiologist (physician) and Advanced Practice Providers (APPs -  Physician Assistants and Nurse Practitioners) who all work together to provide you with the care you need, when you need it.  Your next appointment:   1 year(s)  Provider:   Thomasene Ripple, DO     Adopting a Healthy Lifestyle.  Know what a healthy weight is for you (roughly BMI <25) and aim to maintain this   Aim for 7+ servings of fruits and vegetables daily   65-80+ fluid ounces of water or unsweet tea for healthy kidneys   Limit to max 1 drink of alcohol per day; avoid smoking/tobacco   Limit animal fats in diet for cholesterol and heart health - choose grass fed whenever available   Avoid highly processed foods, and foods high in saturated/trans fats   Aim for low stress - take time to unwind and care for your mental health   Aim for 150 min of moderate intensity exercise weekly for heart health, and weights twice weekly for bone health   Aim for 7-9 hours of sleep daily   When it comes to diets, agreement about the perfect plan isnt easy to find, even among the experts. Experts at the [facility] of [employer] developed an idea known as the Healthy Eating Plate. Just imagine a plate divided into logical, healthy portions.   The emphasis is on diet quality:   Load up on vegetables and fruits - one-half of your plate: Aim for color and variety, and remember that potatoes dont count.   Go for whole grains - one-quarter of your plate: Whole wheat, barley, wheat berries, quinoa, oats, brown rice, and foods made with them. If you want pasta, go with whole wheat pasta.   Protein power - one-quarter of your plate: Fish, chicken, beans, and nuts are all healthy, versatile protein sources. Limit red meat.   The diet, however, does go beyond the plate, offering a few other suggestions.   Use healthy plant oils, such as olive, canola, soy, corn, sunflower and peanut. Check the labels, and avoid partially hydrogenated oil, which have unhealthy trans fats.   If youre thirsty, drink water. Coffee and tea are good in moderation, but skip sugary drinks and limit milk and dairy products to one or two daily servings.   The type of carbohydrate in the diet is more important  than the amount. Some sources of carbohydrates,  such as vegetables, fruits, whole grains, and beans-are healthier than others.   Finally, stay active  Signed, Thomasene Ripple, DO  11/18/2022 10:33 PM    [facility]

## 2022-11-17 NOTE — Progress Notes (Unsigned)
Patient agreement reviewed and signed on 11/17/2022.  WatchPAT issued to patient on 11/17/2022 by Brunetta Genera, CMA. Patient aware to not open the WatchPAT box until contacted with the activation PIN. Patient profile initialized in CloudPAT on 11/17/2022 by Brunetta Genera, CMA. Device serial number: [id number]

## 2022-11-17 NOTE — Patient Instructions (Signed)
Medication Instructions:  Your physician recommends that you continue on your current medications as directed. Please refer to the Current Medication list given to you today.  *If you need a refill on your cardiac medications before your next appointment, please call your pharmacy*   Lab Work: None   Testing/Procedures: Itamar sleep study.     Follow-Up: At [facility], you and your health needs are our priority.  As part of our continuing mission to provide you with exceptional heart care, we have created designated Provider Care Teams.  These Care Teams include your primary Cardiologist (physician) and Advanced Practice Providers (APPs -  Physician Assistants and Nurse Practitioners) who all work together to provide you with the care you need, when you need it.  Your next appointment:   1 year(s)  Provider:   Thomasene Ripple, DO

## 2022-11-23 DIAGNOSIS — Z0000 Encounter for general adult medical examination without abnormal findings: Secondary | ICD-10-CM | POA: Diagnosis not present

## 2022-11-23 DIAGNOSIS — E559 Vitamin D deficiency, unspecified: Secondary | ICD-10-CM | POA: Diagnosis not present

## 2022-11-30 DIAGNOSIS — D509 Iron deficiency anemia, unspecified: Secondary | ICD-10-CM | POA: Diagnosis not present

## 2022-11-30 DIAGNOSIS — E7800 Pure hypercholesterolemia, unspecified: Secondary | ICD-10-CM | POA: Diagnosis not present

## 2022-11-30 DIAGNOSIS — I2510 Atherosclerotic heart disease of native coronary artery without angina pectoris: Secondary | ICD-10-CM | POA: Diagnosis not present

## 2022-11-30 DIAGNOSIS — K219 Gastro-esophageal reflux disease without esophagitis: Secondary | ICD-10-CM | POA: Diagnosis not present

## 2022-11-30 DIAGNOSIS — Z0000 Encounter for general adult medical examination without abnormal findings: Secondary | ICD-10-CM | POA: Diagnosis not present

## 2022-11-30 DIAGNOSIS — E559 Vitamin D deficiency, unspecified: Secondary | ICD-10-CM | POA: Diagnosis not present

## 2022-11-30 DIAGNOSIS — E039 Hypothyroidism, unspecified: Secondary | ICD-10-CM | POA: Diagnosis not present

## 2022-12-01 ENCOUNTER — Telehealth: Payer: Self-pay | Admitting: Cardiology

## 2022-12-01 NOTE — Telephone Encounter (Signed)
Patient called to follow-up on getting sleep equipment and if it has been approved by her insurance company.

## 2022-12-03 ENCOUNTER — Telehealth: Payer: Self-pay

## 2022-12-03 NOTE — Telephone Encounter (Signed)
PER LYNN: Patient notified of PIN (1234) on 12/03/2022 via Notification Method: phone.

## 2022-12-03 NOTE — Telephone Encounter (Signed)
**Note De-Identified Anna [name] Obfuscation** Ordering provider: Dr Servando Salina Associated diagnoses: Somnolence-R40.0, Fatigue-R53.83, Obesity-E66.01 and CAD-I25.10 WatchPAT PA obtained on 12/03/2022 by Anna [name], Anna Formosa, LPN. Authorization: Per the [facility] Provider Portal: Notification or Prior Authorization is not required for the requested services You are not required to submit a notification/prior authorization based on the information provided. Decision ID #: [id number] Patient notified of PIN (1234) on 12/03/2022 Anna [name] Notification Method: phone.  Phone note routed to covering staff for follow-up.

## 2022-12-04 ENCOUNTER — Encounter (INDEPENDENT_AMBULATORY_CARE_PROVIDER_SITE_OTHER): Payer: Medicare Other | Admitting: Cardiology

## 2022-12-04 DIAGNOSIS — G4710 Hypersomnia, unspecified: Secondary | ICD-10-CM

## 2022-12-04 DIAGNOSIS — G4719 Other hypersomnia: Secondary | ICD-10-CM

## 2022-12-04 DIAGNOSIS — Z23 Encounter for immunization: Secondary | ICD-10-CM | POA: Diagnosis not present

## 2022-12-06 ENCOUNTER — Ambulatory Visit: Payer: Medicare Other | Attending: Cardiology

## 2022-12-06 DIAGNOSIS — R400 Somnolence: Secondary | ICD-10-CM

## 2022-12-06 NOTE — Procedures (Signed)
   SLEEP STUDY REPORT Patient Information Study Date: 12/04/2022 Patient Name: Anna [name] Patient ID: [id number] Birth Date: [birth year] Age: 77 Gender: Female BMI: 33.7 (W=209 lb, H=5' 6'') Referring Physician: Thomasene Ripple, DO  TEST DESCRIPTION: Home sleep apnea testing was completed using the WatchPat, a Type 1 device, utilizing peripheral arterial tonometry (PAT), chest movement, actigraphy, pulse oximetry, pulse rate, body position and snore. AHI was calculated with apnea and hypopnea using valid sleep time as the denominator. RDI includes apneas, hypopneas, and RERAs. The data acquired and the scoring of sleep and all associated events were performed in accordance with the recommended standards and specifications as outlined in the AASM Manual for the Scoring of Sleep and Associated Events 2.2.0 (2015).  FINDINGS: 1. No evidence of Obstructive Sleep Apnea with AHI 4.7/hr. 2. No Central Sleep Apnea. 3. Oxygen desaturations as low as 88%. 4. Mild snoring was present. O2 sats were < 88% for 0 minutes. 5. Total sleep time was 7 hrs and 48 min. 6. 27.5% of total sleep time was spent in REM sleep. 7. Normal sleep onset latency at 26 min. 8. Shortened REM sleep onset latency at 33 min. 9. Total awakenings were 1.  DIAGNOSIS: Normal study with no significant sleep disordered breathing.  RECOMMENDATIONS: 1. Normal study with no significant sleep disordered breathing.  2. Healthy sleep recommendations include: adequate nightly sleep (normal 7-9 hrs/night), avoidance of caffeine after noon and alcohol near bedtime, and maintaining a sleep environment that is cool, dark and quiet.  3. Weight loss for overweight patients is recommended.  4. Snoring recommendations include: weight loss where appropriate, side sleeping, and avoidance of alcohol before bed.  5. Operation of motor vehicle or dangerous equipment must be avoided when feeling drowsy, excessively sleepy, or mentally  fatigued.  6. An ENT consultation which may be useful for specific causes of and possible treatment of bothersome snoring .  7. Weight loss may be of benefit in reducing the severity of snoring.   Signature: Armanda Magic, MD; [facility]; Diplomat, American Board of Sleep Medicine Electronically Signed: 12/06/2022 5:30:33 PM

## 2022-12-07 ENCOUNTER — Telehealth: Payer: Self-pay | Admitting: *Deleted

## 2022-12-07 NOTE — Telephone Encounter (Signed)
-----   Message from Armanda Magic sent at 12/06/2022  5:31 PM EST ----- Please let patient know that sleep study showed no significant sleep apnea.

## 2022-12-07 NOTE — Telephone Encounter (Signed)
The patient has been notified of the result and verbalized understanding.  All questions (if any) were answered. Anna [name], CMA 12/07/2022 2:07 PM    Pt is aware and agreeable to normal results.

## 2022-12-09 IMAGING — CR DG CHEST 2V
2 series · 2 of 2 positions shown · non-contrast
Comparison: 08/07/2020.

CLINICAL DATA: Chest pain and pressure radiating to the back.

EXAM:
CHEST - 2 VIEW

[chest pa]
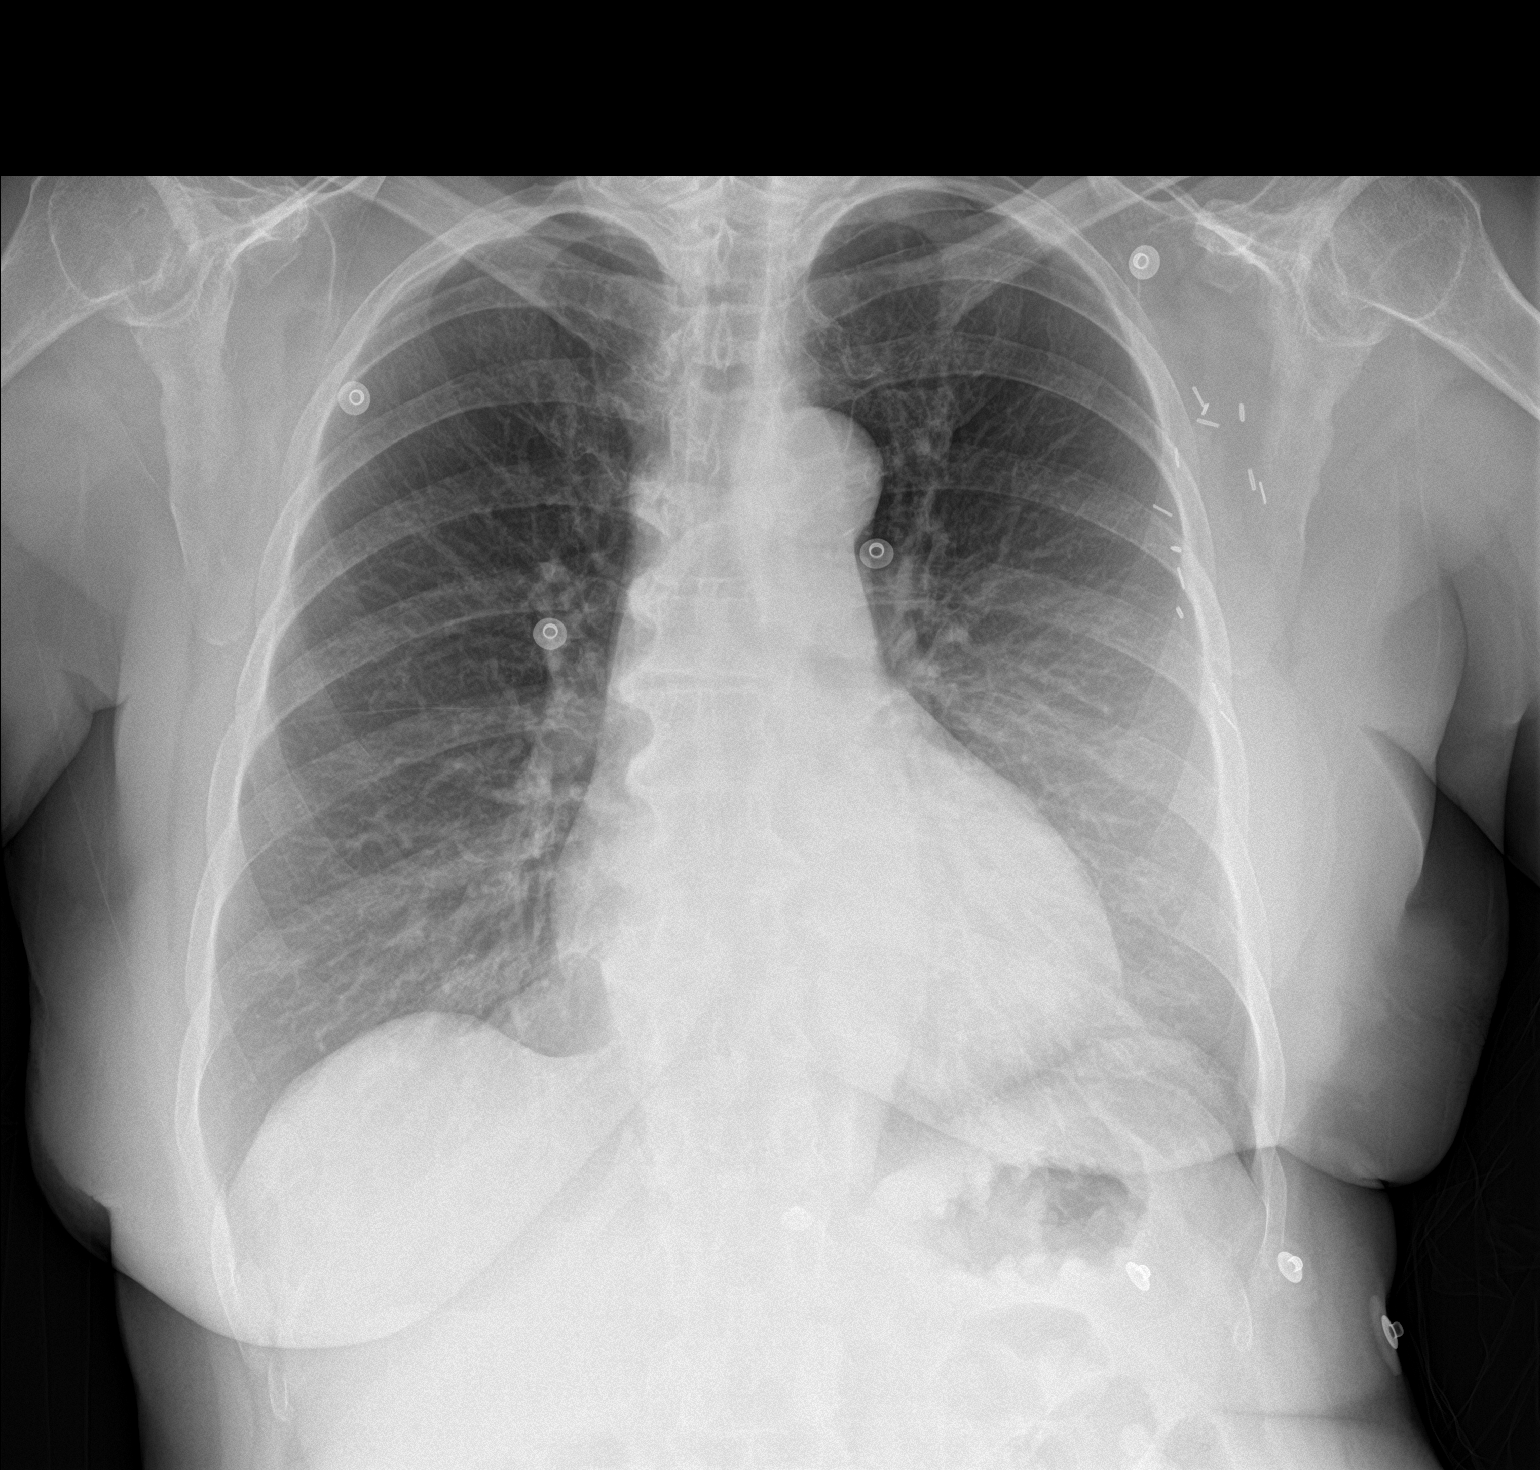

[chest lat]
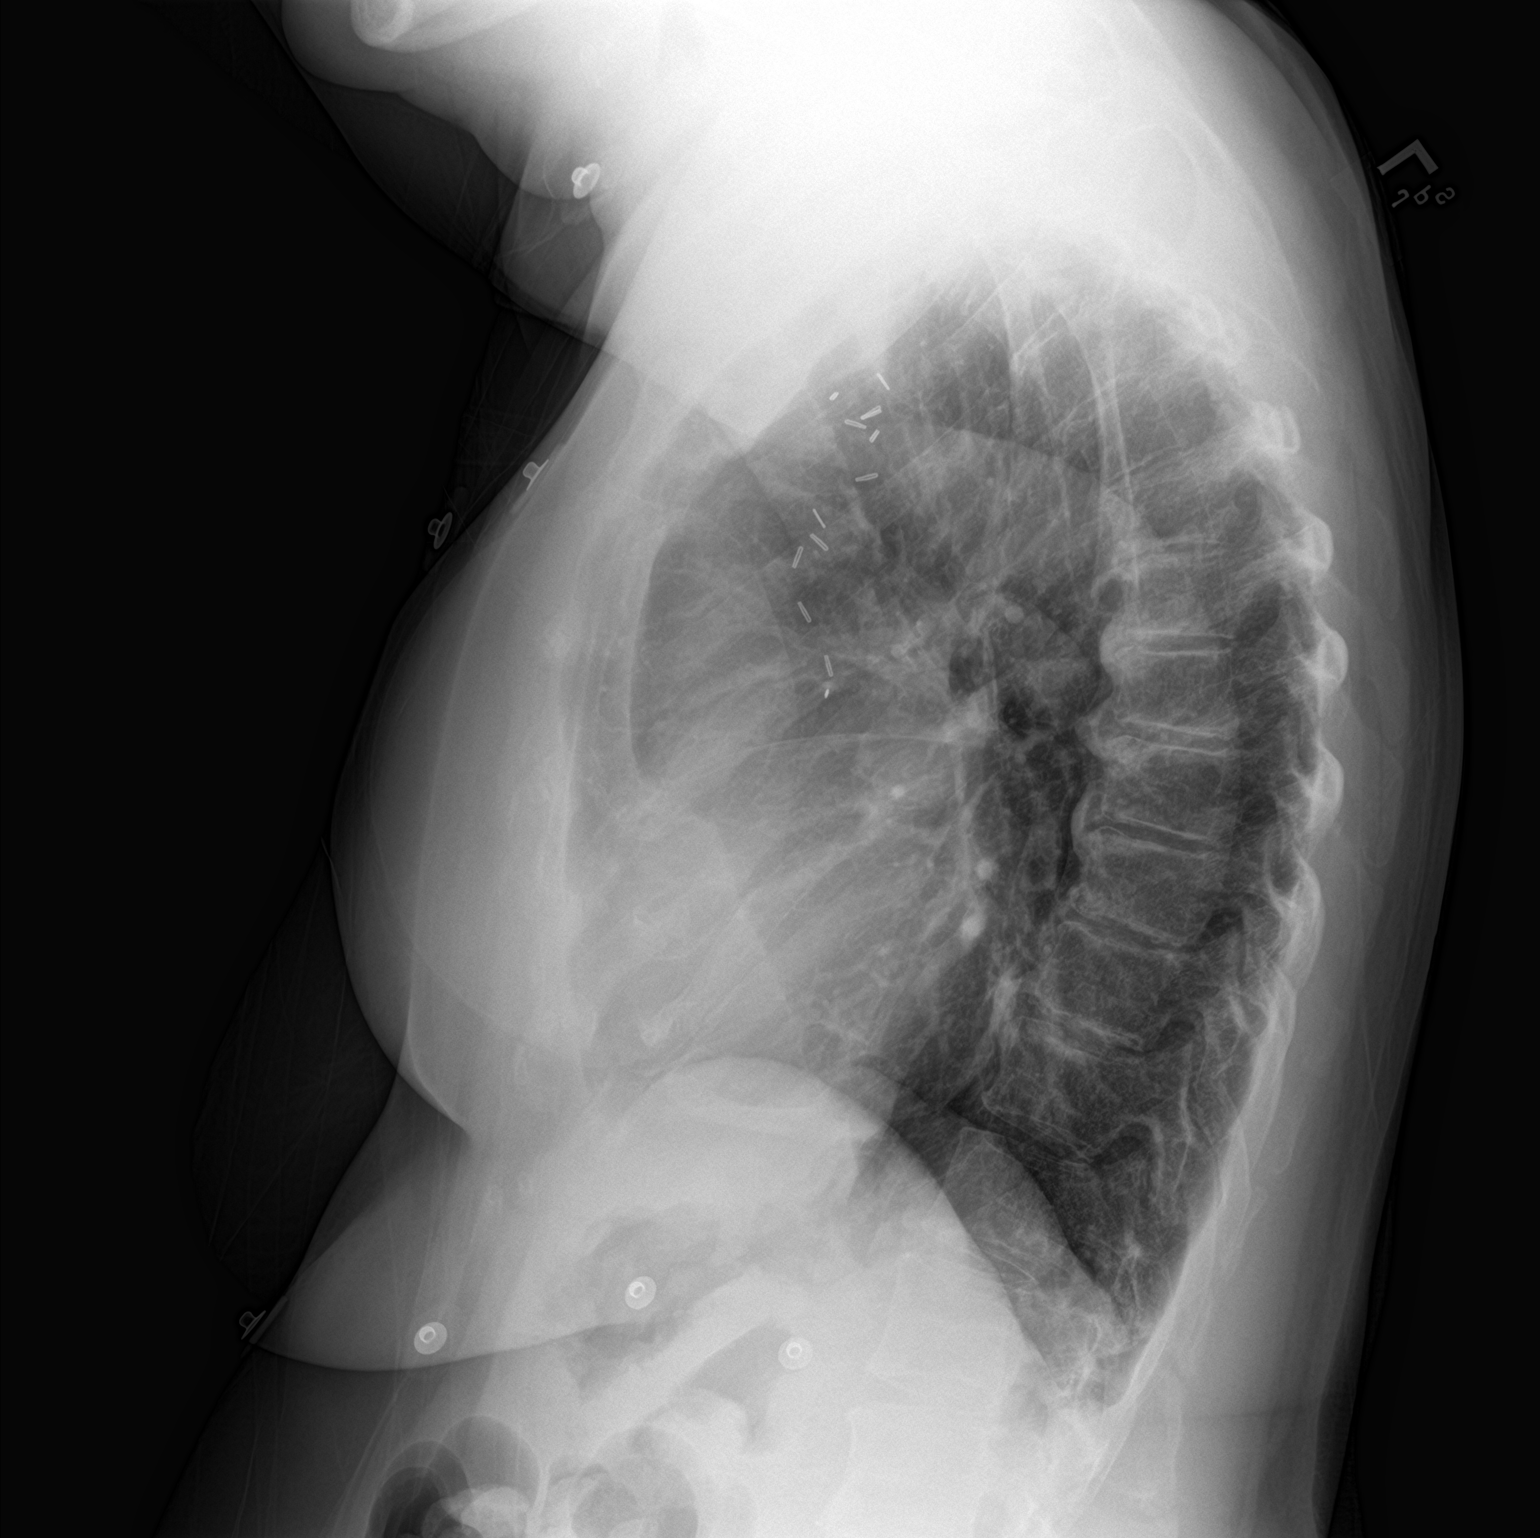

[2 of 2 positions shown; findings below may reference images not displayed]

FINDINGS: Trachea is midline. Heart is at the upper limits of normal in size,
stable. Lungs are clear. No pleural fluid. Surgical clips in left
axilla. Left mastectomy. Degenerative changes in the spine.
IMPRESSION: No acute findings.

## 2022-12-28 NOTE — Telephone Encounter (Signed)
Called pt. To verify. She states she has already completed the test and received he results.

## 2023-01-04 ENCOUNTER — Other Ambulatory Visit: Payer: Self-pay | Admitting: Internal Medicine

## 2023-01-04 DIAGNOSIS — Z1231 Encounter for screening mammogram for malignant neoplasm of breast: Secondary | ICD-10-CM

## 2023-01-08 DIAGNOSIS — Z23 Encounter for immunization: Secondary | ICD-10-CM | POA: Diagnosis not present

## 2023-01-11 DIAGNOSIS — J320 Chronic maxillary sinusitis: Secondary | ICD-10-CM | POA: Diagnosis not present

## 2023-01-14 DIAGNOSIS — M5412 Radiculopathy, cervical region: Secondary | ICD-10-CM | POA: Diagnosis not present

## 2023-01-20 ENCOUNTER — Ambulatory Visit (INDEPENDENT_AMBULATORY_CARE_PROVIDER_SITE_OTHER): Payer: Medicare Other

## 2023-01-20 ENCOUNTER — Ambulatory Visit: Payer: Medicare Other | Admitting: Podiatry

## 2023-01-20 ENCOUNTER — Encounter: Payer: Self-pay | Admitting: Podiatry

## 2023-01-20 VITALS — Ht 66.0 in | Wt 209.0 lb

## 2023-01-20 DIAGNOSIS — M7751 Other enthesopathy of right foot: Secondary | ICD-10-CM | POA: Diagnosis not present

## 2023-01-20 DIAGNOSIS — M21619 Bunion of unspecified foot: Secondary | ICD-10-CM

## 2023-01-20 DIAGNOSIS — M778 Other enthesopathies, not elsewhere classified: Secondary | ICD-10-CM | POA: Diagnosis not present

## 2023-01-20 MED ORDER — TRIAMCINOLONE ACETONIDE 10 MG/ML IJ SUSP
10.0000 mg | Freq: Once | INTRAMUSCULAR | Status: AC
  Administered 2023-01-20: 10 mg via INTRA_ARTICULAR

## 2023-01-20 NOTE — Progress Notes (Signed)
Subjective:   Patient ID: Anna [name], female   DOB: 77 y.o.   MRN: [id number]   HPI Patient presents with a lot of pain in the right big toe joint and foot and states that it feels like she has been walking differently and did have bunion surgery a number years ago.  Patient does not smoke and tries to be active   Review of Systems  All other systems reviewed and are negative.       Objective:  Physical Exam Vitals and nursing note reviewed.  Constitutional:      Appearance: She is well-developed.  Pulmonary:     Effort: Pulmonary effort is normal.  Musculoskeletal:        General: Normal range of motion.  Skin:    General: Skin is warm.  Neurological:     Mental Status: She is alert.     Neurovascular status intact muscle strength found to be adequate range of motion adequate with patient noted to have inflammation pain with fluid buildup around the first MPJ right and reduced motion and moderate generalized foot pain right.  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  I then discussed dorsal pain hopefully will get better if the big toe joint is better  X-rays indicate moderate narrowness of the first MPJ right history of surgery with bunion correction no other pathology

## 2023-02-04 ENCOUNTER — Other Ambulatory Visit: Payer: Self-pay

## 2023-02-04 MED ORDER — ROSUVASTATIN CALCIUM 5 MG PO TABS: 5.0000 mg | ORAL_TABLET | Freq: Every day | ORAL | 2 refills | Status: DC

## 2023-02-09 ENCOUNTER — Ambulatory Visit
Admission: RE | Admit: 2023-02-09 | Discharge: 2023-02-09 | Disposition: A | Discharge status: Home / Self Care | Payer: Medicare Other | Source: Ambulatory Visit | Attending: Internal Medicine | Admitting: Internal Medicine

## 2023-02-09 DIAGNOSIS — Z1231 Encounter for screening mammogram for malignant neoplasm of breast: Secondary | ICD-10-CM | POA: Diagnosis not present

## 2023-02-10 DIAGNOSIS — R0781 Pleurodynia: Secondary | ICD-10-CM | POA: Diagnosis not present

## 2023-02-11 DIAGNOSIS — M5412 Radiculopathy, cervical region: Secondary | ICD-10-CM | POA: Diagnosis not present

## 2023-03-09 DIAGNOSIS — M25511 Pain in right shoulder: Secondary | ICD-10-CM | POA: Diagnosis not present

## 2023-03-10 DIAGNOSIS — N644 Mastodynia: Secondary | ICD-10-CM | POA: Diagnosis not present

## 2023-03-15 DIAGNOSIS — I2510 Atherosclerotic heart disease of native coronary artery without angina pectoris: Secondary | ICD-10-CM | POA: Diagnosis not present

## 2023-03-15 DIAGNOSIS — G43009 Migraine without aura, not intractable, without status migrainosus: Secondary | ICD-10-CM | POA: Diagnosis not present

## 2023-03-15 DIAGNOSIS — E559 Vitamin D deficiency, unspecified: Secondary | ICD-10-CM | POA: Diagnosis not present

## 2023-03-15 DIAGNOSIS — R7303 Prediabetes: Secondary | ICD-10-CM | POA: Diagnosis not present

## 2023-03-15 DIAGNOSIS — D509 Iron deficiency anemia, unspecified: Secondary | ICD-10-CM | POA: Diagnosis not present

## 2023-03-27 IMAGING — CR DG CHEST 2V
2 series · 2 of 2 positions shown · non-contrast
Comparison: August 12, 2020.

CLINICAL DATA: Chest pain.

EXAM:
CHEST - 2 VIEW

[chest pa]
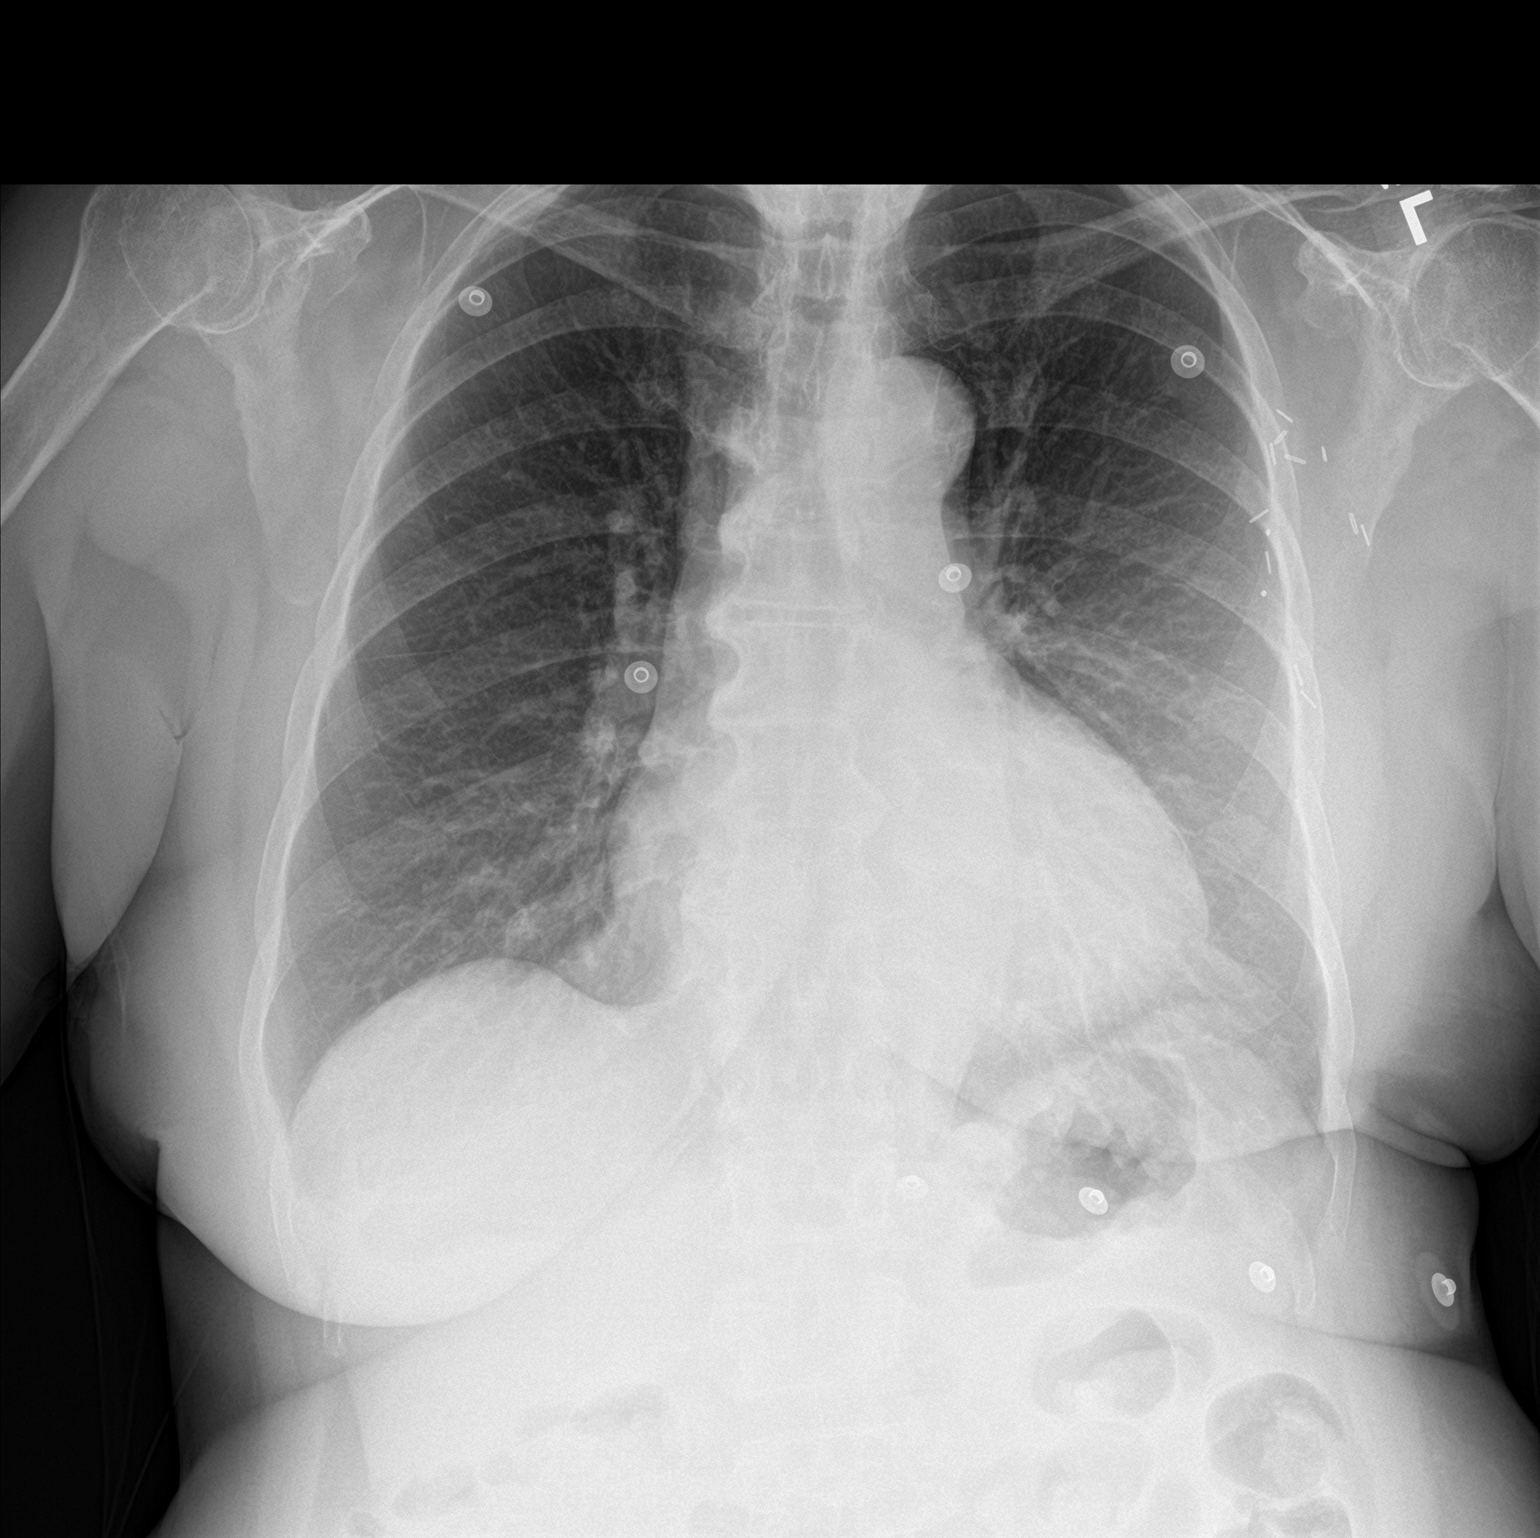

[chest lat]
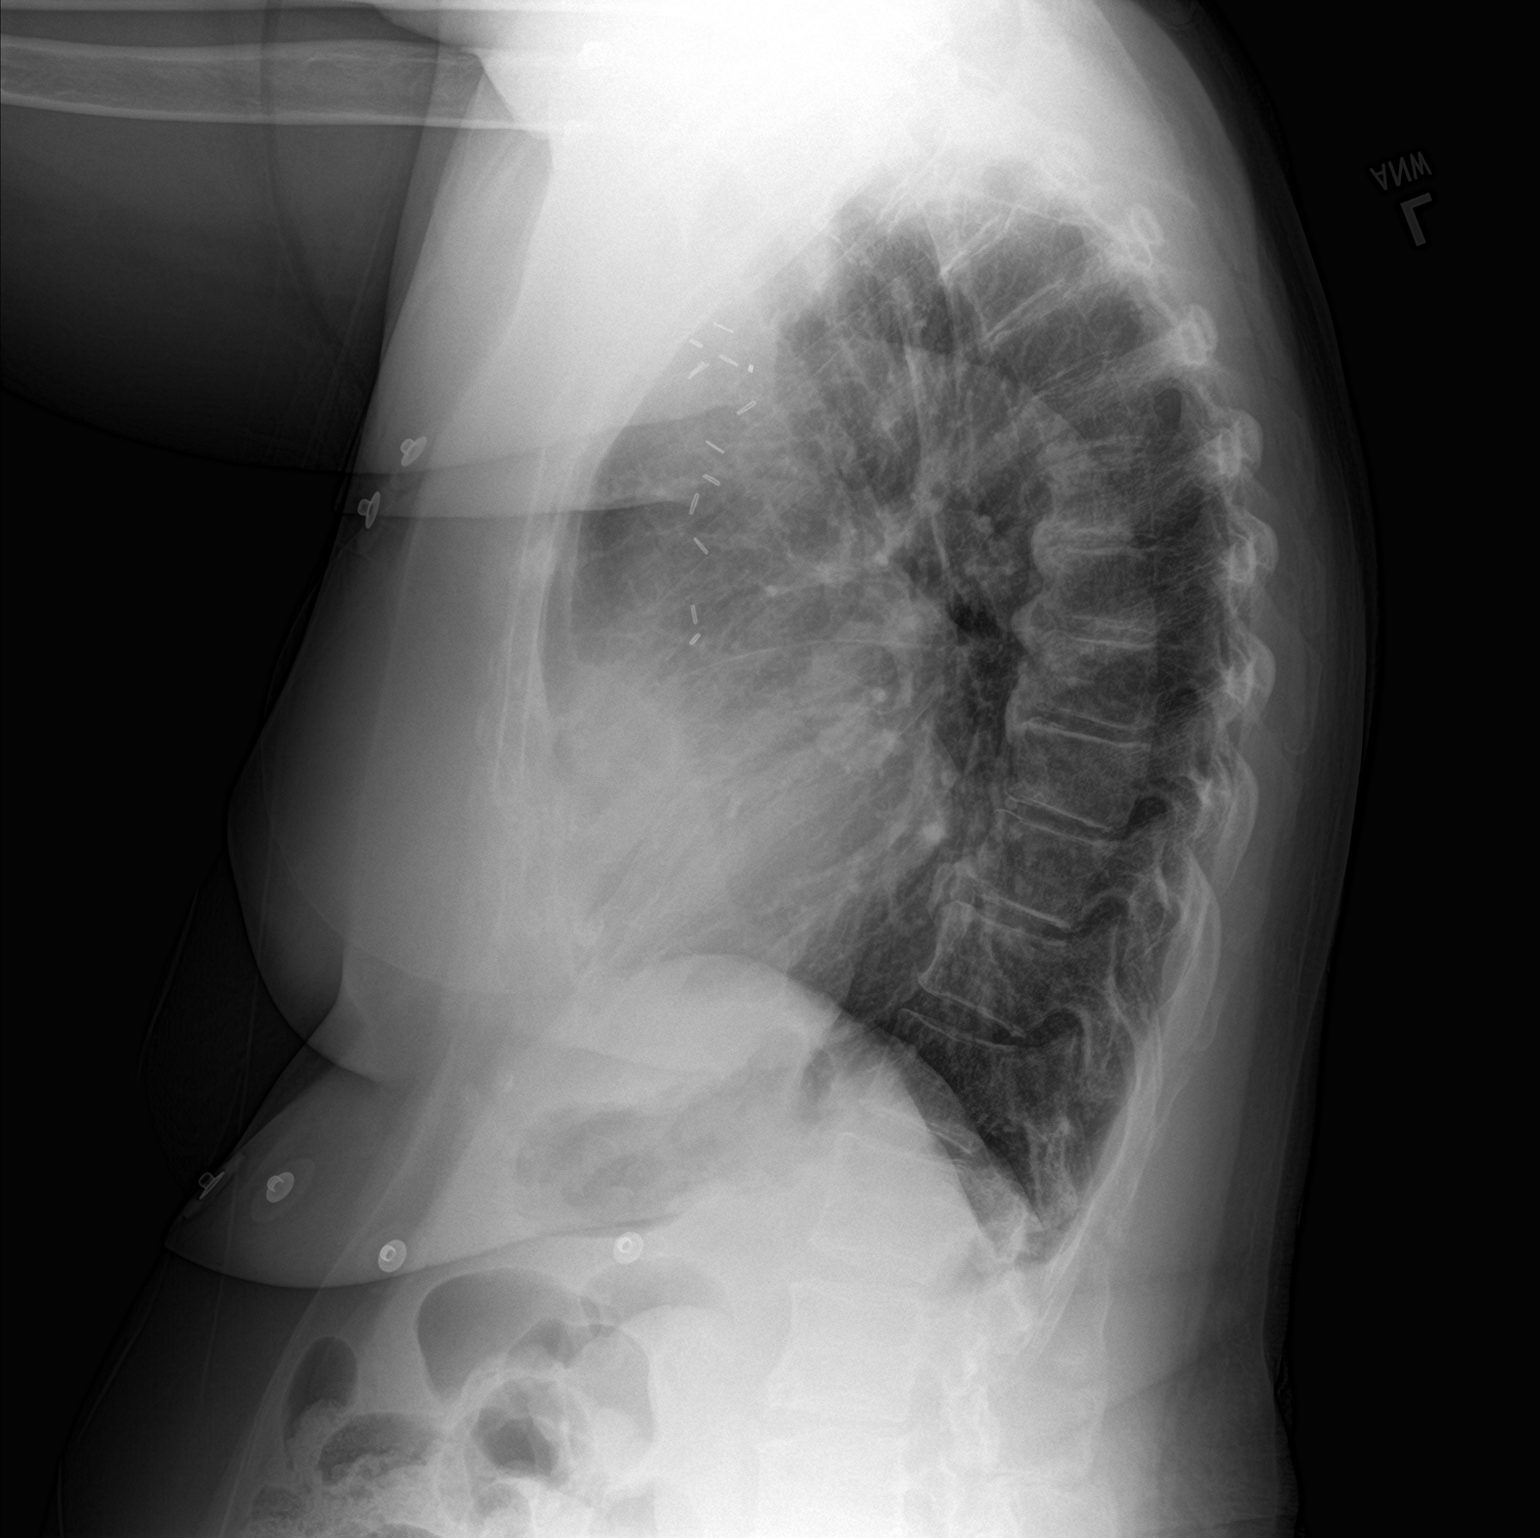

[2 of 2 positions shown; findings below may reference images not displayed]

FINDINGS: Stable cardiomediastinal silhouette. Both lungs are clear. The
visualized skeletal structures are unremarkable.
IMPRESSION: No active cardiopulmonary disease.

## 2023-03-27 IMAGING — CT CT ABD-PELV W/O CM
2 of 4 series · 17 of 46 positions shown, 19 images · non-contrast
Comparison: Ultrasound 09/05/2020

CLINICAL DATA: Epigastric pain central pressure

EXAM:
CT ABDOMEN AND PELVIS WITHOUT CONTRAST
TECHNIQUE: Multidetector CT imaging of the abdomen and pelvis was performed
following the standard protocol without IV contrast.

[Series 3: ap without · axial · non-contrast · 0.87mm/px · z∈[+719,+1104]mm · 14 of 87 slices shown, 16 images]
[im 5/87  soft-tissue]
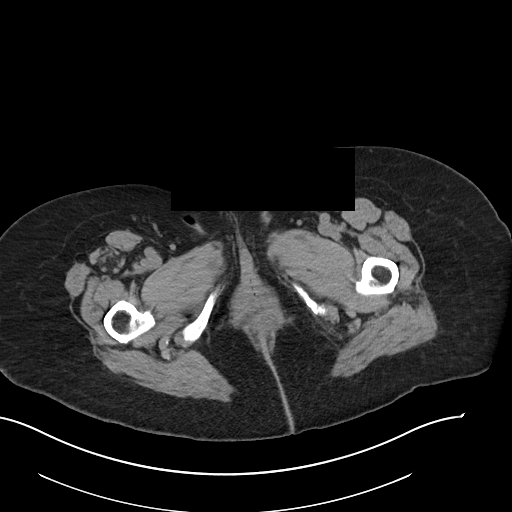
[im 5/87  bone]
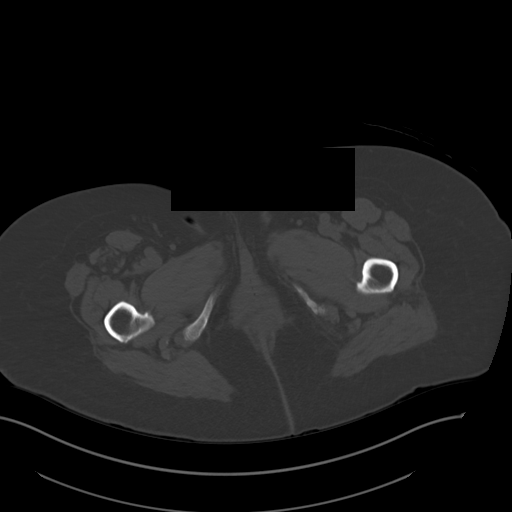
[im 10/87  soft-tissue]
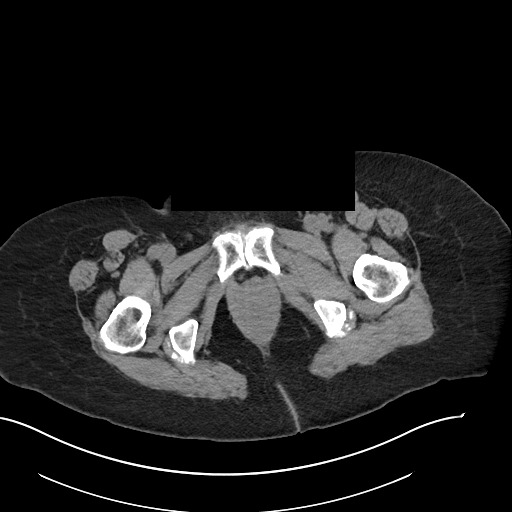
[im 20/87  soft-tissue]
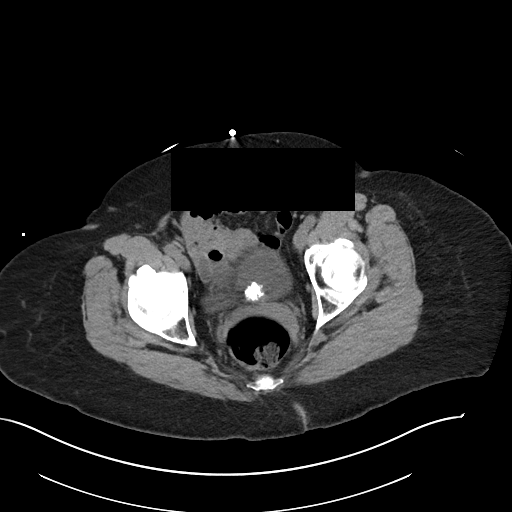
[im 24/87  soft-tissue]
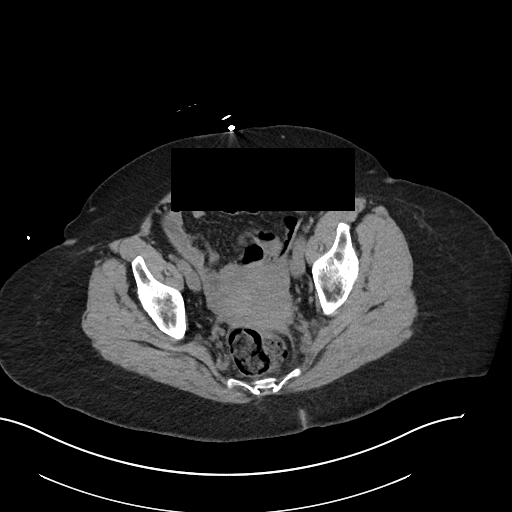
[im 29/87  soft-tissue]
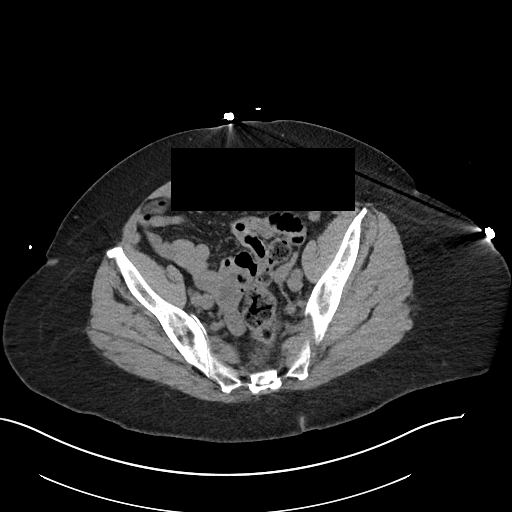
[im 34/87  soft-tissue]
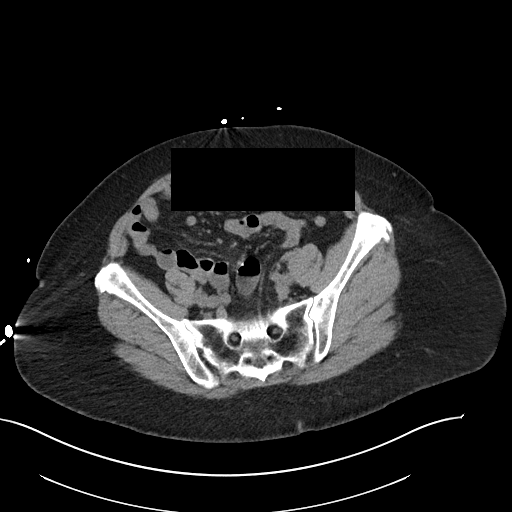
[im 39/87  soft-tissue]
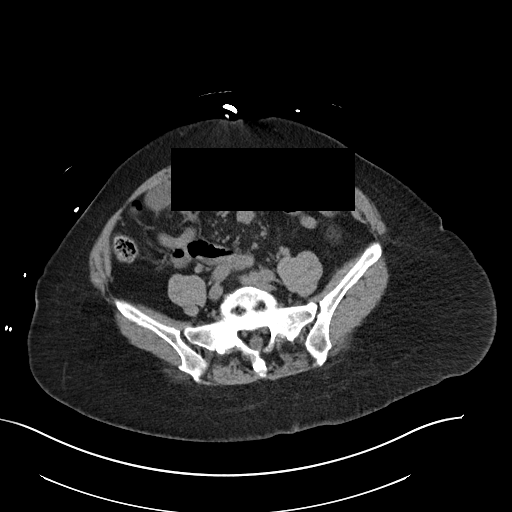
[im 48/87  soft-tissue]
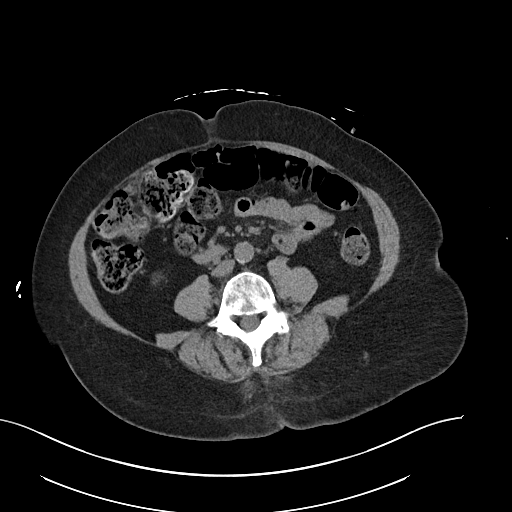
[im 53/87  soft-tissue]
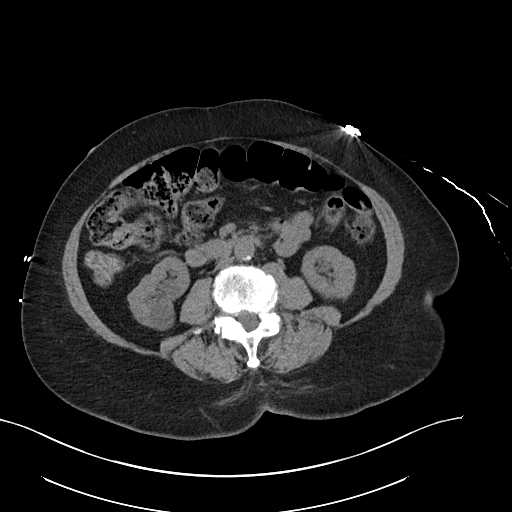
[im 53/87  bone]
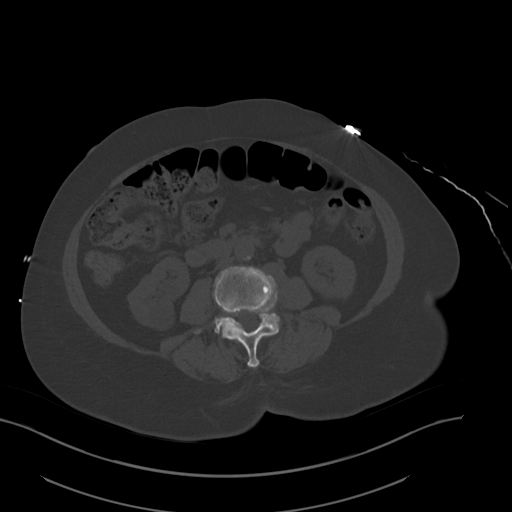
[im 58/87  soft-tissue]
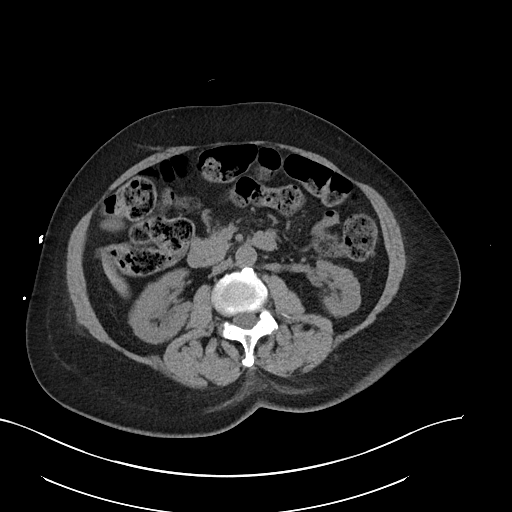
[im 63/87  soft-tissue]
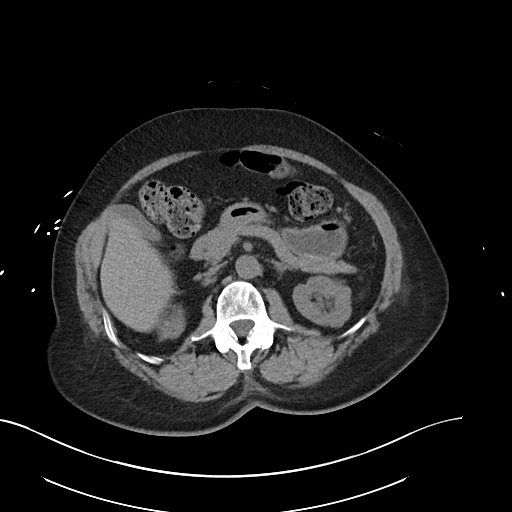
[im 67/87  soft-tissue]
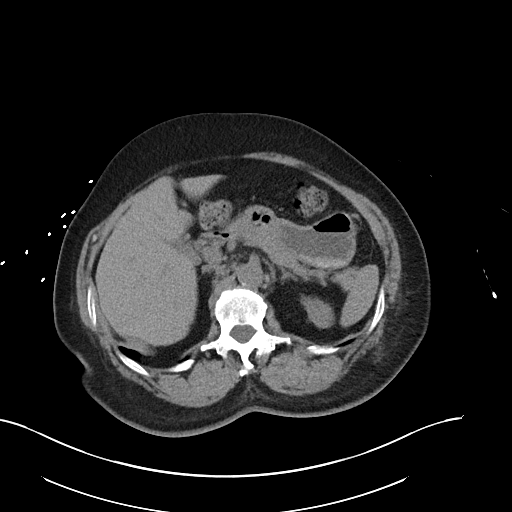
[im 77/87  soft-tissue]
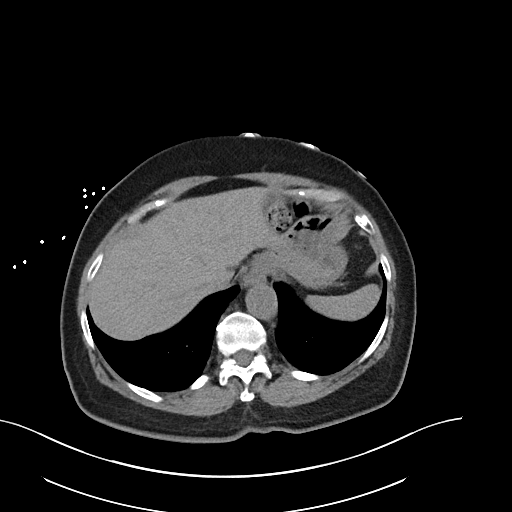
[im 82/87  soft-tissue]
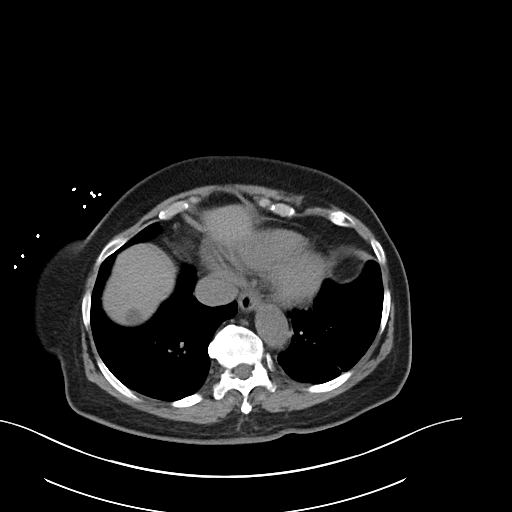

[Series 6: cor · coronal · 0.85mm/px · 3 of 105 slices shown]
[im 35/105  soft-tissue]
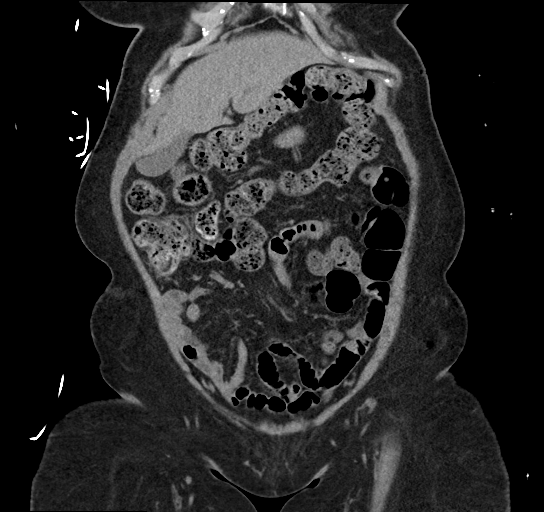
[im 47/105  soft-tissue]
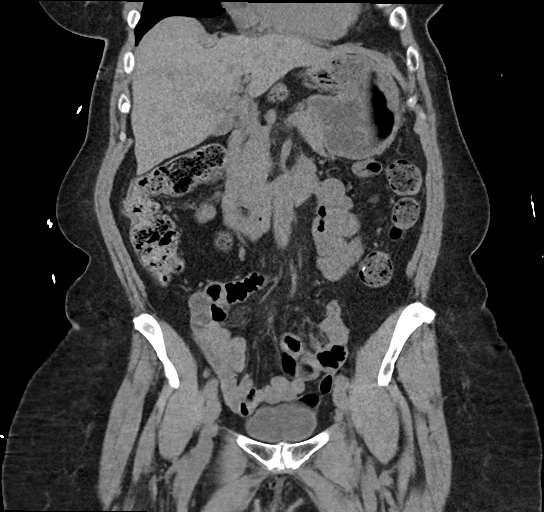
[im 58/105  soft-tissue]
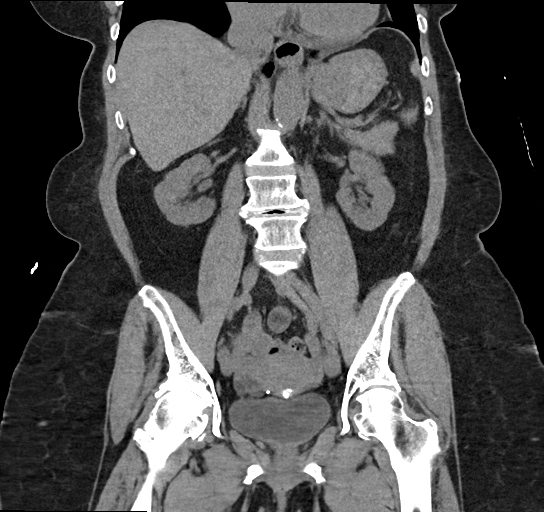

[17 of 46 positions shown; findings below may reference images not displayed]

FINDINGS: Lower chest: Lung bases demonstrate atelectasis or scarring at the
left base. No acute airspace disease. Borderline cardiomegaly. Small
hiatal hernia.

Hepatobiliary: No focal liver abnormality is seen. No gallstones,
gallbladder wall thickening, or biliary dilatation.

Pancreas: Unremarkable. No pancreatic ductal dilatation or
surrounding inflammatory changes.

Spleen: Normal in size without focal abnormality.

Adrenals/Urinary Tract: Adrenal glands are normal. Low-density
lesions in the bilateral kidneys are probably cysts but cannot be
further characterized without contrast. No hydronephrosis. The
bladder is normal

Stomach/Bowel: Possible thickening of the gastric cardia. No dilated
small bowel. Large stool burden. No acute bowel wall thickening.
Negative appendix.

Vascular/Lymphatic: Mild aortic atherosclerosis. No aneurysm. No
suspicious nodes

Reproductive: Calcified fibroid within the anterior uterine corpus.
No adnexal mass

Other: Negative for pelvic effusion or free air

Musculoskeletal: Degenerative changes.  No acute osseous abnormality
IMPRESSION: 1. No CT evidence for acute intra-abdominal or pelvic abnormality.
2. Possible thickening of the gastric cardia, suggest correlation
with nonemergent endoscopy to exclude mass.
3. Calcified uterine fibroid

## 2023-03-29 DIAGNOSIS — K5904 Chronic idiopathic constipation: Secondary | ICD-10-CM | POA: Diagnosis not present

## 2023-04-06 DIAGNOSIS — M25511 Pain in right shoulder: Secondary | ICD-10-CM | POA: Diagnosis not present

## 2023-04-06 DIAGNOSIS — S43431A Superior glenoid labrum lesion of right shoulder, initial encounter: Secondary | ICD-10-CM | POA: Diagnosis not present

## 2023-04-06 DIAGNOSIS — M129 Arthropathy, unspecified: Secondary | ICD-10-CM | POA: Diagnosis not present

## 2023-04-06 DIAGNOSIS — M7581 Other shoulder lesions, right shoulder: Secondary | ICD-10-CM | POA: Diagnosis not present

## 2023-05-06 DIAGNOSIS — M25511 Pain in right shoulder: Secondary | ICD-10-CM | POA: Diagnosis not present

## 2023-05-25 DIAGNOSIS — Z961 Presence of intraocular lens: Secondary | ICD-10-CM | POA: Diagnosis not present

## 2023-05-25 DIAGNOSIS — H524 Presbyopia: Secondary | ICD-10-CM | POA: Diagnosis not present

## 2023-05-25 DIAGNOSIS — H04123 Dry eye syndrome of bilateral lacrimal glands: Secondary | ICD-10-CM | POA: Diagnosis not present

## 2023-05-25 DIAGNOSIS — H43813 Vitreous degeneration, bilateral: Secondary | ICD-10-CM | POA: Diagnosis not present

## 2023-05-25 DIAGNOSIS — H52203 Unspecified astigmatism, bilateral: Secondary | ICD-10-CM | POA: Diagnosis not present

## 2023-07-28 ENCOUNTER — Ambulatory Visit: Admitting: Podiatry

## 2023-07-28 ENCOUNTER — Encounter: Payer: Self-pay | Admitting: Podiatry

## 2023-07-28 DIAGNOSIS — M205X1 Other deformities of toe(s) (acquired), right foot: Secondary | ICD-10-CM

## 2023-07-28 DIAGNOSIS — M7751 Other enthesopathy of right foot: Secondary | ICD-10-CM

## 2023-07-28 MED ORDER — TRIAMCINOLONE ACETONIDE 10 MG/ML IJ SUSP
10.0000 mg | Freq: Once | INTRAMUSCULAR | Status: AC
  Administered 2023-07-28: 10 mg via INTRA_ARTICULAR

## 2023-07-28 MED ORDER — MELOXICAM 15 MG PO TABS: 15.0000 mg | ORAL_TABLET | Freq: Every day | ORAL | 2 refills | Status: AC

## 2023-07-29 NOTE — Progress Notes (Signed)
 Subjective:   Patient ID: Lawrnce FORBES [name], female   DOB: 78 y.o.   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  I did explain this to her and at this point I carefully injected the first MPJ 3 mg Dexasone Kenalog  5 mg Xylocaine  periarticular sterile dressing applied and if improvement not forthcoming will need to consider fusion or implant

## 2023-09-01 ENCOUNTER — Other Ambulatory Visit: Payer: Self-pay | Admitting: Cardiology

## 2023-09-15 ENCOUNTER — Ambulatory Visit: Admitting: Podiatry

## 2023-09-15 DIAGNOSIS — M205X1 Other deformities of toe(s) (acquired), right foot: Secondary | ICD-10-CM | POA: Diagnosis not present

## 2023-09-15 DIAGNOSIS — M7751 Other enthesopathy of right foot: Secondary | ICD-10-CM

## 2023-09-15 NOTE — Progress Notes (Signed)
 Subjective:   Patient ID: Lawrnce FORBES [name], female   DOB: 78 y.o.   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  I educated her on this at this point organ to let her go try to wear his rigid bottom shoes as possible and will be seen back as symptoms indicate

## 2023-10-12 ENCOUNTER — Other Ambulatory Visit: Payer: Self-pay | Admitting: Obstetrics and Gynecology

## 2023-10-12 DIAGNOSIS — N6312 Unspecified lump in the right breast, upper inner quadrant: Secondary | ICD-10-CM

## 2023-10-14 DIAGNOSIS — I2510 Atherosclerotic heart disease of native coronary artery without angina pectoris: Secondary | ICD-10-CM | POA: Diagnosis not present

## 2023-10-14 DIAGNOSIS — M4310 Spondylolisthesis, site unspecified: Secondary | ICD-10-CM | POA: Diagnosis not present

## 2023-10-14 DIAGNOSIS — I700 Atherosclerosis of aorta: Secondary | ICD-10-CM | POA: Diagnosis not present

## 2023-10-14 DIAGNOSIS — E785 Hyperlipidemia, unspecified: Secondary | ICD-10-CM | POA: Diagnosis not present

## 2023-10-14 DIAGNOSIS — M4802 Spinal stenosis, cervical region: Secondary | ICD-10-CM | POA: Diagnosis not present

## 2023-10-14 DIAGNOSIS — M1990 Unspecified osteoarthritis, unspecified site: Secondary | ICD-10-CM | POA: Diagnosis not present

## 2023-10-14 DIAGNOSIS — E039 Hypothyroidism, unspecified: Secondary | ICD-10-CM | POA: Diagnosis not present

## 2023-10-14 DIAGNOSIS — K449 Diaphragmatic hernia without obstruction or gangrene: Secondary | ICD-10-CM | POA: Diagnosis not present

## 2023-10-14 DIAGNOSIS — K219 Gastro-esophageal reflux disease without esophagitis: Secondary | ICD-10-CM | POA: Diagnosis not present

## 2023-10-14 DIAGNOSIS — I509 Heart failure, unspecified: Secondary | ICD-10-CM | POA: Diagnosis not present

## 2023-10-22 ENCOUNTER — Ambulatory Visit
Admission: RE | Admit: 2023-10-22 | Discharge: 2023-10-22 | Disposition: A | Discharge status: Home / Self Care | Source: Ambulatory Visit | Attending: Obstetrics and Gynecology | Admitting: Obstetrics and Gynecology

## 2023-10-22 DIAGNOSIS — N6489 Other specified disorders of breast: Secondary | ICD-10-CM | POA: Diagnosis not present

## 2023-10-22 DIAGNOSIS — R921 Mammographic calcification found on diagnostic imaging of breast: Secondary | ICD-10-CM | POA: Diagnosis not present

## 2023-10-22 DIAGNOSIS — N6312 Unspecified lump in the right breast, upper inner quadrant: Secondary | ICD-10-CM

## 2023-10-25 DIAGNOSIS — Z23 Encounter for immunization: Secondary | ICD-10-CM | POA: Diagnosis not present

## 2023-11-23 ENCOUNTER — Encounter: Payer: Self-pay | Admitting: *Deleted

## 2023-11-24 ENCOUNTER — Ambulatory Visit: Attending: Cardiology | Admitting: Cardiology

## 2023-11-24 ENCOUNTER — Encounter: Payer: Self-pay | Admitting: Cardiology

## 2023-11-24 VITALS — BP 120/60 | HR 59 | Ht 66.0 in | Wt 196.6 lb

## 2023-11-24 DIAGNOSIS — I10 Essential (primary) hypertension: Secondary | ICD-10-CM

## 2023-11-24 DIAGNOSIS — E782 Mixed hyperlipidemia: Secondary | ICD-10-CM | POA: Diagnosis not present

## 2023-11-24 DIAGNOSIS — D229 Melanocytic nevi, unspecified: Secondary | ICD-10-CM

## 2023-11-24 DIAGNOSIS — I2510 Atherosclerotic heart disease of native coronary artery without angina pectoris: Secondary | ICD-10-CM

## 2023-11-24 NOTE — Progress Notes (Unsigned)
 Cardiology Office Note:    Date:  11/24/2023   ID:  Anna [name], DOB 10/06/1945, MRN [id number]  PCP:  Delana Ted Morrison, DO  Cardiologist:  Tyrianna Lightle, DO  Electrophysiologist:  None   Referring MD: Delana Ted Morrison, *    I am ok   History of Present Illness:    Anna [name] is a 78 y.o. female with a hx of CAD seen on [facility], hyperlipidemia, here for follow up.   She offers complaints of shortness of breath sometimes when climbing stairs. She reports that she walks at least 45 mins daily.   Past Medical History:  Diagnosis Date   Arthritis    Breast cancer [facility])    age 66   Family history of adverse reaction to anesthesia    Son takes longer to wake   Headache    Hyperlipidemia    Osteopenia    Vision abnormalities     Past Surgical History:  Procedure Laterality Date   BACK SURGERY     lumbar region   BUNIONECTOMY Bilateral    MASTECTOMY Left    at age 39   TOTAL HIP ARTHROPLASTY Right 04/22/2021   Procedure: Right TOTAL HIP ARTHROPLASTY ANTERIOR APPROACH;  Surgeon: Vernetta Lonni GRADE, MD;  Location: [facility] OR;  Service: Orthopedics;  Laterality: Right;   TRIGGER FINGER RELEASE Left    left thumb   TUBAL LIGATION      Current Medications: Current Meds  Medication Sig   Acetaminophen  500 MG capsule Take 500-1,000 mg by mouth daily. Take 1000 mg in the morning and 500 mg at bedtime   ASPERCREME LIDOCAINE  EX Apply 1 application. topically daily as needed (Pain).   aspirin  EC 81 MG tablet Take 1 tablet (81 mg total) by mouth daily. Swallow whole.   Calcium  600-200 MG-UNIT tablet Take 2 tablets by mouth daily.   Cholecalciferol  (VITAMIN D -3) 125 MCG (5000 UT) TABS Take 5,000 Units by mouth daily.   famotidine  (PEPCID ) 40 MG tablet Take 40 mg by mouth at bedtime.   levothyroxine  (SYNTHROID ) 100 MCG tablet Take 100 mcg by mouth daily.   lidocaine  (LIDODERM ) 5 % Place 1 patch onto the skin daily. Remove & Discard patch within 12 hours or as directed  by MD   LINZESS 145 MCG CAPS capsule Take 145 mcg by mouth every morning.   meloxicam  (MOBIC ) 15 MG tablet Take 1 tablet (15 mg total) by mouth daily.   omeprazole  (PRILOSEC) 20 MG capsule Take 1 capsule (20 mg total) by mouth daily.   OVER THE COUNTER MEDICATION Place 1 drop into both eyes daily as needed (Dry eye). Dry eye formula   rosuvastatin  (CRESTOR ) 5 MG tablet Take 1 tablet (5 mg total) by mouth daily.   SUMAtriptan  (IMITREX ) 25 MG tablet Take 1 tab at onset of migraine.  May repeat in 2 hrs, if needed.  Max dose: 2 tabs/day. This is a 30 day prescription.     Allergies:   Septra [sulfamethoxazole-trimethoprim] and Sulfa antibiotics   Social History   Socioeconomic History   Marital status: Widowed    Spouse name: Not on file   Number of children: 2   Years of education: Not on file   Highest education level: Not on file  Occupational History   Not on file  Tobacco Use   Smoking status: Never   Smokeless tobacco: Never  Vaping Use   Vaping status: Never Used  Substance and Sexual Activity   Alcohol use: Never  Drug use: Never   Sexual activity: Not Currently  Other Topics Concern   Not on file  Social History Narrative   Not on file   Social Drivers of Health   Financial Resource Strain: Not on file  Food Insecurity: Not on file  Transportation Needs: Not on file  Physical Activity: Not on file  Stress: Not on file  Social Connections: Not on file     Family History: The patient's family history includes Alzheimer's disease in her father; Hyperlipidemia in her mother; Hypertension in her brother, brother, mother, sister, sister, sister, and sister; Lung cancer in her brother. There is no history of Breast cancer.  ROS:   Review of Systems  Constitution: Negative for decreased appetite, fever and weight gain.  HENT: Negative for congestion, ear discharge, hoarse voice and sore throat.   Eyes: Negative for discharge, redness, vision loss in right eye and  visual halos.  Cardiovascular: Negative for chest pain, dyspnea on exertion, leg swelling, orthopnea and palpitations.  Respiratory: Negative for cough, hemoptysis, shortness of breath and snoring.   Endocrine: Negative for heat intolerance and polyphagia.  Hematologic/Lymphatic: Negative for bleeding problem. Does not bruise/bleed easily.  Skin: Negative for flushing, nail changes, rash and suspicious lesions.  Musculoskeletal: Negative for arthritis, joint pain, muscle cramps, myalgias, neck pain and stiffness.  Gastrointestinal: Negative for abdominal pain, bowel incontinence, diarrhea and excessive appetite.  Genitourinary: Negative for decreased libido, genital sores and incomplete emptying.  Neurological: Negative for brief paralysis, focal weakness, headaches and loss of balance.  Psychiatric/Behavioral: Negative for altered mental status, depression and suicidal ideas.  Allergic/Immunologic: Negative for HIV exposure and persistent infections.    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 Compared to prior ECG no significant change.    Transthoracic echocardiogram August 2022 IMPRESSIONS   1. Left ventricular ejection fraction, by estimation, is 60 to 65%. The left ventricle has normal function. The left ventricle has no regional wall motion abnormalities. There is mild left ventricular hypertrophy.  Left ventricular diastolic parameters  are consistent with Grade I diastolic dysfunction (impaired relaxation).   2. Right ventricular systolic function is normal. The right ventricular  size is normal. There is normal pulmonary artery systolic pressure. The  estimated right ventricular systolic pressure is 29.8 mmHg.   3. Left atrial size was mildly dilated.   4. The mitral valve is normal in structure. Trivial mitral valve   regurgitation. No evidence of mitral stenosis.   5. The aortic valve is tricuspid. Aortic valve regurgitation is not  visualized. No aortic stenosis is present.   6. The inferior vena cava is normal in size with greater than 50%  respiratory variability, suggesting right atrial pressure of 3 mmHg.   FINDINGS   Left Ventricle: Left ventricular ejection fraction, by estimation, is 60  to 65%. The left ventricle has normal function. The left ventricle has no  regional wall motion abnormalities. The left ventricular internal cavity  size was normal in size. There is   mild left ventricular hypertrophy. Left ventricular diastolic parameters  are consistent with Grade I diastolic dysfunction (impaired relaxation).   Right Ventricle: The right ventricular size is normal. No increase in  right ventricular wall thickness. Right ventricular systolic function is  normal. There is normal pulmonary artery systolic pressure. The tricuspid  regurgitant velocity is 2.59 m/s, and  with an assumed right atrial pressure of 3 mmHg, the estimated right  ventricular systolic pressure is 29.8 mmHg.   Left Atrium: Left atrial size was mildly dilated.   Right Atrium: Right atrial size was normal in size.   Pericardium: Trivial pericardial effusion is present.   Mitral Valve: The mitral valve is normal in structure. Trivial mitral  valve regurgitation. No evidence of mitral valve stenosis.   Tricuspid Valve: The tricuspid valve is normal in structure. Tricuspid  valve regurgitation is mild . No evidence of tricuspid stenosis.   Aortic Valve: The aortic valve is tricuspid. Aortic valve regurgitation is  not visualized. No aortic stenosis is present.   Pulmonic Valve: The pulmonic valve was normal in structure. Pulmonic valve  regurgitation is trivial. No evidence of pulmonic stenosis.   Aorta: The aortic root is normal in size and structure.   Venous: The inferior vena cava is normal in size with  greater than 50%  respiratory variability, suggesting right atrial pressure of 3 mmHg.     Nuclear stress test Nuclear stress EF: 57%. The left ventricular ejection fraction is normal (55-65%). This is a low risk study. There is no evidence of ischemia and no evidence of previous infarction. The study is normal. Recent Labs: No results found for requested labs within last 365 days.  Recent Lipid Panel    Component Value Date/Time   CHOL 145 05/08/2022 0936   TRIG 96 05/08/2022 0936   HDL 58 05/08/2022 0936   CHOLHDL 2.5 05/08/2022 0936   LDLCALC 69 05/08/2022 0936    Physical Exam:    VS:  There were no vitals taken for this visit.    Wt Readings from Last 3 Encounters:  01/20/23 209 lb (94.8 kg)  11/17/22 209 lb 6.4 oz (95 kg)  05/08/22 195 lb 3.2 oz (88.5 kg)     GEN: Well nourished, well developed in no acute distress HEENT: Normal NECK: No JVD; No carotid bruits LYMPHATICS: No lymphadenopathy CARDIAC: S1S2 noted,RRR, no murmurs, rubs, gallops RESPIRATORY:  Clear to auscultation without rales, wheezing or rhonchi  ABDOMEN: Soft, non-tender, non-distended, +bowel sounds, no guarding. EXTREMITIES: No edema, No cyanosis, no clubbing MUSCULOSKELETAL:  No deformity  SKIN: Warm and dry NEUROLOGIC:  Alert and oriented x 3, non-focal PSYCHIATRIC:  Normal affect, good insight  ASSESSMENT:    1. Primary hypertension    PLAN:    Coronary Artery Disease - no symptoms of angina. Continue Aspirin  81 mg daily and Crestor  5 mg daily.   Hypertension - blood pressure at target and not currently on any antihypertensives.    Hyperlipidemia LDL previously 69, target <29. Continued Crestor  for primary prevention. - Continue Crestor . - Request lab work from primary care provider to monitor lipid profile. - Ensure LDL remains below 70.  Atypical mole - growing in size, will refer to dermatology  Follow-up in 1 year or sooner if any issues arise.  The patient understands the  need to lose weight with diet and exercise. We have discussed specific strategies for this.   The patient is in agreement with the above plan. The patient left the office in stable condition.  The patient will follow up in   Medication Adjustments/Labs and Tests Ordered: Current medicines are reviewed at length with the patient today.  Concerns regarding medicines are outlined above.  Orders Placed This Encounter  Procedures   EKG 12-Lead   No orders of the defined types were placed in this encounter.   There are no  Patient Instructions on file for this visit.   Adopting a Healthy Lifestyle.  Know what a healthy weight is for you (roughly BMI <25) and aim to maintain this   Aim for 7+ servings of fruits and vegetables daily   65-80+ fluid ounces of water or unsweet tea for healthy kidneys   Limit to max 1 drink of alcohol per day; avoid smoking/tobacco   Limit animal fats in diet for cholesterol and heart health - choose grass fed whenever available   Avoid highly processed foods, and foods high in saturated/trans fats   Aim for low stress - take time to unwind and care for your mental health   Aim for 150 min of moderate intensity exercise weekly for heart health, and weights twice weekly for bone health   Aim for 7-9 hours of sleep daily   When it comes to diets, agreement about the perfect plan isnt easy to find, even among the experts. Experts at the [facility] of [employer] developed an idea known as the Healthy Eating Plate. Just imagine a plate divided into logical, healthy portions.   The emphasis is on diet quality:   Load up on vegetables and fruits - one-half of your plate: Aim for color and variety, and remember that potatoes dont count.   Go for whole grains - one-quarter of your plate: Whole wheat, barley, wheat berries, quinoa, oats, brown rice, and foods made with them. If you want pasta, go with whole wheat pasta.   Protein power - one-quarter  of your plate: Fish, chicken, beans, and nuts are all healthy, versatile protein sources. Limit red meat.   The diet, however, does go beyond the plate, offering a few other suggestions.   Use healthy plant oils, such as olive, canola, soy, corn, sunflower and peanut. Check the labels, and avoid partially hydrogenated oil, which have unhealthy trans fats.   If youre thirsty, drink water. Coffee and tea are good in moderation, but skip sugary drinks and limit milk and dairy products to one or two daily servings.   The type of carbohydrate in the diet is more important than the amount. Some sources of carbohydrates, such as vegetables, fruits, whole grains, and beans-are healthier than others.   Finally, stay active  Signed, Dub Huntsman, DO  11/24/2023 9:21 AM    [facility]

## 2023-11-24 NOTE — Patient Instructions (Signed)

## 2023-11-26 DIAGNOSIS — E559 Vitamin D deficiency, unspecified: Secondary | ICD-10-CM | POA: Diagnosis not present

## 2023-11-26 DIAGNOSIS — R7303 Prediabetes: Secondary | ICD-10-CM | POA: Diagnosis not present

## 2023-11-26 DIAGNOSIS — Z0000 Encounter for general adult medical examination without abnormal findings: Secondary | ICD-10-CM | POA: Diagnosis not present

## 2023-11-26 DIAGNOSIS — E039 Hypothyroidism, unspecified: Secondary | ICD-10-CM | POA: Diagnosis not present

## 2023-11-26 DIAGNOSIS — D509 Iron deficiency anemia, unspecified: Secondary | ICD-10-CM | POA: Diagnosis not present

## 2023-11-26 LAB — LAB REPORT - SCANNED
A1c: 5.7
EGFR: 61

## 2023-12-06 ENCOUNTER — Telehealth: Payer: Self-pay | Admitting: Cardiology

## 2023-12-06 MED ORDER — ROSUVASTATIN CALCIUM 5 MG PO TABS: 5.0000 mg | ORAL_TABLET | Freq: Every day | ORAL | 3 refills | Status: AC

## 2023-12-06 NOTE — Telephone Encounter (Signed)
 Refill for Rosuvastatin  has been sent to [facility].

## 2023-12-06 NOTE — Telephone Encounter (Signed)
*  STAT* If patient is at the pharmacy, call can be transferred to refill team.   1. Which medications need to be refilled? (please list name of each medication and dose if known)   rosuvastatin (CRESTOR) 5 MG tablet    2. Which pharmacy/location (including street and city if local pharmacy) is medication to be sent to?  [facility] - [place], [place] - [address]      3. Do they need a 30 day or 90 day supply? 90 day

## 2024-02-23 ENCOUNTER — Ambulatory Visit: Admitting: Orthopaedic Surgery

## 2024-02-23 DIAGNOSIS — M25562 Pain in left knee: Secondary | ICD-10-CM | POA: Diagnosis not present

## 2024-02-23 DIAGNOSIS — G8929 Other chronic pain: Secondary | ICD-10-CM | POA: Diagnosis not present

## 2024-02-23 DIAGNOSIS — M1712 Unilateral primary osteoarthritis, left knee: Secondary | ICD-10-CM

## 2024-02-23 MED ORDER — METHYLPREDNISOLONE ACETATE 40 MG/ML IJ SUSP
40.0000 mg | INTRAMUSCULAR | Status: AC | PRN
  Administered 2024-02-23: 40 mg via INTRA_ARTICULAR

## 2024-02-23 MED ORDER — LIDOCAINE HCL 1 % IJ SOLN
3.0000 mL | INTRAMUSCULAR | Status: AC | PRN
  Administered 2024-02-23: 3 mL

## 2024-02-23 NOTE — Progress Notes (Signed)
 The patient is an active 79 year old female that we last saw about a year and a half ago with left knee pain.  She does have known arthritis in her left knee and she been hurting on the medial aspect of her knee but also over the pes bursa area where she points.  She walks about 5 days a week as well mainly outside but sometimes she has to walk inside depending on the cold and the weather.  She denies any acute change in her medical status otherwise.  On exam her left knee has some slight varus malalignment.  There is pain over the pes bursa but also the medial joint line and some patellofemoral crepitation.  The knee feels stable ligamentously.  Again previous x-rays showed an arthritic left knee.  Her hip exam on that left side is entirely normal with good range of motion and only some slight pain over the trochanteric area which is likely related to her knee.  We did discuss a steroid injection today in both the left knee and in the left pes bursa area since she is not a diabetic and both these areas are tender.  She agreed to these injections and tolerated them well.  Follow-up can be as needed.  If things worsen she knows to reach out and let us  know.     Procedure Note  Patient: Anna [name]             Date of Birth: Dec 04, 1945           MRN: [id number]             Visit Date: 02/23/2024  Procedures: Visit Diagnoses:  1. Chronic pain of left knee   2. Unilateral primary osteoarthritis, left knee     Large Joint Inj: R knee on 02/23/2024 10:50 AM Indications: diagnostic evaluation and pain Details: 22 G 1.5 in needle, superolateral approach  Arthrogram: No  Medications: 3 mL lidocaine  1 %; 40 mg methylPREDNISolone  acetate 40 MG/ML Outcome: tolerated well, no immediate complications Procedure, treatment alternatives, risks and benefits explained, specific risks discussed. Consent was given by the patient. Immediately prior to procedure a time out was called to verify the correct  patient, procedure, equipment, support staff and site/side marked as required. Patient was prepped and draped in the usual sterile fashion.

## 2024-07-04 ENCOUNTER — Ambulatory Visit: Admitting: Dermatology
